# Patient Record
Sex: Female | Born: 1960 | Race: White | Hispanic: No | Marital: Married | Smoking: Never smoker
Health system: Southern US, Community
[De-identification: ages and names within clinical notes are randomized; demographics above are authoritative.]

## PROBLEM LIST (undated history)

## (undated) ENCOUNTER — Emergency Department (HOSPITAL_COMMUNITY): Admission: EM | Payer: 59 | Source: Home / Self Care

## (undated) DIAGNOSIS — J45909 Unspecified asthma, uncomplicated: Secondary | ICD-10-CM

## (undated) HISTORY — PX: ABLATION: SHX5711

## (undated) HISTORY — DX: Unspecified asthma, uncomplicated: J45.909

---

## 2002-08-11 ENCOUNTER — Inpatient Hospital Stay (HOSPITAL_COMMUNITY): Admission: AD | Admit: 2002-08-11 | Discharge: 2002-08-14 | Payer: Self-pay | Admitting: Obstetrics and Gynecology

## 2002-09-16 ENCOUNTER — Other Ambulatory Visit: Admission: RE | Admit: 2002-09-16 | Discharge: 2002-09-16 | Payer: Self-pay | Admitting: Obstetrics and Gynecology

## 2003-11-23 ENCOUNTER — Other Ambulatory Visit: Admission: RE | Admit: 2003-11-23 | Discharge: 2003-11-23 | Payer: Self-pay | Admitting: Obstetrics and Gynecology

## 2004-12-05 ENCOUNTER — Other Ambulatory Visit: Admission: RE | Admit: 2004-12-05 | Discharge: 2004-12-05 | Payer: Self-pay | Admitting: Obstetrics and Gynecology

## 2009-05-23 ENCOUNTER — Encounter: Admission: RE | Admit: 2009-05-23 | Discharge: 2009-05-23 | Payer: Self-pay | Admitting: Orthopedic Surgery

## 2010-07-27 NOTE — H&P (Signed)
NAME:  Taylor Sharp, Taylor Sharp                          ACCOUNT NO.:  [id number]   MEDICAL RECORD NO.:  [id number]                   PATIENT TYPE:  INP   LOCATION:  NA                                   FACILITY:  [facility]   PHYSICIAN:  Tracie Harrier, M.D.              DATE OF BIRTH:  03-16-1960   DATE OF ADMISSION:  08/11/2002  DATE OF DISCHARGE:                                HISTORY & PHYSICAL   HISTORY OF PRESENT ILLNESS:  The patient is a 50 year old female gravida 3  para 2 at 53 and one-seventh weeks gestation.  She is admitted for elective  induction of labor at term.  The patient's first pregnancy was complicated  by forceps delivery with her first baby in 49.  Due to this, ultrasound  was obtained at 38 weeks which showed a baby weighing 3600 grams.  Vertex  presentation was noted.  She is now admitted for induction of labor as  mentioned.   Her pregnancy was otherwise uneventful except for advanced maternal age.  She underwent an amniocentesis in the second trimester which was normal,  showing normal female chromosomes.   OBSTETRICAL LABORATORY DATA:  Maternal blood type O positive.  Rubella  immune.  Group B strep negative.  Glucola 79.   OBSTETRICAL HISTORY:  1. In 1989 a normal spontaneous vaginal delivery at term with forceps     delivery necessary, a female weighing 7 pounds 6 ounces.  2. In 1992 a normal spontaneous vaginal delivery at term, a female weighing     8 pounds 7 ounces.   SURGICAL HISTORY:  None.   MEDICAL HISTORY:  1. History of asthma - p.r.n. inhalers.  2. History of fractured pelvis age 63 - no surgery.   CURRENT MEDICATIONS:  Prenatal vitamins.   ALLERGIES:  None known.   PHYSICAL EXAMINATION:  VITAL SIGNS:  Stable, blood pressure 100/60,  temperature 97.8, fetal heart tones 140.  GENERAL:  She is a well-developed, well-nourished female in no acute  distress.  HEENT:  Within normal limits.  NECK:  Supple without adenopathy or thyromegaly.  HEART:  Regular rate and rhythm without murmur, gallop, or rub.  LUNGS:  Clear to auscultation.  BREAST:  Deferred.  ABDOMEN:  Soft and benign without masses, tenderness, or organomegaly.  A  gravid fundus is noted.  EXTREMITIES AND NEUROLOGIC:  Grossly normal.  PELVIC:  Last cervical exam:  Closed cervix, vertex presentation.   ADMITTING DIAGNOSES:  1. Intrauterine pregnancy at term.  2. Elective induction of labor.   PLAN:  1. Serial induction of labor.  2. Expect normal spontaneous vaginal delivery.  3. Postpartum tubal ligation after delivery at the patient's request.  Tracie Harrier, M.D.    REG/MEDQ  D:  08/11/2002  T:  08/11/2002  Job:  [id number]

## 2010-07-27 NOTE — Op Note (Signed)
   Taylor Sharp, Taylor Sharp                          ACCOUNT NO.:  [id number]   MEDICAL RECORD NO.:  [id number]                   PATIENT TYPE:  INP   LOCATION:  9123                                 [facility]ILITY:  [facility]   PHYSICIAN:  Duke Salvia. Marcelle Overlie, M.D.            DATE OF BIRTH:  Dec 22, 1960   DATE OF PROCEDURE:  08/12/2002  DATE OF DISCHARGE:                                 OPERATIVE REPORT   DELIVERY NOTE:  This patient had good progress to complete, stable FHR  throughout the second stage.  After pushing an hour and 15 minutes, was  noted to be +2.  The patient requested assistance due to exhaustion.  She  had good perineal epidural anesthesia, the bladder had been drained  previously, and the head was crowning.  The Kiwi vacuum was applied, two  traction efforts coordinated with maternal pushing to effect easy delivery  over a midline episiotomy.  The infant was suctioned, the cord clamped, and  Apgars were 9 and 9.  The infant was placed on the mother's abdomen.  The  placenta delivered spontaneously intact.  EBL was 300 mL.  The midline  episiotomy was repaired in a standard layered fashion with 3-0 Vicryl Rapide  sutures.  Mother and baby doing well at that point.                                               Richard M. Marcelle Overlie, M.D.    RMH/MEDQ  D:  08/12/2002  T:  08/13/2002  Job:  [id number]

## 2014-05-24 ENCOUNTER — Telehealth: Payer: Self-pay | Admitting: Cardiovascular Disease

## 2014-05-24 NOTE — Telephone Encounter (Signed)
Phone call from [facility],  She has been having some pinching chest pain for the past day. Not severe, Does not radiate, no shortness.  She is anxious about this + family hx of CAD Will see her tomorrow at 8:30 ECG. She may need lab work   Thayer Headings, Brooke Bonito., MD, [facility] 05/24/2014, 3:53 PM [address]  [place] Pager [phone]

## 2014-05-25 ENCOUNTER — Encounter: Payer: Self-pay | Admitting: Cardiovascular Disease

## 2014-05-25 ENCOUNTER — Ambulatory Visit (INDEPENDENT_AMBULATORY_CARE_PROVIDER_SITE_OTHER): Payer: BLUE CROSS/BLUE SHIELD | Admitting: Cardiovascular Disease

## 2014-05-25 ENCOUNTER — Telehealth: Payer: Self-pay | Admitting: Cardiovascular Disease

## 2014-05-25 VITALS — BP 112/78 | HR 70 | Ht 69.0 in | Wt 134.6 lb

## 2014-05-25 DIAGNOSIS — R0789 Other chest pain: Secondary | ICD-10-CM

## 2014-05-25 DIAGNOSIS — R5383 Other fatigue: Secondary | ICD-10-CM

## 2014-05-25 LAB — BASIC METABOLIC PANEL
BUN: 12 mg/dL (ref 6–23)
CO2: 27 mEq/L (ref 19–32)
Calcium: 9.2 mg/dL (ref 8.4–10.5)
Chloride: 107 mEq/L (ref 96–112)
Creatinine, Ser: 0.8 mg/dL (ref 0.40–1.20)
GFR: 79.68 mL/min (ref >60.00)
Glucose, Bld: 91 mg/dL (ref 70–99)
POTASSIUM: 4.1 meq/L (ref 3.5–5.1)
SODIUM: 139 meq/L (ref 135–145)

## 2014-05-25 LAB — LIPID PANEL
CHOL/HDL RATIO: 2
Cholesterol: 165 mg/dL (ref 0–200)
HDL: 67.2 mg/dL (ref >39.00)
LDL CALC: 85 mg/dL (ref 0–99)
NonHDL: 97.8
Triglycerides: 65 mg/dL (ref 0.0–149.0)
VLDL: 13 mg/dL (ref 0.0–40.0)

## 2014-05-25 LAB — HEPATIC FUNCTION PANEL
ALBUMIN: 4.2 g/dL (ref 3.5–5.2)
ALK PHOS: 45 U/L (ref 39–117)
ALT: 10 U/L (ref 0–35)
AST: 14 U/L (ref 0–37)
BILIRUBIN DIRECT: 0.2 mg/dL (ref 0.0–0.3)
TOTAL PROTEIN: 6.6 g/dL (ref 6.0–8.3)
Total Bilirubin: 1 mg/dL (ref 0.2–1.2)

## 2014-05-25 LAB — TSH: TSH: 2.04 u[IU]/mL (ref 0.35–4.50)

## 2014-05-25 NOTE — Telephone Encounter (Signed)
Troponin result was not available with other lab results; spoke with Freda Munro at [facility] main lab, spoke with Randell Loop, and [facility] at [place] and determined that sample was lost  Left message for patient that all lab results are normal except one sample that needs to be recollected and advised patient that I will call her tomorrow.

## 2014-05-25 NOTE — Progress Notes (Signed)
Cardiology Office Note   Date:  05/25/2014   ID:  Taylor Sharp, DOB 1961/01/08, MRN [id number]  PCP:  No primary care provider on file.  Cardiologist:   Margrett Kalb, Wonda Cheng, MD   Chief Complaint  Patient presents with  . Chest Pain   1. Chest pain   History of Present Illness: Taylor Sharp is a 54 y.o. female who presents for chest discomfort . Pearle started having chest pain 1-2 days ago.  The pain is a constant pinching pain, now its become a pressure.   Not related to exertion, taking a deep breath, movement. She was feeling poorly about 6 days .  Occurred after accupuncture.  Lots of stress at work and home. Slight cough, no fever No sputum .  Has continued to walk - does not affect the chest pressure Shes had some fatigue.     She works for [employer], broadcasting/film/video) Comes and goes slightly.   Has not tried motrin. Mild shortness of breath.  No sweats.    It is not associatted with any shortness of breath, diaphoresis, or dizziness     History reviewed. No pertinent past medical history.  Past Surgical History  Procedure Laterality Date  . Ablation       Current Outpatient Prescriptions  Medication Sig Dispense Refill  . Norethindrone Acetate-Ethinyl Estrad-FE (LOESTRIN 24 FE) 1-20 MG-MCG(24) tablet Take 1 tablet by mouth daily.     No current facility-administered medications for this visit.    Allergies:   Codeine and Prednisone    Social History:  The patient  reports that she has never smoked. She does not have any smokeless tobacco history on file. She reports that she drinks alcohol. She reports that she does not use illicit drugs.   Family History:  The patient's family history includes Breast cancer in her mother; CVA in her maternal grandmother; Heart attack in her father and paternal grandfather; Heart disease in her father; Hypertension in her mother.    ROS:  Please see the history of present illness.    Review of  Systems: Constitutional:  denies fever, chills, diaphoresis, appetite change and fatigue.  HEENT: denies photophobia, eye pain, redness, hearing loss, ear pain, congestion, sore throat, rhinorrhea, sneezing, neck pain, neck stiffness and tinnitus.  Respiratory: denies SOB, DOE, cough, chest tightness, and wheezing.  Cardiovascular: admits to chest pain,    Gastrointestinal: denies nausea, vomiting, abdominal pain, diarrhea, constipation, blood in stool.  Genitourinary: denies dysuria, urgency, frequency, hematuria, flank pain and difficulty urinating.  Musculoskeletal: denies  myalgias, back pain, joint swelling, arthralgias and gait problem.   Skin: denies pallor, rash and wound.  Neurological: denies dizziness, seizures, syncope, weakness, light-headedness, numbness and headaches.   Hematological: denies adenopathy, easy bruising, personal or family bleeding history.  Psychiatric/ Behavioral: denies suicidal ideation, mood changes, confusion, nervousness, sleep disturbance and agitation.       All other systems are reviewed and negative.    PHYSICAL EXAM: VS:  BP 112/78 mmHg  Pulse 70  Ht 5\' 9"  (1.753 m)  Wt 134 lb 9.6 oz (61.054 kg)  BMI 19.87 kg/m2 , BMI Body mass index is 19.87 kg/(m^2). GEN: Well nourished, well developed, in no acute distress HEENT: normal Neck: no JVD, carotid bruits, or masses Cardiac: RRR; no murmurs, rubs, or gallops,no edema , mild - moderate left sided chest wall tenderness  Respiratory:  clear to auscultation bilaterally, normal work of breathing GI: soft, nontender, nondistended, + BS MS: no deformity or  atrophy Skin: warm and dry, no rash Neuro:  Strength and sensation are intact Psych: normal   EKG:  EKG is ordered today. The ekg ordered today demonstrates NSR at 70, Right axis deviation    Recent Labs: No results found for requested labs within last 365 days.    Lipid Panel No results found for: CHOL, TRIG, HDL, CHOLHDL, VLDL, LDLCALC,  LDLDIRECT    Wt Readings from Last 3 Encounters:  05/25/14 134 lb 9.6 oz (61.054 kg)      Other studies Reviewed: Additional studies/ records that were reviewed today include: . Review of the above records demonstrates:    ASSESSMENT AND PLAN:  1.  Chest discomfort - She has left-sided chest wall tenderness. Her EKG is unremarkable. I think there is a low likelihood that this is acute coronary syndrome. As a precaution we will check a troponin level.  Will also check fasting lipids, liver enzymes, and basic metabolic profiles that she has a family history of premature coronary artery disease.  2. Fatigue - will check TSH , encouraged her to exercise   Current medicines are reviewed at length with the patient today.  The patient does not have concerns regarding medicines.  The following changes have been made:  no change  Labs/ tests ordered today include:  No orders of the defined types were placed in this encounter.     Disposition:   FU with me in 2 weeks.    Signed, Yumalay Circle, Wonda Cheng, MD  05/25/2014 9:20 AM    [facility] [place], [place], [place]  [address] Phone: [phone]; Fax: [phone]

## 2014-05-25 NOTE — Telephone Encounter (Signed)
New msg     Pt calling to see if lab results are available at this time.  Please return call.

## 2014-05-25 NOTE — Patient Instructions (Addendum)
Your physician recommends that you continue on your current medications as directed. Please refer to the Current Medication list given to you today.  Your physician recommends that you schedule a follow-up appointment in: 2 weeks - March 29 9:30 am  Your physician recommends that you have lab work:  TODAY - BMET, TSH, Troponin, LFTs, cholesterol

## 2014-05-26 ENCOUNTER — Telehealth: Payer: Self-pay | Admitting: *Deleted

## 2014-05-26 ENCOUNTER — Other Ambulatory Visit: Payer: BLUE CROSS/BLUE SHIELD | Admitting: *Deleted

## 2014-05-26 LAB — TROPONIN I

## 2014-05-26 NOTE — Telephone Encounter (Signed)
Spoke with patient and reviewed lab results with her. I explained that the troponin level was not completed. We are not certain as to why the level was not run; order was placed STAT for [facility] lab and drawn by Robert Bellow, phlebotomist here at [facility] and he reports was picked up by a carrier sometime after 10:00 am.  Yesterday afternoon, we were unable to identify why the sample was not processed.  Patient verbalized understanding and agreement to go to [facility] lab and get troponin level redrawn.  I have spoken with Shirlean Mylar, field agent for [employer] who is investigating the situation.

## 2014-05-26 NOTE — Telephone Encounter (Signed)
F/U       Pt would like to know if she is supposed to come in for additional labs today?    No order reflecting at this time.   Please return call.

## 2014-05-26 NOTE — Telephone Encounter (Signed)
Jessica from [facility] called to report that the Troponin I result is in = < 0.01. Dr. Acie Fredrickson notified and reviewed.

## 2014-05-26 NOTE — Addendum Note (Signed)
Addended by: Eulis Foster on: 05/26/2014 08:39 AM   Modules accepted: Orders

## 2014-05-30 ENCOUNTER — Telehealth: Payer: Self-pay | Admitting: Cardiovascular Disease

## 2014-05-30 NOTE — Telephone Encounter (Signed)
Spoke with patient and explained that no further tests ordered at this time.  Patient verbalized understanding and appreciation for the call

## 2014-05-30 NOTE — Telephone Encounter (Signed)
New Message    Patient would like to know if she needs to come in early for any kind of testing that may need to be done before the office visit on 06/07/14 @ 930am, please give patient a call back.  Thanks

## 2014-06-07 ENCOUNTER — Encounter: Payer: Self-pay | Admitting: Cardiovascular Disease

## 2014-06-07 ENCOUNTER — Ambulatory Visit (INDEPENDENT_AMBULATORY_CARE_PROVIDER_SITE_OTHER): Payer: BLUE CROSS/BLUE SHIELD | Admitting: Cardiovascular Disease

## 2014-06-07 VITALS — BP 112/72 | HR 84 | Ht 69.0 in | Wt 134.0 lb

## 2014-06-07 DIAGNOSIS — R0789 Other chest pain: Secondary | ICD-10-CM

## 2014-06-07 NOTE — Progress Notes (Signed)
Cardiology Office Note   Date:  06/07/2014   ID:  Taylor Sharp, DOB 1960-05-23, MRN [id number]  PCP:  No primary care provider on file.  Cardiologist:   Jett Kulzer, Wonda Cheng, MD   Chief Complaint  Patient presents with  . Follow-up    chest pain , palpitations   1. Chest pain   History of Present Illness: Taylor Sharp is a 54 y.o. female who presents for chest discomfort . Lakitha started having chest pain 1-2 days ago.  The pain is a constant pinching pain, now its become a pressure.   Not related to exertion, taking a deep breath, movement. She was feeling poorly about 6 days .  Occurred after accupuncture.  Lots of stress at work and home. Slight cough, no fever No sputum .  Has continued to walk - does not affect the chest pressure Shes had some fatigue.     She works for [employer], broadcasting/film/video) Comes and goes slightly.   Has not tried motrin. Mild shortness of breath.  No sweats.    It is not associatted with any shortness of breath, diaphoresis, or dizziness  June 07, 2014:  Taylor Sharp returns today for follow up of her episodes of CP. She is still having CP.  She is having some pleuretic CP.  Tried to exercise at the gym -  Did light weights -   No past medical history on file.  Past Surgical History  Procedure Laterality Date  . Ablation       Current Outpatient Prescriptions  Medication Sig Dispense Refill  . ALPRAZolam (XANAX) 0.5 MG tablet Take 0.5 mg by mouth 3 (three) times daily as needed. 1/2 OR 1 TABLET THREE TIMES DAILY BY MOUTH AS NEEDED  0  . azithromycin (ZITHROMAX) 250 MG tablet Take by mouth daily. TAKE BY MOUTH DAILY UNTIL COMPLETED  0  . Norethindrone Acetate-Ethinyl Estrad-FE (LOESTRIN 24 FE) 1-20 MG-MCG(24) tablet Take 1 tablet by mouth daily.     No current facility-administered medications for this visit.    Allergies:   Codeine and Prednisone    Social History:  The patient  reports that she has never smoked. She does  not have any smokeless tobacco history on file. She reports that she drinks alcohol. She reports that she does not use illicit drugs.   Family History:  The patient's family history includes Breast cancer in her mother; CVA in her maternal grandmother; Heart attack in her father and paternal grandfather; Heart disease in her father; Hypertension in her mother.    ROS:  Please see the history of present illness.    Review of Systems: Constitutional:  denies fever, chills, diaphoresis, appetite change and fatigue.  HEENT: denies photophobia, eye pain, redness, hearing loss, ear pain, congestion, sore throat, rhinorrhea, sneezing, neck pain, neck stiffness and tinnitus.  Respiratory: denies SOB, DOE, cough, chest tightness, and wheezing.  Cardiovascular: admits to chest pain,    Gastrointestinal: denies nausea, vomiting, abdominal pain, diarrhea, constipation, blood in stool.  Genitourinary: denies dysuria, urgency, frequency, hematuria, flank pain and difficulty urinating.  Musculoskeletal: denies  myalgias, back pain, joint swelling, arthralgias and gait problem.   Skin: denies pallor, rash and wound.  Neurological: denies dizziness, seizures, syncope, weakness, light-headedness, numbness and headaches.   Hematological: denies adenopathy, easy bruising, personal or family bleeding history.  Psychiatric/ Behavioral: denies suicidal ideation, mood changes, confusion, nervousness, sleep disturbance and agitation.       All other systems are reviewed and negative.  PHYSICAL EXAM: VS:  BP 112/72 mmHg  Pulse 84  Ht 5\' 9"  (1.753 m)  Wt 134 lb (60.782 kg)  BMI 19.78 kg/m2  SpO2 98% , BMI Body mass index is 19.78 kg/(m^2). GEN: Well nourished, well developed, in no acute distress HEENT: normal Neck: no JVD, carotid bruits, or masses Cardiac: RRR; no murmurs, rubs, or gallops,no edema , mild - moderate left sided chest wall tenderness , no specific point tenderness.  Respiratory:  clear  to auscultation bilaterally, normal work of breathing GI: soft, nontender, nondistended, + BS MS: no deformity or atrophy Skin: warm and dry, no rash Neuro:  Strength and sensation are intact Psych: normal   EKG:  EKG is not ordered today. The ekg ordered previously demonstrates NSR at 70, Right axis deviation    Recent Labs: 05/25/2014: ALT 10; BUN 12; Creatinine 0.80; Potassium 4.1; Sodium 139; TSH 2.04    Lipid Panel    Component Value Date/Time   CHOL 165 05/25/2014 0951   TRIG 65.0 05/25/2014 0951   HDL 67.20 05/25/2014 0951   CHOLHDL 2 05/25/2014 0951   VLDL 13.0 05/25/2014 0951   LDLCALC 85 05/25/2014 0951      Wt Readings from Last 3 Encounters:  06/07/14 134 lb (60.782 kg)  05/25/14 134 lb 9.6 oz (61.054 kg)      Other studies Reviewed: Additional studies/ records that were reviewed today include: . Review of the above records demonstrates:    ASSESSMENT AND PLAN:  1.  Chest discomfort - She has left-sided chest wall tenderness. Her EKG is unremarkable. I think there is a low likelihood that this is acute coronary syndrome.   During her last visit we checked a troponin level which was normal. She is developed a slight dry cough.  To further complete her workup, we'll get an echocardiogram and a treadmill test.   She may have very minimal mitral valve regurgitation.  Given her symptoms of vague chest discomfort, pleuritic chest pain, and various aches and pains, I'm concerned that she may have a collagen vascular disease.  I think she would benefit from a collagen vascular evaluation.  Will defer to Dr. Harrington Challenger for that.   2. Fatigue - TSH was normal  , encouraged her to exercise   Current medicines are reviewed at length with the patient today.  The patient does not have concerns regarding medicines.  The following changes have been made:  no change  Labs/ tests ordered today include:  No orders of the defined types were placed in this encounter.      Disposition:   FU with me in4-6  weeks.    Signed, Raequon Catanzaro, Wonda Cheng, MD  06/07/2014 9:33 AM    [facility], [place], [place]  [address] Phone: [phone]; Fax: [phone]

## 2014-06-07 NOTE — Patient Instructions (Signed)
Your physician recommends that you continue on your current medications as directed. Please refer to the Current Medication list given to you today.  Your physician has requested that you have an exercise tolerance test. For further information please visit [email or web address]. Please also follow instruction sheet, as given.  Your physician has requested that you have an echocardiogram. Echocardiography is a painless test that uses sound waves to create images of your heart. It provides your doctor with information about the size and shape of your heart and how well your heart's chambers and valves are working. This procedure takes approximately one hour. There are no restrictions for this procedure.  Your physician recommends that you schedule a follow-up appointment in: 4-6 weeks with Dr. Acie Fredrickson.

## 2014-06-13 ENCOUNTER — Ambulatory Visit (HOSPITAL_COMMUNITY)
Admission: RE | Admit: 2014-06-13 | Discharge: 2014-06-13 | Disposition: A | Discharge status: Home / Self Care | Payer: BLUE CROSS/BLUE SHIELD | Source: Ambulatory Visit | Attending: Cardiology | Admitting: Cardiology

## 2014-06-13 DIAGNOSIS — R0789 Other chest pain: Secondary | ICD-10-CM | POA: Diagnosis present

## 2014-06-13 DIAGNOSIS — R079 Chest pain, unspecified: Secondary | ICD-10-CM

## 2014-06-13 NOTE — Progress Notes (Addendum)
2D Echocardiogram Complete.  06/13/2014   Breya Cass, RDCS   Parasternal Images are difficult due to Lung and Rib artifact and interference.  Contrast not indicated for these images.

## 2014-06-29 ENCOUNTER — Telehealth (HOSPITAL_COMMUNITY): Payer: Self-pay

## 2014-06-29 NOTE — Telephone Encounter (Signed)
Encounter complete. 

## 2014-06-30 ENCOUNTER — Telehealth (HOSPITAL_COMMUNITY): Payer: Self-pay

## 2014-06-30 NOTE — Telephone Encounter (Signed)
Encounter complete. 

## 2014-07-01 ENCOUNTER — Ambulatory Visit (HOSPITAL_COMMUNITY): Payer: BLUE CROSS/BLUE SHIELD

## 2014-07-01 ENCOUNTER — Encounter (HOSPITAL_COMMUNITY): Payer: BLUE CROSS/BLUE SHIELD

## 2014-07-05 ENCOUNTER — Ambulatory Visit
Admission: RE | Admit: 2014-07-05 | Discharge: 2014-07-05 | Disposition: A | Discharge status: Home / Self Care | Payer: BLUE CROSS/BLUE SHIELD | Source: Ambulatory Visit | Attending: Family Medicine | Admitting: Family Medicine

## 2014-07-05 ENCOUNTER — Other Ambulatory Visit: Payer: Self-pay | Admitting: Family Medicine

## 2014-07-05 ENCOUNTER — Telehealth (HOSPITAL_COMMUNITY): Payer: Self-pay | Admitting: *Deleted

## 2014-07-05 DIAGNOSIS — R05 Cough: Secondary | ICD-10-CM

## 2014-07-05 DIAGNOSIS — R053 Chronic cough: Secondary | ICD-10-CM

## 2014-07-06 ENCOUNTER — Other Ambulatory Visit: Payer: Self-pay | Admitting: Cardiovascular Disease

## 2014-07-06 DIAGNOSIS — R079 Chest pain, unspecified: Secondary | ICD-10-CM

## 2014-07-11 ENCOUNTER — Telehealth: Payer: Self-pay | Admitting: Cardiovascular Disease

## 2014-07-11 DIAGNOSIS — R0789 Other chest pain: Secondary | ICD-10-CM

## 2014-07-11 DIAGNOSIS — R002 Palpitations: Secondary | ICD-10-CM

## 2014-07-11 NOTE — Telephone Encounter (Signed)
Left message for patient to call me back to discuss.  I included in the message the question of whether patient requests to have monitor placed before follow-up with Dr. Acie Fredrickson on 5/16.

## 2014-07-11 NOTE — Telephone Encounter (Signed)
New message        Pt is interested in wearing a monitor   please give pt a call

## 2014-07-11 NOTE — Telephone Encounter (Signed)
Spoke with patient who states she was in [place] this weekend and noted that after climbing a tall, steep flight of stairs that she felt heart pounding and chest palpitations.  Patient states these do not occur constantly, but occurs following certain activities.  She states she also noticed the symptoms after having her arms up overhead for an extended period of time while hanging clothes in a closet.  I advised her that I have spoken with Dr. Acie Fredrickson and he is in agreement to order a 30 day event monitor.  Patient requests 48 hour monitor and I advised her that since episodes do not occur constantly, that the 30 day monitor would be a better gauge of how burdensome these events are.  Patient verbalized understanding and agreement and is aware that someone from our office will call her this week to schedule.  We rescheduled her follow-up appointment with Dr. Acie Fredrickson for a date after GXT and monitor results will be available.

## 2014-07-19 ENCOUNTER — Ambulatory Visit: Payer: BLUE CROSS/BLUE SHIELD | Admitting: Cardiovascular Disease

## 2014-07-25 ENCOUNTER — Ambulatory Visit: Payer: BLUE CROSS/BLUE SHIELD | Admitting: Cardiovascular Disease

## 2014-07-29 ENCOUNTER — Telehealth (HOSPITAL_COMMUNITY): Payer: Self-pay | Admitting: *Deleted

## 2014-07-29 NOTE — Telephone Encounter (Signed)
Patient given detailed instructions per Myocardial Perfusion Study Information Sheet for test on 08/03/14 at 0945. Patient verbalized understanding. Peja Allender, Ranae Palms

## 2014-08-03 ENCOUNTER — Encounter: Payer: Self-pay | Admitting: *Deleted

## 2014-08-03 ENCOUNTER — Ambulatory Visit (HOSPITAL_COMMUNITY)
Admission: RE | Admit: 2014-08-03 | Discharge: 2014-08-03 | Disposition: A | Discharge status: Home / Self Care | Payer: BLUE CROSS/BLUE SHIELD | Source: Ambulatory Visit | Attending: Cardiovascular Disease | Admitting: Cardiovascular Disease

## 2014-08-03 DIAGNOSIS — R079 Chest pain, unspecified: Secondary | ICD-10-CM | POA: Diagnosis not present

## 2014-08-03 LAB — EXERCISE TOLERANCE TEST
CSEPED: 9 min
CSEPEDS: 15 s
CSEPHR: 97 %
CSEPPHR: 162 {beats}/min
Estimated workload: 10.4 METS
MPHR: 167 {beats}/min
RPE: 21120
Rest HR: 85 {beats}/min

## 2014-08-04 ENCOUNTER — Ambulatory Visit (INDEPENDENT_AMBULATORY_CARE_PROVIDER_SITE_OTHER): Payer: BLUE CROSS/BLUE SHIELD | Admitting: Emergency Medicine

## 2014-08-04 ENCOUNTER — Encounter: Payer: Self-pay | Admitting: Emergency Medicine

## 2014-08-04 VITALS — BP 104/64 | HR 64 | Ht 68.0 in | Wt 134.0 lb

## 2014-08-04 DIAGNOSIS — R0789 Other chest pain: Secondary | ICD-10-CM

## 2014-08-04 DIAGNOSIS — K219 Gastro-esophageal reflux disease without esophagitis: Secondary | ICD-10-CM | POA: Insufficient documentation

## 2014-08-04 DIAGNOSIS — J4520 Mild intermittent asthma, uncomplicated: Secondary | ICD-10-CM

## 2014-08-04 DIAGNOSIS — R058 Other specified cough: Secondary | ICD-10-CM | POA: Insufficient documentation

## 2014-08-04 DIAGNOSIS — J45909 Unspecified asthma, uncomplicated: Secondary | ICD-10-CM | POA: Insufficient documentation

## 2014-08-04 DIAGNOSIS — R05 Cough: Secondary | ICD-10-CM

## 2014-08-04 MED ORDER — ESOMEPRAZOLE MAGNESIUM 40 MG PO CPDR: DELAYED_RELEASE_CAPSULE | ORAL | Status: DC

## 2014-08-04 NOTE — Progress Notes (Signed)
Subjective:    Patient ID: Taylor Sharp, female    DOB: [birth year], 54 y.o.   MRN: [id number]  HPI 54 year old never smoker with minimal PMH w exception of some seasonal allergies and ? asthma. About 3 months ago she experienced L upper chest discomfort, could extend around her L breast to flank. She had some cough at the same time, non-productive. She was treated first with abx, then w pred later. She was then started back on pulmicort, but she does not use on a schedule. Finally she was started on prilosec 20mg  bid. She is off of this now. Both the cough and the CP have waxed and waned but are still present. She was then Evaluated by Dr Acie Fredrickson > reassuring TTE and cardiac stress test.    Chart review showed a normal CXR 07/05/14. Normal TTE on 06/13/14.  She notes that she stopped OCP's in 04/2014.    Review of Systems  Constitutional: Negative for fever and unexpected weight change.  HENT: Negative for congestion, dental problem, ear pain, nosebleeds, postnasal drip, rhinorrhea, sinus pressure, sneezing, sore throat and trouble swallowing.   Eyes: Negative for redness and itching.  Respiratory: Positive for cough and shortness of breath. Negative for chest tightness and wheezing.   Cardiovascular: Positive for chest pain and palpitations. Negative for leg swelling.  Gastrointestinal: Negative for nausea and vomiting.  Genitourinary: Negative for dysuria.  Musculoskeletal: Negative for joint swelling.  Skin: Negative for rash.  Neurological: Negative for headaches.  Hematological: Does not bruise/bleed easily.  Psychiatric/Behavioral: Negative for dysphoric mood. The patient is nervous/anxious.     Past Medical History  Diagnosis Date  . Asthma      Family History  Problem Relation Age of Onset  . Breast cancer Mother   . Hypertension Mother   . Heart attack Father   . Heart disease Father   . CVA Maternal Grandmother   . Heart attack Paternal Grandfather      History    Social History  . Marital Status: Married    Spouse Name: N/A  . Number of Children: N/A  . Years of Education: N/A   Occupational History  . Not on file.   Social History Main Topics  . Smoking status: Never Smoker   . Smokeless tobacco: Not on file  . Alcohol Use: 0.0 oz/week    0 Standard drinks or equivalent per week     Comment: OCCASIONALLY  . Drug Use: No  . Sexual Activity: Not on file   Other Topics Concern  . Not on file   Social History Narrative     Allergies  Allergen Reactions  . Codeine     "MAKES ME CRAZY"  . Prednisone     "MAKES ME KIND OF CRAZY"     Outpatient Prescriptions Prior to Visit  Medication Sig Dispense Refill  . ALPRAZolam (XANAX) 0.5 MG tablet Take 0.5 mg by mouth 3 (three) times daily as needed. 1/2 OR 1 TABLET THREE TIMES DAILY BY MOUTH AS NEEDED  0  . azithromycin (ZITHROMAX) 250 MG tablet Take by mouth daily. TAKE BY MOUTH DAILY UNTIL COMPLETED  0  . Norethindrone Acetate-Ethinyl Estrad-FE (LOESTRIN 24 FE) 1-20 MG-MCG(24) tablet Take 1 tablet by mouth daily.     No facility-administered medications prior to visit.         Objective:   Physical Exam Filed Vitals:   08/04/14 1559 08/04/14 1600  BP:  104/64  Pulse:  64  Height: 5\' 8"  (1.727 m)  Weight: 134 lb (60.782 kg)   SpO2:  97%   Gen: Pleasant, well-nourished, in no distress,  normal affect  ENT: No lesions,  mouth clear,  oropharynx clear, no postnasal drip  Neck: No JVD, no TMG, no carotid bruits  Lungs: No use of accessory muscles, no dullness to percussion, clear without rales or rhonchi  Cardiovascular: RRR, heart sounds normal, no murmur or gallops, no peripheral edema  Musculoskeletal: No deformities, no cyanosis or clubbing  Neuro: alert, non focal  Skin: Warm, no lesions or rashes      Assessment & Plan:  Chest discomfort Based on her history and the fact that this is been associated with cough and possibly poorly controlled asthma I suspect  that she does have GERD. They tended to treat this with omeprazole and I question whether the dosing was inadequate. We will treat more aggressively as above   Intrinsic asthma Suspect that her cough is at least in part related to upper airway irritation probably from GERD with a possible contribution of allergic rhinitis. She may also have true asthma that could be similarly irritated. I believe she needs full pulmonary function testing in order to sort out the contribution of lower airways disease. We will continue albuterol as needed until his questions answered   GERD (gastroesophageal reflux disease) She failed Prilosec 20 mg twice a day. I will start her on Nexium 40 mg twice a day for 1 week and then decrease to daily   Upper airway cough syndrome Suspect large component of her cough is related to an upper airway source. She reports symptoms consistent with GERD. I will treat her GERD empirically as above. Will also start loratadine 10 mg daily empirically to see if she benefits.

## 2014-08-04 NOTE — Patient Instructions (Signed)
Please start Nexium 40 mg. Take this twice a day either an hour before or an hour after eating for the first week, then change to once a day until we follow up Please start loratadine 10 mg daily every day until we follow up We will perform full pulmonary function testing Stop Pulmicort Continue to have albuterol available to use 2 puffs if needed for shortness of breath or wheezing Follow with Dr Lamonte Sakai in 3-4 weeks

## 2014-08-04 NOTE — Assessment & Plan Note (Signed)
Suspect that her cough is at least in part related to upper airway irritation probably from GERD with a possible contribution of allergic rhinitis. She may also have true asthma that could be similarly irritated. I believe she needs full pulmonary function testing in order to sort out the contribution of lower airways disease. We will continue albuterol as needed until his questions answered

## 2014-08-04 NOTE — Assessment & Plan Note (Signed)
Based on her history and the fact that this is been associated with cough and possibly poorly controlled asthma I suspect that she does have GERD. They tended to treat this with omeprazole and I question whether the dosing was inadequate. We will treat more aggressively as above

## 2014-08-04 NOTE — Assessment & Plan Note (Signed)
Suspect large component of her cough is related to an upper airway source. She reports symptoms consistent with GERD. I will treat her GERD empirically as above. Will also start loratadine 10 mg daily empirically to see if she benefits.

## 2014-08-04 NOTE — Assessment & Plan Note (Signed)
She failed Prilosec 20 mg twice a day. I will start her on Nexium 40 mg twice a day for 1 week and then decrease to daily

## 2014-08-11 ENCOUNTER — Ambulatory Visit: Payer: BLUE CROSS/BLUE SHIELD | Admitting: Cardiovascular Disease

## 2014-08-18 ENCOUNTER — Ambulatory Visit (HOSPITAL_COMMUNITY)
Admission: RE | Admit: 2014-08-18 | Discharge: 2014-08-18 | Disposition: A | Discharge status: Home / Self Care | Payer: BLUE CROSS/BLUE SHIELD | Source: Ambulatory Visit | Attending: Emergency Medicine | Admitting: Emergency Medicine

## 2014-08-18 DIAGNOSIS — J4520 Mild intermittent asthma, uncomplicated: Secondary | ICD-10-CM | POA: Diagnosis present

## 2014-08-18 LAB — PULMONARY FUNCTION TEST
DL/VA % PRED: 71 %
DL/VA: 3.81 ml/min/mmHg/L
DLCO UNC % PRED: 67 %
DLCO unc: 21.03 ml/min/mmHg
FEF 25-75 Post: 3.87 L/sec
FEF 25-75 Pre: 3.25 L/sec
FEF2575-%Change-Post: 19 %
FEF2575-%PRED-PRE: 111 %
FEF2575-%Pred-Post: 132 %
FEV1-%CHANGE-POST: 4 %
FEV1-%PRED-POST: 104 %
FEV1-%PRED-PRE: 100 %
FEV1-POST: 3.38 L
FEV1-Pre: 3.22 L
FEV1FVC-%Change-Post: 2 %
FEV1FVC-%Pred-Pre: 102 %
FEV6-%CHANGE-POST: 2 %
FEV6-%Pred-Post: 102 %
FEV6-%Pred-Pre: 100 %
FEV6-Post: 4.09 L
FEV6-Pre: 3.99 L
FEV6FVC-%PRED-POST: 103 %
FEV6FVC-%Pred-Pre: 103 %
FVC-%Change-Post: 2 %
FVC-%PRED-POST: 99 %
FVC-%PRED-PRE: 97 %
FVC-POST: 4.09 L
FVC-Pre: 3.99 L
POST FEV1/FVC RATIO: 83 %
PRE FEV1/FVC RATIO: 81 %
PRE FEV6/FVC RATIO: 100 %
Post FEV6/FVC ratio: 100 %
RV % PRED: 84 %
RV: 1.79 L
TLC % PRED: 99 %
TLC: 5.79 L

## 2014-08-18 MED ORDER — ALBUTEROL SULFATE (2.5 MG/3ML) 0.083% IN NEBU
2.5000 mg | INHALATION_SOLUTION | Freq: Once | RESPIRATORY_TRACT | Status: AC
  Administered 2014-08-18: 2.5 mg via RESPIRATORY_TRACT

## 2014-09-02 ENCOUNTER — Encounter: Payer: Self-pay | Admitting: Emergency Medicine

## 2014-09-02 ENCOUNTER — Ambulatory Visit (INDEPENDENT_AMBULATORY_CARE_PROVIDER_SITE_OTHER): Payer: BLUE CROSS/BLUE SHIELD | Admitting: Emergency Medicine

## 2014-09-02 VITALS — BP 110/72 | HR 84 | Wt 133.0 lb

## 2014-09-02 DIAGNOSIS — R058 Other specified cough: Secondary | ICD-10-CM

## 2014-09-02 DIAGNOSIS — R05 Cough: Secondary | ICD-10-CM | POA: Diagnosis not present

## 2014-09-02 NOTE — Progress Notes (Signed)
Subjective:    Patient ID: Taylor Sharp, female    DOB: 04/01/1960, 54 y.o.   MRN: [id number]  Asthma She complains of cough and shortness of breath. There is no wheezing. Associated symptoms include chest pain. Pertinent negatives include no ear pain, fever, headaches, postnasal drip, rhinorrhea, sneezing, sore throat or trouble swallowing. Her past medical history is significant for asthma.   54 year old never smoker with minimal PMH w exception of some seasonal allergies and ? asthma. About 3 months ago she experienced L upper chest discomfort, could extend around her L breast to flank. She had some cough at the same time, non-productive. She was treated first with abx, then w pred later. She was then started back on pulmicort, but she does not use on a schedule. Finally she was started on prilosec 20mg  bid. She is off of this now. Both the cough and the CP have waxed and waned but are still present. She was then Evaluated by Dr Acie Fredrickson > reassuring TTE and cardiac stress test.                                                                                                                                                                  Chart review showed a normal CXR 07/05/14. Normal TTE on 06/13/14.  She notes that she stopped OCP's in 04/2014.   ROV 09/02/14 -- follow-up visit for cough, just discomfort that I suspect is related to GERD. She underwent full pulmonary function testing 08/18/14 that have reviewed personally today. This shows normal airflows without any evidence of asthma.  She started nexium and loratadine, has done better > her cough has resolved.  She has been active.    Review of Systems  Constitutional: Negative for fever and unexpected weight change.  HENT: Negative for congestion, dental problem, ear pain, nosebleeds, postnasal drip, rhinorrhea, sinus pressure, sneezing, sore throat and trouble swallowing.   Eyes: Negative for redness and itching.  Respiratory: Positive for  cough and shortness of breath. Negative for chest tightness and wheezing.   Cardiovascular: Positive for chest pain and palpitations. Negative for leg swelling.  Gastrointestinal: Negative for nausea and vomiting.  Genitourinary: Negative for dysuria.  Musculoskeletal: Negative for joint swelling.  Skin: Negative for rash.  Neurological: Negative for headaches.  Hematological: Does not bruise/bleed easily.  Psychiatric/Behavioral: Negative for dysphoric mood. The patient is nervous/anxious.     Past Medical History  Diagnosis Date  . Asthma      Family History  Problem Relation Age of Onset  . Breast cancer Mother   . Hypertension Mother   . Heart attack Father   . Heart disease Father   . CVA Maternal Grandmother   . Heart attack Paternal Grandfather      Social History  Social History  . Marital Status: Married    Spouse Name: N/A  . Number of Children: N/A  . Years of Education: N/A   Occupational History  . Not on file.   Social History Main Topics  . Smoking status: Never Smoker   . Smokeless tobacco: Not on file  . Alcohol Use: 0.0 oz/week    0 Standard drinks or equivalent per week     Comment: OCCASIONALLY  . Drug Use: No  . Sexual Activity: Not on file   Other Topics Concern  . Not on file   Social History Narrative     Allergies  Allergen Reactions  . Codeine     "MAKES ME CRAZY"  . Prednisone     "MAKES ME KIND OF CRAZY"     Outpatient Prescriptions Prior to Visit  Medication Sig Dispense Refill  . ALPRAZolam (XANAX) 0.5 MG tablet Take 0.5 mg by mouth 3 (three) times daily as needed. 1/2 OR 1 TABLET THREE TIMES DAILY BY MOUTH AS NEEDED  0  . esomeprazole (NEXIUM) 40 MG capsule Take 2 tablets for 1 week, then take 1 tablet daily 60 capsule 5   No facility-administered medications prior to visit.         Objective:   Physical Exam Filed Vitals:   09/02/14 1640  BP: 110/72  Pulse: 84  Weight: 60.328 kg (133 lb)  SpO2: 97%   Gen:  Pleasant, well-nourished, in no distress,  normal affect  ENT: No lesions,  mouth clear,  oropharynx clear, no postnasal drip  Neck: No JVD, no TMG, no carotid bruits  Lungs: No use of accessory muscles, no dullness to percussion, clear without rales or rhonchi  Cardiovascular: RRR, heart sounds normal, no murmur or gallops, no peripheral edema  Musculoskeletal: No deformities, no cyanosis or clubbing  Neuro: alert, non focal  Skin: Warm, no lesions or rashes      Assessment & Plan:  Upper airway cough syndrome Your pulmonary function testing does not show any evidence for asthma You may try stopping either your nexium or your loratadine (don't do both at the same time) to see how this impacts your cough.  Follow with Dr Lamonte Sakai if your cough or breathing change or worsen.

## 2014-09-02 NOTE — Patient Instructions (Signed)
Your pulmonary function testing does not show any evidence for asthma You may try stopping either your nexium or your loratadine (don't do both at the same time) to see how this impacts your cough.  Follow with Dr Lamonte Sakai if your cough or breathing change or worsen.

## 2015-08-16 NOTE — Assessment & Plan Note (Signed)
Your pulmonary function testing does not show any evidence for asthma You may try stopping either your nexium or your loratadine (don't do both at the same time) to see how this impacts your cough.  Follow with Dr Arnav Cregg if your cough or breathing change or worsen.  

## 2015-09-22 DIAGNOSIS — H5212 Myopia, left eye: Secondary | ICD-10-CM | POA: Diagnosis not present

## 2016-01-08 DIAGNOSIS — M9905 Segmental and somatic dysfunction of pelvic region: Secondary | ICD-10-CM | POA: Diagnosis not present

## 2016-01-08 DIAGNOSIS — M9904 Segmental and somatic dysfunction of sacral region: Secondary | ICD-10-CM | POA: Diagnosis not present

## 2016-01-08 DIAGNOSIS — M9902 Segmental and somatic dysfunction of thoracic region: Secondary | ICD-10-CM | POA: Diagnosis not present

## 2016-01-08 DIAGNOSIS — M9903 Segmental and somatic dysfunction of lumbar region: Secondary | ICD-10-CM | POA: Diagnosis not present

## 2016-01-10 DIAGNOSIS — R194 Change in bowel habit: Secondary | ICD-10-CM | POA: Diagnosis not present

## 2016-01-16 DIAGNOSIS — M9903 Segmental and somatic dysfunction of lumbar region: Secondary | ICD-10-CM | POA: Diagnosis not present

## 2016-01-16 DIAGNOSIS — M9902 Segmental and somatic dysfunction of thoracic region: Secondary | ICD-10-CM | POA: Diagnosis not present

## 2016-01-16 DIAGNOSIS — M9904 Segmental and somatic dysfunction of sacral region: Secondary | ICD-10-CM | POA: Diagnosis not present

## 2016-01-16 DIAGNOSIS — M9905 Segmental and somatic dysfunction of pelvic region: Secondary | ICD-10-CM | POA: Diagnosis not present

## 2016-01-25 DIAGNOSIS — M9902 Segmental and somatic dysfunction of thoracic region: Secondary | ICD-10-CM | POA: Diagnosis not present

## 2016-01-25 DIAGNOSIS — M9905 Segmental and somatic dysfunction of pelvic region: Secondary | ICD-10-CM | POA: Diagnosis not present

## 2016-01-25 DIAGNOSIS — M9904 Segmental and somatic dysfunction of sacral region: Secondary | ICD-10-CM | POA: Diagnosis not present

## 2016-01-25 DIAGNOSIS — M9903 Segmental and somatic dysfunction of lumbar region: Secondary | ICD-10-CM | POA: Diagnosis not present

## 2016-02-09 DIAGNOSIS — M9903 Segmental and somatic dysfunction of lumbar region: Secondary | ICD-10-CM | POA: Diagnosis not present

## 2016-02-09 DIAGNOSIS — M9902 Segmental and somatic dysfunction of thoracic region: Secondary | ICD-10-CM | POA: Diagnosis not present

## 2016-02-09 DIAGNOSIS — M9904 Segmental and somatic dysfunction of sacral region: Secondary | ICD-10-CM | POA: Diagnosis not present

## 2016-02-09 DIAGNOSIS — M9905 Segmental and somatic dysfunction of pelvic region: Secondary | ICD-10-CM | POA: Diagnosis not present

## 2016-02-28 DIAGNOSIS — Z8371 Family history of colonic polyps: Secondary | ICD-10-CM | POA: Diagnosis not present

## 2016-02-28 DIAGNOSIS — Z1211 Encounter for screening for malignant neoplasm of colon: Secondary | ICD-10-CM | POA: Diagnosis not present

## 2016-03-06 DIAGNOSIS — Z1231 Encounter for screening mammogram for malignant neoplasm of breast: Secondary | ICD-10-CM | POA: Diagnosis not present

## 2016-03-06 DIAGNOSIS — Z6820 Body mass index (BMI) 20.0-20.9, adult: Secondary | ICD-10-CM | POA: Diagnosis not present

## 2016-03-06 DIAGNOSIS — Z01419 Encounter for gynecological examination (general) (routine) without abnormal findings: Secondary | ICD-10-CM | POA: Diagnosis not present

## 2016-03-21 DIAGNOSIS — Z23 Encounter for immunization: Secondary | ICD-10-CM | POA: Diagnosis not present

## 2016-04-19 DIAGNOSIS — L739 Follicular disorder, unspecified: Secondary | ICD-10-CM | POA: Diagnosis not present

## 2016-04-19 DIAGNOSIS — L0102 Bockhart's impetigo: Secondary | ICD-10-CM | POA: Diagnosis not present

## 2016-04-22 DIAGNOSIS — K30 Functional dyspepsia: Secondary | ICD-10-CM | POA: Diagnosis not present

## 2016-04-22 DIAGNOSIS — L739 Follicular disorder, unspecified: Secondary | ICD-10-CM | POA: Diagnosis not present

## 2016-05-28 DIAGNOSIS — N951 Menopausal and female climacteric states: Secondary | ICD-10-CM | POA: Diagnosis not present

## 2016-06-07 DIAGNOSIS — M9904 Segmental and somatic dysfunction of sacral region: Secondary | ICD-10-CM | POA: Diagnosis not present

## 2016-06-07 DIAGNOSIS — M9902 Segmental and somatic dysfunction of thoracic region: Secondary | ICD-10-CM | POA: Diagnosis not present

## 2016-06-07 DIAGNOSIS — M9903 Segmental and somatic dysfunction of lumbar region: Secondary | ICD-10-CM | POA: Diagnosis not present

## 2016-06-07 DIAGNOSIS — M9905 Segmental and somatic dysfunction of pelvic region: Secondary | ICD-10-CM | POA: Diagnosis not present

## 2016-10-31 DIAGNOSIS — M9905 Segmental and somatic dysfunction of pelvic region: Secondary | ICD-10-CM | POA: Diagnosis not present

## 2016-10-31 DIAGNOSIS — M25552 Pain in left hip: Secondary | ICD-10-CM | POA: Diagnosis not present

## 2016-10-31 DIAGNOSIS — M9904 Segmental and somatic dysfunction of sacral region: Secondary | ICD-10-CM | POA: Diagnosis not present

## 2016-10-31 DIAGNOSIS — M9903 Segmental and somatic dysfunction of lumbar region: Secondary | ICD-10-CM | POA: Diagnosis not present

## 2016-11-12 DIAGNOSIS — M9905 Segmental and somatic dysfunction of pelvic region: Secondary | ICD-10-CM | POA: Diagnosis not present

## 2016-11-12 DIAGNOSIS — M25552 Pain in left hip: Secondary | ICD-10-CM | POA: Diagnosis not present

## 2016-11-12 DIAGNOSIS — M9903 Segmental and somatic dysfunction of lumbar region: Secondary | ICD-10-CM | POA: Diagnosis not present

## 2016-11-12 DIAGNOSIS — M9904 Segmental and somatic dysfunction of sacral region: Secondary | ICD-10-CM | POA: Diagnosis not present

## 2016-11-18 DIAGNOSIS — M9904 Segmental and somatic dysfunction of sacral region: Secondary | ICD-10-CM | POA: Diagnosis not present

## 2016-11-18 DIAGNOSIS — M9905 Segmental and somatic dysfunction of pelvic region: Secondary | ICD-10-CM | POA: Diagnosis not present

## 2016-11-18 DIAGNOSIS — M9903 Segmental and somatic dysfunction of lumbar region: Secondary | ICD-10-CM | POA: Diagnosis not present

## 2016-11-18 DIAGNOSIS — M25552 Pain in left hip: Secondary | ICD-10-CM | POA: Diagnosis not present

## 2016-11-22 DIAGNOSIS — M9904 Segmental and somatic dysfunction of sacral region: Secondary | ICD-10-CM | POA: Diagnosis not present

## 2016-11-22 DIAGNOSIS — M9903 Segmental and somatic dysfunction of lumbar region: Secondary | ICD-10-CM | POA: Diagnosis not present

## 2016-11-22 DIAGNOSIS — M9905 Segmental and somatic dysfunction of pelvic region: Secondary | ICD-10-CM | POA: Diagnosis not present

## 2016-11-22 DIAGNOSIS — M25552 Pain in left hip: Secondary | ICD-10-CM | POA: Diagnosis not present

## 2016-11-25 DIAGNOSIS — M25552 Pain in left hip: Secondary | ICD-10-CM | POA: Diagnosis not present

## 2016-11-25 DIAGNOSIS — M9903 Segmental and somatic dysfunction of lumbar region: Secondary | ICD-10-CM | POA: Diagnosis not present

## 2016-11-25 DIAGNOSIS — M9904 Segmental and somatic dysfunction of sacral region: Secondary | ICD-10-CM | POA: Diagnosis not present

## 2016-11-25 DIAGNOSIS — M9905 Segmental and somatic dysfunction of pelvic region: Secondary | ICD-10-CM | POA: Diagnosis not present

## 2016-12-05 DIAGNOSIS — M25552 Pain in left hip: Secondary | ICD-10-CM | POA: Diagnosis not present

## 2016-12-05 DIAGNOSIS — M9903 Segmental and somatic dysfunction of lumbar region: Secondary | ICD-10-CM | POA: Diagnosis not present

## 2016-12-05 DIAGNOSIS — M9904 Segmental and somatic dysfunction of sacral region: Secondary | ICD-10-CM | POA: Diagnosis not present

## 2016-12-05 DIAGNOSIS — M9905 Segmental and somatic dysfunction of pelvic region: Secondary | ICD-10-CM | POA: Diagnosis not present

## 2016-12-13 DIAGNOSIS — M7918 Myalgia, other site: Secondary | ICD-10-CM | POA: Diagnosis not present

## 2016-12-13 DIAGNOSIS — M9905 Segmental and somatic dysfunction of pelvic region: Secondary | ICD-10-CM | POA: Diagnosis not present

## 2016-12-13 DIAGNOSIS — M9904 Segmental and somatic dysfunction of sacral region: Secondary | ICD-10-CM | POA: Diagnosis not present

## 2016-12-13 DIAGNOSIS — M9903 Segmental and somatic dysfunction of lumbar region: Secondary | ICD-10-CM | POA: Diagnosis not present

## 2016-12-16 DIAGNOSIS — J0100 Acute maxillary sinusitis, unspecified: Secondary | ICD-10-CM | POA: Diagnosis not present

## 2016-12-16 DIAGNOSIS — B349 Viral infection, unspecified: Secondary | ICD-10-CM | POA: Diagnosis not present

## 2016-12-18 DIAGNOSIS — M9905 Segmental and somatic dysfunction of pelvic region: Secondary | ICD-10-CM | POA: Diagnosis not present

## 2016-12-18 DIAGNOSIS — M9904 Segmental and somatic dysfunction of sacral region: Secondary | ICD-10-CM | POA: Diagnosis not present

## 2016-12-18 DIAGNOSIS — M9903 Segmental and somatic dysfunction of lumbar region: Secondary | ICD-10-CM | POA: Diagnosis not present

## 2016-12-18 DIAGNOSIS — M7918 Myalgia, other site: Secondary | ICD-10-CM | POA: Diagnosis not present

## 2016-12-26 DIAGNOSIS — M9905 Segmental and somatic dysfunction of pelvic region: Secondary | ICD-10-CM | POA: Diagnosis not present

## 2016-12-26 DIAGNOSIS — M9904 Segmental and somatic dysfunction of sacral region: Secondary | ICD-10-CM | POA: Diagnosis not present

## 2016-12-26 DIAGNOSIS — M9903 Segmental and somatic dysfunction of lumbar region: Secondary | ICD-10-CM | POA: Diagnosis not present

## 2016-12-26 DIAGNOSIS — M7918 Myalgia, other site: Secondary | ICD-10-CM | POA: Diagnosis not present

## 2017-01-09 DIAGNOSIS — M9905 Segmental and somatic dysfunction of pelvic region: Secondary | ICD-10-CM | POA: Diagnosis not present

## 2017-01-09 DIAGNOSIS — M9904 Segmental and somatic dysfunction of sacral region: Secondary | ICD-10-CM | POA: Diagnosis not present

## 2017-01-09 DIAGNOSIS — M9903 Segmental and somatic dysfunction of lumbar region: Secondary | ICD-10-CM | POA: Diagnosis not present

## 2017-01-09 DIAGNOSIS — M7918 Myalgia, other site: Secondary | ICD-10-CM | POA: Diagnosis not present

## 2017-01-23 DIAGNOSIS — M9903 Segmental and somatic dysfunction of lumbar region: Secondary | ICD-10-CM | POA: Diagnosis not present

## 2017-01-23 DIAGNOSIS — M9905 Segmental and somatic dysfunction of pelvic region: Secondary | ICD-10-CM | POA: Diagnosis not present

## 2017-01-23 DIAGNOSIS — M7918 Myalgia, other site: Secondary | ICD-10-CM | POA: Diagnosis not present

## 2017-01-23 DIAGNOSIS — M9904 Segmental and somatic dysfunction of sacral region: Secondary | ICD-10-CM | POA: Diagnosis not present

## 2017-01-27 DIAGNOSIS — H5212 Myopia, left eye: Secondary | ICD-10-CM | POA: Diagnosis not present

## 2017-02-13 DIAGNOSIS — M7918 Myalgia, other site: Secondary | ICD-10-CM | POA: Diagnosis not present

## 2017-02-13 DIAGNOSIS — M9905 Segmental and somatic dysfunction of pelvic region: Secondary | ICD-10-CM | POA: Diagnosis not present

## 2017-02-13 DIAGNOSIS — M9903 Segmental and somatic dysfunction of lumbar region: Secondary | ICD-10-CM | POA: Diagnosis not present

## 2017-02-13 DIAGNOSIS — M9904 Segmental and somatic dysfunction of sacral region: Secondary | ICD-10-CM | POA: Diagnosis not present

## 2017-02-18 ENCOUNTER — Ambulatory Visit (INDEPENDENT_AMBULATORY_CARE_PROVIDER_SITE_OTHER): Payer: BLUE CROSS/BLUE SHIELD | Admitting: Sports Medicine

## 2017-02-18 ENCOUNTER — Ambulatory Visit: Payer: BLUE CROSS/BLUE SHIELD | Admitting: Sports Medicine

## 2017-02-18 ENCOUNTER — Encounter: Payer: Self-pay | Admitting: Sports Medicine

## 2017-02-18 ENCOUNTER — Ambulatory Visit: Payer: Self-pay

## 2017-02-18 VITALS — BP 109/69 | Ht 69.0 in | Wt 135.0 lb

## 2017-02-18 DIAGNOSIS — M249 Joint derangement, unspecified: Secondary | ICD-10-CM | POA: Insufficient documentation

## 2017-02-18 DIAGNOSIS — M7072 Other bursitis of hip, left hip: Secondary | ICD-10-CM | POA: Diagnosis not present

## 2017-02-18 DIAGNOSIS — M25552 Pain in left hip: Secondary | ICD-10-CM

## 2017-02-18 DIAGNOSIS — M7070 Other bursitis of hip, unspecified hip: Secondary | ICD-10-CM | POA: Insufficient documentation

## 2017-02-18 MED ORDER — NITROGLYCERIN 0.2 MG/HR TD PT24: MEDICATED_PATCH | TRANSDERMAL | 0 refills | Status: DC

## 2017-02-18 NOTE — Progress Notes (Signed)
  Taylor Sharp - 56 y.o. female MRN [id number]  Date of birth: 08/30/1960  SUBJECTIVE:  Including CC & ROS.  No chief complaint on file. Taylor Sharp is a 56 year old woman with PMH seasonal allergies who previously participated in ballet for at least 20 years and plays tennis now who presents with left hip pain. The pain extends from her left upper buttocks to the mid buttocks, she also has pain in the left groin. The pain flairs when she pivots towards the left while playing tennis or gets out of her car. Sometimes the pain becomes worse at night. The pain has been ongoing for 10 years. She has had the left hip injected through orthopedics in the past, last was this helped to improve her pain. The hip pain is associated with left knee pain. Both her left hip and left knee feel lax and unstable.    HISTORY: Past Medical, Surgical, Social, and Family History Reviewed & Updated per EMR.   Pertinent Historical Findings include: Seasonal allergies, tennis player   ROS:  No sciatica/ no weakness in left leg/ no numbness  DATA REVIEWED:  PHYSICAL EXAM:  Thin, fit appearing W F in NAD VS: BP:109/69  HR: bpm  TEMP: ( )  RESP:   HT:5\' 9"  (175.3 cm)   WT:135 lb (61.2 kg)  BMI:19.93 PHYSICAL EXAM: General: well appearing, no acute distress   Left hip: Decreased strength of abductors. Good strength with adduction and flexion. Hyperflexibility with flexion and internal rotation. Hypermobile on FABER Neg FADIR  Excellent IR and ER  Right hip: Strength of abductors, adductors and flexors intact. Hyperflexibility with flexion and internal rotation.   Hypermbobile on FABER  Knees: no joint line tenderness, McMurrays neg/ Valgus and Varus did not reproduce the pain, negative Lachman test / neg theassaly  ASSESSMENT & PLAN: See problem based charting & AVS for pt instructions.  I observed and examined the patient with the resident and agree with assessment and plan.  Note reviewed and modified  by me. Stefanie Libel, MD

## 2017-02-18 NOTE — Assessment & Plan Note (Addendum)
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

## 2017-02-18 NOTE — Patient Instructions (Signed)

## 2017-02-18 NOTE — Assessment & Plan Note (Addendum)
Assessment: Point of care ultrasound Left lateral hip revealed a hypoechoic bursa at the insertion of the piriformis into posterior facet of greater trochanter Mild hypoechoic change between the Gluteus medius and minimus tendon attachments at greater trochanterI Femoral head appears normal  Impression: ultrasound consistent with greater trochanteric bursitis  Ultrasound and interpretation by Wolfgang Phoenix. Fields, MD  . This injury has probably lead to compensatory avoidance of hip abduction and secondarily weakening of that muscle group.   Plan:  - point of care ultrasound performed today  - topical NTG  - provided hip abductor exercise instructions  - follow up in about 6 weeks  We can do CSI if pain persists

## 2017-03-25 ENCOUNTER — Other Ambulatory Visit: Payer: Self-pay | Admitting: Sports Medicine

## 2017-03-25 ENCOUNTER — Other Ambulatory Visit: Payer: Self-pay | Admitting: *Deleted

## 2017-03-25 MED ORDER — NITROGLYCERIN 0.2 MG/HR TD PT24: MEDICATED_PATCH | TRANSDERMAL | 1 refills | Status: DC

## 2017-04-01 ENCOUNTER — Ambulatory Visit: Payer: BLUE CROSS/BLUE SHIELD | Admitting: Sports Medicine

## 2017-04-16 DIAGNOSIS — Z01419 Encounter for gynecological examination (general) (routine) without abnormal findings: Secondary | ICD-10-CM | POA: Diagnosis not present

## 2017-04-16 DIAGNOSIS — Z6820 Body mass index (BMI) 20.0-20.9, adult: Secondary | ICD-10-CM | POA: Diagnosis not present

## 2017-04-30 DIAGNOSIS — Z1231 Encounter for screening mammogram for malignant neoplasm of breast: Secondary | ICD-10-CM | POA: Diagnosis not present

## 2017-04-30 DIAGNOSIS — Z13820 Encounter for screening for osteoporosis: Secondary | ICD-10-CM | POA: Diagnosis not present

## 2017-05-12 ENCOUNTER — Other Ambulatory Visit (HOSPITAL_BASED_OUTPATIENT_CLINIC_OR_DEPARTMENT_OTHER): Payer: Self-pay | Admitting: Family Medicine

## 2017-05-12 ENCOUNTER — Ambulatory Visit (HOSPITAL_BASED_OUTPATIENT_CLINIC_OR_DEPARTMENT_OTHER)
Admission: RE | Admit: 2017-05-12 | Discharge: 2017-05-12 | Disposition: A | Discharge status: Home / Self Care | Payer: BLUE CROSS/BLUE SHIELD | Source: Ambulatory Visit | Attending: Family Medicine | Admitting: Family Medicine

## 2017-05-12 DIAGNOSIS — M79604 Pain in right leg: Secondary | ICD-10-CM | POA: Diagnosis not present

## 2017-05-12 DIAGNOSIS — Z79890 Hormone replacement therapy (postmenopausal): Secondary | ICD-10-CM

## 2017-05-12 DIAGNOSIS — Z20828 Contact with and (suspected) exposure to other viral communicable diseases: Secondary | ICD-10-CM | POA: Diagnosis not present

## 2017-05-12 DIAGNOSIS — M79661 Pain in right lower leg: Secondary | ICD-10-CM | POA: Diagnosis not present

## 2017-05-20 ENCOUNTER — Encounter: Payer: Self-pay | Admitting: Sports Medicine

## 2017-05-20 ENCOUNTER — Ambulatory Visit (INDEPENDENT_AMBULATORY_CARE_PROVIDER_SITE_OTHER): Payer: BLUE CROSS/BLUE SHIELD | Admitting: Sports Medicine

## 2017-05-20 DIAGNOSIS — M249 Joint derangement, unspecified: Secondary | ICD-10-CM

## 2017-05-20 DIAGNOSIS — M7072 Other bursitis of hip, left hip: Secondary | ICD-10-CM | POA: Diagnosis not present

## 2017-05-20 MED ORDER — METHYLPREDNISOLONE ACETATE 40 MG/ML IJ SUSP
40.0000 mg | Freq: Once | INTRAMUSCULAR | Status: AC
  Administered 2017-05-20: 40 mg via INTRA_ARTICULAR

## 2017-05-20 NOTE — Assessment & Plan Note (Addendum)
Improvement but slow  We will keep up HEP  Corticosteroid injection today  gradually increase activity and sport  Procedure:  Injection of Left posterior facet greater trochanteric bursa Consent obtained and verified. Time-out conducted. Noted no overlying erythema, induration, or other signs of local infection. Skin prepped in a sterile fashion. Topical analgesic spray: Ethyl chloride. Completed without difficulty.  Location identified on [place].  After this the needle was directed into the bursal area without difficulty along posterior facet of greater trochanter. Meds: Solumedrol 40 MG and 4 cc lidocaine 1% Pain immediately improved suggesting accurate placement of the medication. Advised to call if fevers/chills, erythema, induration, drainage, or persistent bleeding.  C/B Dr. Raynelle Bring under my supervision

## 2017-05-20 NOTE — Progress Notes (Signed)
Chief complaint: Follow-up of chronic left hip pain  History of present illness: This is a 57 year old female presents to the sports medicine office today for follow-up of chronic left hip pain.  She was last seen here about 3 months ago for initial presentation of acute on chronic hip pain.  In brief review, she reports that initially noticed symptoms on the outside of her left hip, had difficulty and pain with going up and down stairs, as well as pivoting while playing tennis.  Ultrasound was done at last appointment, which showed hypoechoic changes noted just superior to the piriformis muscle, showing bursal inflammation just superior to the piriformis.  She was started on nitroglycerin was given home exercise program focusing on strengthening the gluteus medius, glutes minimus, as well as the piriformis muscles.  Unfortunately, she started to get some headaches with the nitroglycerin and stopped that.  She has been using intermittent Advil in the interim.  She does not report of any interval injury or trauma.  She is not report of any low back pain.  She has no report of any numbness, tingling, burning paresthesias radiating down her left leg.  She is not report of any left anterior hip pain.  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  She does report having history of left hip dislocation, but reports that it has been several years since that last happened.  He is very eager to get back into tennis.  Review of systems:  No sciatica No weakness And as above  Interval past medical history, surgical history, family history, and social history obtained and unchanged.  History is notable for asthma, GERD, and generalized hypermobility, she was just recently diagnosed with severe osteopenia; history notable for ablation, she does not report of any current tobacco use, family history notable for breast cancer, stroke, CAD, G's and medications are reviewed and are reflected in the  chart  Past Hx:  Injection of left hip for possible labral injury?  Physical exam: Vital signs are reviewed and are documented in the chart Gen.: Alert, oriented, appears stated age, in no apparent distress HEENT: Moist oral mucosa Respiratory: Normal respirations, able to speak in full sentences Cardiac: Regular rate, distal pulses 2+ Integumentary: No rashes on visible skin:  Neurologic: Does have weakness with hip flexion, hip abduction and hamstring strength on sides, would say that the side is slightly weaker than the right side, would categorize left-sided strength is 4/5, right side strength 4+/5, sensation 2+ bilateral lower extremities Psych: Normal affect, mood is described as good Musculoskeletal: Flexion of left hip reveals no obvious deformity or muscle atrophy, no warmth, erythema, ecchymosis, or effusion, she is tender to palpation over the distal glue medius, glutes minimus, piriformis as it does attachment to the greater trochanter, she is noted to be more hypomobile with hip internal and external range of motion, as well as hip flexion, right leg, FABER, FADIR, and logroll negative bilaterally  Musculoskeletal ultrasound was performed in the office today of her left hip: -Still continues to have hypoechoic changes just superior to the piriformis muscle indicative of continued bursal irritation and inflammation, but the size is less than what it was back in December -She still continues to have hypoechoic changes seen within the striae of the piriformis muscle, but no evidence of any tearing -No evidence of neovascularization when color flow Doppler was applied Minimal hypoechoic change around gluteus medius/ minimus tendons  Impression:  piriformis bursal irritation and inflammation, with subsequent strain on the  gluteus medius, gluteus minimus, piriformis, with interval healing since last ultrasound  Percent performed and interpreted by Cordelia Poche, MD, Wolfgang Phoenix Diallo Ponder,  MD  Assessment and plan: 1.  Gluteus medius syndrome, piriformis bursal irritation 2.  Diagnosis of severe osteopenia, just started calcium and vitamin D, will be starting HRT next week  Plan: Ultrasound with Radley in the office today.  It does appear that she does have slight interval improvement in the piriformis bursal size, but this is still present.  Discussed options today continue with the home exercises focusing on hip abduction, hip flexion, hamstring strength and using Advil or doing cortisone injection into the left greater trochanteric bursa.  She would like to proceed with the latter.  Procedure done as noted above without any complications noted.  We will plan to see her back in 4 weeks for follow-up or sooner as needed.  With her headache side-effects she may with to drop the NTG patches   Mort Sawyers, M.D. Primary Care Sports Medicine Fellow [place]  I observed and examined the patient with the [facility] fellow and agree with assessment and plan.  Note reviewed and modified by me.  I assisted in the ultrasound and the injection.  Ila Mcgill, MD

## 2017-05-20 NOTE — Assessment & Plan Note (Signed)
Likely gives her more hip stress  Can still voluntarily sublux hips

## 2017-07-14 DIAGNOSIS — S30866A Insect bite (nonvenomous) of unspecified external genital organs, female, initial encounter: Secondary | ICD-10-CM | POA: Diagnosis not present

## 2017-07-14 DIAGNOSIS — W57XXXA Bitten or stung by nonvenomous insect and other nonvenomous arthropods, initial encounter: Secondary | ICD-10-CM | POA: Diagnosis not present

## 2017-09-13 DIAGNOSIS — J209 Acute bronchitis, unspecified: Secondary | ICD-10-CM | POA: Diagnosis not present

## 2017-10-03 DIAGNOSIS — R05 Cough: Secondary | ICD-10-CM | POA: Diagnosis not present

## 2017-11-19 DIAGNOSIS — M9903 Segmental and somatic dysfunction of lumbar region: Secondary | ICD-10-CM | POA: Diagnosis not present

## 2017-11-19 DIAGNOSIS — M9905 Segmental and somatic dysfunction of pelvic region: Secondary | ICD-10-CM | POA: Diagnosis not present

## 2017-11-19 DIAGNOSIS — M9904 Segmental and somatic dysfunction of sacral region: Secondary | ICD-10-CM | POA: Diagnosis not present

## 2017-11-19 DIAGNOSIS — M7918 Myalgia, other site: Secondary | ICD-10-CM | POA: Diagnosis not present

## 2017-11-26 DIAGNOSIS — M9903 Segmental and somatic dysfunction of lumbar region: Secondary | ICD-10-CM | POA: Diagnosis not present

## 2017-11-26 DIAGNOSIS — M9904 Segmental and somatic dysfunction of sacral region: Secondary | ICD-10-CM | POA: Diagnosis not present

## 2017-11-26 DIAGNOSIS — M7918 Myalgia, other site: Secondary | ICD-10-CM | POA: Diagnosis not present

## 2017-11-26 DIAGNOSIS — M9905 Segmental and somatic dysfunction of pelvic region: Secondary | ICD-10-CM | POA: Diagnosis not present

## 2017-12-03 DIAGNOSIS — M9905 Segmental and somatic dysfunction of pelvic region: Secondary | ICD-10-CM | POA: Diagnosis not present

## 2017-12-03 DIAGNOSIS — M9903 Segmental and somatic dysfunction of lumbar region: Secondary | ICD-10-CM | POA: Diagnosis not present

## 2017-12-03 DIAGNOSIS — M9904 Segmental and somatic dysfunction of sacral region: Secondary | ICD-10-CM | POA: Diagnosis not present

## 2017-12-03 DIAGNOSIS — M7918 Myalgia, other site: Secondary | ICD-10-CM | POA: Diagnosis not present

## 2018-02-16 DIAGNOSIS — M9905 Segmental and somatic dysfunction of pelvic region: Secondary | ICD-10-CM | POA: Diagnosis not present

## 2018-02-16 DIAGNOSIS — M7918 Myalgia, other site: Secondary | ICD-10-CM | POA: Diagnosis not present

## 2018-02-16 DIAGNOSIS — M9903 Segmental and somatic dysfunction of lumbar region: Secondary | ICD-10-CM | POA: Diagnosis not present

## 2018-02-16 DIAGNOSIS — M9904 Segmental and somatic dysfunction of sacral region: Secondary | ICD-10-CM | POA: Diagnosis not present

## 2018-02-19 DIAGNOSIS — M9905 Segmental and somatic dysfunction of pelvic region: Secondary | ICD-10-CM | POA: Diagnosis not present

## 2018-02-19 DIAGNOSIS — M9904 Segmental and somatic dysfunction of sacral region: Secondary | ICD-10-CM | POA: Diagnosis not present

## 2018-02-19 DIAGNOSIS — M7918 Myalgia, other site: Secondary | ICD-10-CM | POA: Diagnosis not present

## 2018-02-19 DIAGNOSIS — M9903 Segmental and somatic dysfunction of lumbar region: Secondary | ICD-10-CM | POA: Diagnosis not present

## 2018-02-25 DIAGNOSIS — M7918 Myalgia, other site: Secondary | ICD-10-CM | POA: Diagnosis not present

## 2018-02-25 DIAGNOSIS — M9904 Segmental and somatic dysfunction of sacral region: Secondary | ICD-10-CM | POA: Diagnosis not present

## 2018-02-25 DIAGNOSIS — M9905 Segmental and somatic dysfunction of pelvic region: Secondary | ICD-10-CM | POA: Diagnosis not present

## 2018-02-25 DIAGNOSIS — M9903 Segmental and somatic dysfunction of lumbar region: Secondary | ICD-10-CM | POA: Diagnosis not present

## 2018-03-06 DIAGNOSIS — M9905 Segmental and somatic dysfunction of pelvic region: Secondary | ICD-10-CM | POA: Diagnosis not present

## 2018-03-06 DIAGNOSIS — M9904 Segmental and somatic dysfunction of sacral region: Secondary | ICD-10-CM | POA: Diagnosis not present

## 2018-03-06 DIAGNOSIS — M7918 Myalgia, other site: Secondary | ICD-10-CM | POA: Diagnosis not present

## 2018-03-06 DIAGNOSIS — M9903 Segmental and somatic dysfunction of lumbar region: Secondary | ICD-10-CM | POA: Diagnosis not present

## 2018-04-28 DIAGNOSIS — M545 Low back pain: Secondary | ICD-10-CM | POA: Diagnosis not present

## 2018-04-29 DIAGNOSIS — M9904 Segmental and somatic dysfunction of sacral region: Secondary | ICD-10-CM | POA: Diagnosis not present

## 2018-04-29 DIAGNOSIS — M25552 Pain in left hip: Secondary | ICD-10-CM | POA: Diagnosis not present

## 2018-04-29 DIAGNOSIS — M9903 Segmental and somatic dysfunction of lumbar region: Secondary | ICD-10-CM | POA: Diagnosis not present

## 2018-04-29 DIAGNOSIS — M9905 Segmental and somatic dysfunction of pelvic region: Secondary | ICD-10-CM | POA: Diagnosis not present

## 2018-05-04 DIAGNOSIS — M9905 Segmental and somatic dysfunction of pelvic region: Secondary | ICD-10-CM | POA: Diagnosis not present

## 2018-05-04 DIAGNOSIS — M9904 Segmental and somatic dysfunction of sacral region: Secondary | ICD-10-CM | POA: Diagnosis not present

## 2018-05-04 DIAGNOSIS — M9903 Segmental and somatic dysfunction of lumbar region: Secondary | ICD-10-CM | POA: Diagnosis not present

## 2018-05-04 DIAGNOSIS — M25552 Pain in left hip: Secondary | ICD-10-CM | POA: Diagnosis not present

## 2018-05-11 DIAGNOSIS — M9903 Segmental and somatic dysfunction of lumbar region: Secondary | ICD-10-CM | POA: Diagnosis not present

## 2018-05-11 DIAGNOSIS — M9904 Segmental and somatic dysfunction of sacral region: Secondary | ICD-10-CM | POA: Diagnosis not present

## 2018-05-11 DIAGNOSIS — M8580 Other specified disorders of bone density and structure, unspecified site: Secondary | ICD-10-CM | POA: Insufficient documentation

## 2018-05-11 DIAGNOSIS — M9905 Segmental and somatic dysfunction of pelvic region: Secondary | ICD-10-CM | POA: Diagnosis not present

## 2018-05-11 DIAGNOSIS — Z6820 Body mass index (BMI) 20.0-20.9, adult: Secondary | ICD-10-CM | POA: Diagnosis not present

## 2018-05-11 DIAGNOSIS — Z1231 Encounter for screening mammogram for malignant neoplasm of breast: Secondary | ICD-10-CM | POA: Diagnosis not present

## 2018-05-11 DIAGNOSIS — Z01419 Encounter for gynecological examination (general) (routine) without abnormal findings: Secondary | ICD-10-CM | POA: Diagnosis not present

## 2018-05-11 DIAGNOSIS — M25552 Pain in left hip: Secondary | ICD-10-CM | POA: Diagnosis not present

## 2018-07-09 DIAGNOSIS — Z91038 Other insect allergy status: Secondary | ICD-10-CM | POA: Diagnosis not present

## 2018-07-09 DIAGNOSIS — R05 Cough: Secondary | ICD-10-CM | POA: Diagnosis not present

## 2018-12-10 ENCOUNTER — Other Ambulatory Visit: Payer: Self-pay

## 2018-12-10 ENCOUNTER — Ambulatory Visit (INDEPENDENT_AMBULATORY_CARE_PROVIDER_SITE_OTHER): Payer: BC Managed Care – PPO | Admitting: Sports Medicine

## 2018-12-10 VITALS — BP 110/70 | Ht 69.0 in | Wt 130.0 lb

## 2018-12-10 DIAGNOSIS — M25552 Pain in left hip: Secondary | ICD-10-CM

## 2018-12-10 DIAGNOSIS — M357 Hypermobility syndrome: Secondary | ICD-10-CM | POA: Diagnosis not present

## 2018-12-10 MED ORDER — MELOXICAM 15 MG PO TABS: 15.0000 mg | ORAL_TABLET | Freq: Every day | ORAL | 2 refills | Status: DC

## 2018-12-10 NOTE — Assessment & Plan Note (Signed)
Likely 2/2 repeated subluxation of hip given hypermobility.  Likely with bursitis 2/2 repeated subluxation and inflammation.  Has a hypermobility score of 6.  No weakness, and negative special tests, therefore no indication for surgical intervention.  Discussed that injections would provide relief in short term, but are not long-term treatment.  Advised obtaining supportive sleeve to wear over hip to maintain proper hip alignment and exercise to maintain hip alignment, including bringing knee to chest and resetting pelvis.  Patient needs to leave and is unable to stay for [place] or injection today.  Will trial Mobic 15mg  QD x 2 weeks then prn and stability sleeve.  F/U if no improvement.

## 2018-12-10 NOTE — Progress Notes (Signed)
Taylor Sharp is a 58 y.o. female who presents to [facility] today for the following:  Left hip pain  Has been having this for years Knows she has a torn labrum and bursitis in left hip Pain in buttocks and occasionally radiates to the front and in the groin Worsens with walking "feels like it isn't streamline" Describes pain as aching, sometimes stabbing Difficult to sleep, keeps pillow between her legs Has had exercises in the past, but hasn't been doing them in a long time Feels like her hip "splays out" and she has to pull it back in Also feels it pop usually when she is stretching and tightening muscles in her leg while extending knee Pain is more constant Rates pain 8/10-10/10, at best about 6/10 Ibuprofen 400mg  prn for pain, does not require daily, usually at night Walks a lot and used to play tennis Last injection in greater trochanteric bursa was 05/2017 and saw improvement with this, would like another one No sciatica, no weakness, no low back pain Patient is a former ballerina and notes that she is very flexible  Left knee pain Very old injury to left knee in college, can't remember specifics Thinks last cortisone shot was 20-25 yrs ago Pain along medial joint line "not stable" Worsens with walking and sleeping, same as hip Usually hurts the same time that hip is hurting, but is more concerned about her hip Would like to consider a steroid injection in knee as well if needed  PMH reviewed. Asthma ROS as above. Medications reviewed.  Exam:  BP 110/70   Ht 5\' 9"  (1.753 m)   Wt 130 lb (59 kg)   BMI 19.20 kg/m  Gen: Well NAD MSK:  Hip:  - Inspection: No gross deformity, no swelling, erythema, or ecchymosis - Palpation: No TTP, specifically none over greater trochanter - ROM: Normal range of motion on Flexion, extension, abduction.  Increased ROM internal rotation to 45 degrees and external rotation to 70 degrees - Strength: Normal strength. - Neuro/vasc: NV intact  distally - Special Tests: Negative FABER and FADIR.  Negative Trendelenberg.  Negative Ober's.   Left knee exam - no swelling and norm. ROM  Beighton score of 5 with HM hips as well   Assessment and Plan: 1) Left hip pain Likely 2/2 repeated subluxation of hip given hypermobility.  Likely with bursitis 2/2 repeated subluxation and inflammation.  Has a hypermobility score of 6.  No weakness, and negative special tests, therefore no indication for surgical intervention.  Discussed that injections would provide relief in short term, but are not long-term treatment.  Advised obtaining supportive sleeve to wear over hip to maintain proper hip alignment and exercise to maintain hip alignment, including bringing knee to chest and resetting pelvis.  Patient needs to leave and is unable to stay for [place] or injection today.  Will trial Mobic 15mg  QD x 2 weeks then prn and stability sleeve.  F/U if no improvement.   [place] Constable, D.O.   I observed and examined the patient with the resident and agree with assessment and plan.  Note reviewed and modified by me. Ila Mcgill, MD   12/10/2018 2:51 PM

## 2018-12-22 ENCOUNTER — Ambulatory Visit: Payer: BC Managed Care – PPO | Admitting: Sports Medicine

## 2018-12-22 DIAGNOSIS — Z20828 Contact with and (suspected) exposure to other viral communicable diseases: Secondary | ICD-10-CM | POA: Diagnosis not present

## 2019-01-21 ENCOUNTER — Other Ambulatory Visit: Payer: Self-pay

## 2019-01-21 ENCOUNTER — Encounter: Payer: Self-pay | Admitting: Sports Medicine

## 2019-01-21 ENCOUNTER — Ambulatory Visit (INDEPENDENT_AMBULATORY_CARE_PROVIDER_SITE_OTHER): Payer: BC Managed Care – PPO | Admitting: Sports Medicine

## 2019-01-21 DIAGNOSIS — M25552 Pain in left hip: Secondary | ICD-10-CM | POA: Diagnosis not present

## 2019-01-21 MED ORDER — METHYLPREDNISOLONE ACETATE 40 MG/ML IJ SUSP
40.0000 mg | Freq: Once | INTRAMUSCULAR | Status: AC
  Administered 2019-01-21: 40 mg via INTRA_ARTICULAR

## 2019-01-21 NOTE — Assessment & Plan Note (Signed)
For CSI Limit extremes of motion Strength is good and keep up standard exercises  Reck if not responding

## 2019-01-21 NOTE — Progress Notes (Signed)
  Taylor Sharp - 58 y.o. female MRN [id number]  Date of birth: 16-Jan-1961  SUBJECTIVE:   CC: 6-week follow-up for left hip pain  Patient is a 58 year old female is presenting for 6-week follow-up for left hip pain.  Since last visit, pain is not improved.  Located primarily in posterior lateral hip and aggravated by random movements.  Pain is described as an aching and occasionally stabbing pain.  Denies numbness/weakness/tingling in lower extremities, saddle paresthesia, urinary incontinence, trauma.  Plan at previous visit was to use NSAIDs daily, hip exercises, and hip stabilizing brace.  Patient says that she has been inconsistent with NSAID and hip stabilization brace and has not been doing exercises.  History of torn labrum and left hip bursitis that were both improved with corticosteroid injections.  Last bursitis injection in 05/2017 with significant improvement in pain.  Patient is requesting additional injection at today's visit.   Past HX: hypermobility Very remote pelvic fracture  ROS: No unexpected weight loss, fever, chills, swelling, instability, muscle pain, numbness/tingling, redness, otherwise see HPI   PMHx - Updated and reviewed.  Contributory factors include: Negative PSHx - Updated and reviewed.  Contributory factors include:  Negative FHx - Updated and reviewed.  Contributory factors include:  Negative Social Hx - Updated and reviewed. Contributory factors include: Negative Medications - reviewed   DATA REVIEWED: Previous notes  PHYSICAL EXAM:  VS: BP:108/70  HR: bpm  TEMP: ( )  RESP:   HT:5\' 8"  (172.7 cm)   WT:135 lb (61.2 kg)  BMI:20.53 PHYSICAL EXAM: Gen: NAD, alert, cooperative with exam, well-appearing  Musculoskeletal Lower extremities:  - Inspection: No gross deformity, no swelling, erythema, or ecchymosis - Palpation: TTP to left piriformis/gemellus tendons attaching to greater trochanter - ROM: Normal range of motion on Flexion, extension,  abduction.  Increased ROM internal rotation to 45 degrees and external rotation to 70 degrees - Strength: Normal strength. - Neuro/vasc: NV intact distally - Special Tests: Negative FABER and FADIR.  Negative Trendelenberg.  Negative straight leg raise.  Limited left hip ultrasound -No edema over greater trochanter.   Mild edema along piriformis and gemellus tendons.  No intra-articular deformity appreciated. Very small spur with hypoechoic change at anterior labrum No effusion  Impression: consistent with hip rotator tendinopathy  Procedure:  Injection of left hip rotator tendon sheath Consent obtained and verified. Time-out conducted. Noted no overlying erythema, induration, or other signs of local infection. Skin prepped in a sterile fashion. Topical analgesic spray: Ethyl chloride. Completed without difficulty using US guidance Meds: 1 cc solumedrol and 4 cc lidocaine 1 % Pain immediately improved suggesting accurate placement of the medication. Advised to call if fevers/chills, erythema, induration, drainage, or persistent bleeding.  [place] and Injection by Peterson Ao B. Durrell Barajas, MD       ASSESSMENT & PLAN:   #Left hip pain -Chronic and not improving -Patient received ultrasound-guided corticosteroid injection around tendons attaching to greater trochanter.  Patient was cleaned and draped.  Injection performed with 25-gauge needle.  Needle was in place on ultrasound.  Patient tolerated injection well. -Continue oral NSAIDs as needed for pain control -Continue daily hip strengthening exercises -Continue hip stabilization brace when active -Follow-up if no improving or worsening of condition   Portions of this note were written by medical resident, Glennon Mac, PGY 3.  I observed and examined the patient with the resident and agree with assessment and plan.  Note reviewed and modified by me. Ila Mcgill, MD

## 2019-02-20 DIAGNOSIS — Z20828 Contact with and (suspected) exposure to other viral communicable diseases: Secondary | ICD-10-CM | POA: Diagnosis not present

## 2019-08-03 ENCOUNTER — Ambulatory Visit: Payer: BC Managed Care – PPO | Admitting: Sports Medicine

## 2019-11-03 ENCOUNTER — Other Ambulatory Visit: Payer: Self-pay

## 2019-11-03 ENCOUNTER — Encounter: Payer: Self-pay | Admitting: Podiatry

## 2019-11-03 ENCOUNTER — Ambulatory Visit (INDEPENDENT_AMBULATORY_CARE_PROVIDER_SITE_OTHER): Payer: 59 | Admitting: Podiatry

## 2019-11-03 ENCOUNTER — Ambulatory Visit (INDEPENDENT_AMBULATORY_CARE_PROVIDER_SITE_OTHER): Payer: 59

## 2019-11-03 VITALS — BP 97/59 | HR 67 | Temp 97.2°F | Resp 14

## 2019-11-03 DIAGNOSIS — M779 Enthesopathy, unspecified: Secondary | ICD-10-CM

## 2019-11-03 DIAGNOSIS — M7750 Other enthesopathy of unspecified foot: Secondary | ICD-10-CM

## 2019-11-03 DIAGNOSIS — D3610 Benign neoplasm of peripheral nerves and autonomic nervous system, unspecified: Secondary | ICD-10-CM | POA: Diagnosis not present

## 2019-11-03 DIAGNOSIS — M778 Other enthesopathies, not elsewhere classified: Secondary | ICD-10-CM

## 2019-11-03 NOTE — Progress Notes (Signed)
Subjective:   Patient ID: Taylor Sharp, female   DOB: 59 y.o.   MRN: [id number]   HPI Patient presents stating I am getting bone spurs on top of both my feet that are getting painful with fluid buildup and I have a shooting pain between third and fourth toe of my left foot.  States it is been going on and worsened over the last 6 months patient has trouble wearing shoes and tries to modify and does not smoke likes to be active   Review of Systems  All other systems reviewed and are negative.       Objective:  Physical Exam Vitals and nursing note reviewed.  Constitutional:      Appearance: She is well-developed.  Pulmonary:     Effort: Pulmonary effort is normal.  Musculoskeletal:        General: Normal range of motion.  Skin:    General: Skin is warm.  Neurological:     Mental Status: She is alert.     Neurovascular status found to be intact muscle strength adequate range of motion within normal limits.  Patient is noted to have spurring of the metatarsocuneiform joint bilateral with fluid buildup around it and is noted to have a positive Mulder sign left with clear sensation.  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  I do think ultimately these bone spurs will need to be removed and I educated her on this and at this point working to try conservative and I did inject the tendon complex extensor 3 mg Dexasone Kenalog 5 mg Xylocaine.  I also discussed neuroma resection at that time and depending on his response it may be something we do soon or work in a way and I gave her all education information concerning surgery  X-rays indicate that there is some spurring of the metatarsocuneiform joint bilateral

## 2020-03-05 ENCOUNTER — Emergency Department (HOSPITAL_BASED_OUTPATIENT_CLINIC_OR_DEPARTMENT_OTHER)
Admission: EM | Admit: 2020-03-05 | Discharge: 2020-03-05 | Disposition: A | Discharge status: Home / Self Care | Payer: 59 | Attending: Emergency Medicine | Admitting: Emergency Medicine

## 2020-03-05 ENCOUNTER — Encounter (HOSPITAL_BASED_OUTPATIENT_CLINIC_OR_DEPARTMENT_OTHER): Payer: Self-pay

## 2020-03-05 ENCOUNTER — Other Ambulatory Visit: Payer: Self-pay

## 2020-03-05 DIAGNOSIS — J45909 Unspecified asthma, uncomplicated: Secondary | ICD-10-CM | POA: Insufficient documentation

## 2020-03-05 DIAGNOSIS — R112 Nausea with vomiting, unspecified: Secondary | ICD-10-CM | POA: Diagnosis not present

## 2020-03-05 DIAGNOSIS — Z20822 Contact with and (suspected) exposure to covid-19: Secondary | ICD-10-CM | POA: Diagnosis not present

## 2020-03-05 DIAGNOSIS — R290 Tetany: Secondary | ICD-10-CM | POA: Diagnosis not present

## 2020-03-05 DIAGNOSIS — M791 Myalgia, unspecified site: Secondary | ICD-10-CM | POA: Insufficient documentation

## 2020-03-05 DIAGNOSIS — M546 Pain in thoracic spine: Secondary | ICD-10-CM | POA: Diagnosis not present

## 2020-03-05 DIAGNOSIS — R21 Rash and other nonspecific skin eruption: Secondary | ICD-10-CM | POA: Diagnosis not present

## 2020-03-05 DIAGNOSIS — R197 Diarrhea, unspecified: Secondary | ICD-10-CM | POA: Diagnosis not present

## 2020-03-05 LAB — URINALYSIS, ROUTINE W REFLEX MICROSCOPIC
Bilirubin Urine: NEGATIVE
Glucose, UA: NEGATIVE mg/dL
Ketones, ur: NEGATIVE mg/dL
Leukocytes,Ua: NEGATIVE
Nitrite: NEGATIVE
Protein, ur: NEGATIVE mg/dL
Specific Gravity, Urine: 1.015 (ref 1.005–1.030)
pH: 5 (ref 5.0–8.0)

## 2020-03-05 LAB — CBC
HCT: 42.4 % (ref 36.0–46.0)
Hemoglobin: 14.3 g/dL (ref 12.0–15.0)
MCH: 30.2 pg (ref 26.0–34.0)
MCHC: 33.7 g/dL (ref 30.0–36.0)
MCV: 89.6 fL (ref 80.0–100.0)
Platelets: 276 10*3/uL (ref 150–400)
RBC: 4.73 MIL/uL (ref 3.87–5.11)
RDW: 11.9 % (ref 11.5–15.5)
WBC: 6.6 10*3/uL (ref 4.0–10.5)
nRBC: 0 % (ref 0.0–0.2)

## 2020-03-05 LAB — COMPREHENSIVE METABOLIC PANEL
ALT: 15 U/L (ref 0–44)
AST: 24 U/L (ref 15–41)
Albumin: 4.1 g/dL (ref 3.5–5.0)
Alkaline Phosphatase: 71 U/L (ref 38–126)
Anion gap: 12 (ref 5–15)
BUN: 10 mg/dL (ref 6–20)
CO2: 22 mmol/L (ref 22–32)
Calcium: 9 mg/dL (ref 8.9–10.3)
Chloride: 101 mmol/L (ref 98–111)
Creatinine, Ser: 0.77 mg/dL (ref 0.44–1.00)
GFR, Estimated: >60 mL/min (ref >60)
Glucose, Bld: 170 mg/dL — ABNORMAL HIGH (ref 70–99)
Potassium: 3 mmol/L — ABNORMAL LOW (ref 3.5–5.1)
Sodium: 135 mmol/L (ref 135–145)
Total Bilirubin: 1 mg/dL (ref 0.3–1.2)
Total Protein: 6.9 g/dL (ref 6.5–8.1)

## 2020-03-05 LAB — URINALYSIS, MICROSCOPIC (REFLEX)

## 2020-03-05 LAB — PREGNANCY, URINE: Preg Test, Ur: NEGATIVE

## 2020-03-05 LAB — RESP PANEL BY RT-PCR (FLU A&B, COVID) ARPGX2
Influenza A by PCR: NEGATIVE
Influenza B by PCR: NEGATIVE
SARS Coronavirus 2 by RT PCR: NEGATIVE

## 2020-03-05 LAB — LIPASE, BLOOD: Lipase: 34 U/L (ref 11–51)

## 2020-03-05 MED ORDER — POTASSIUM CHLORIDE CRYS ER 20 MEQ PO TBCR
40.0000 meq | EXTENDED_RELEASE_TABLET | Freq: Once | ORAL | Status: AC
  Administered 2020-03-05: 40 meq via ORAL
  Filled 2020-03-05: qty 2

## 2020-03-05 MED ORDER — OXYCODONE HCL 5 MG PO TABS
5.0000 mg | ORAL_TABLET | Freq: Once | ORAL | Status: AC
  Administered 2020-03-05: 5 mg via ORAL
  Filled 2020-03-05: qty 1

## 2020-03-05 MED ORDER — ACETAMINOPHEN 500 MG PO TABS
1000.0000 mg | ORAL_TABLET | Freq: Once | ORAL | Status: AC
  Administered 2020-03-05: 1000 mg via ORAL
  Filled 2020-03-05: qty 2

## 2020-03-05 MED ORDER — KETOROLAC TROMETHAMINE 60 MG/2ML IM SOLN
15.0000 mg | Freq: Once | INTRAMUSCULAR | Status: AC
  Administered 2020-03-05: 15 mg via INTRAMUSCULAR
  Filled 2020-03-05: qty 2

## 2020-03-05 MED ORDER — DIAZEPAM 5 MG PO TABS
5.0000 mg | ORAL_TABLET | Freq: Once | ORAL | Status: AC
  Administered 2020-03-05: 5 mg via ORAL
  Filled 2020-03-05: qty 1

## 2020-03-05 NOTE — ED Triage Notes (Signed)
Complains of vomiting/diarrhea, headache. Able to eat and drink. Now complaining of neck pain down to back and hips.

## 2020-03-05 NOTE — ED Notes (Signed)
States pain is not as bad, not as sharp, more of a dull ache now, but feels much better. Husband here to drive pt home

## 2020-03-05 NOTE — ED Notes (Signed)
Medicated per EDP orders, will monitor client for 15-20 min to assess medication admin outcome and reassess vs.

## 2020-03-05 NOTE — ED Provider Notes (Signed)
[facility] EMERGENCY DEPARTMENT Provider Note   CSN: [id number] Arrival date & time: 03/05/20  7829     History Chief Complaint  Patient presents with  . Back Pain    Taylor Sharp is a 59 y.o. female.  59 yo F with a chief complaint of thoracic back pain.  Patient symptoms initially started with nausea vomiting and diarrhea.  Going on for about 3 to 4 days after she ate at [employer].  She thinks maybe she ate some bad chicken which caused her to have Salmonella.  She had some foul-smelling stools but denied any blood or fever.  She has had a rash off and on to her neck, after getting her Covid booster in November.  Denies any significant rash currently.  She denies any abdominal pain.  Her nausea vomiting and diarrhea have resolved but now she is complaining of thoracic back pain that she feels like radiates to both of her hips.  She feels like she has a burning pain to both of her hips and they feel somewhat numb.  She has had chronic problems with her hips but feels like this is different.  She denies numbness or weakness to her legs denies loss of bowel or bladder denies loss of peritoneal sensation.  She has been able to ambulate without issue.  She denies recent surgery or injection into the spine.  She denies history of IV drug abuse. Denies hx of cancer.     The history is provided by the patient.  Back Pain Location:  Thoracic spine Quality:  Aching Radiates to: bilateral buttocks. Pain severity:  Severe Pain is:  Same all the time Onset quality:  Gradual Duration:  1 week Timing:  Constant Progression:  Worsening Chronicity:  New Relieved by:  Nothing Worsened by:  Nothing Ineffective treatments:  None tried Associated symptoms: no chest pain, no dysuria, no fever and no headaches   Risk factors: no hx of cancer, no hx of osteoporosis, no lack of exercise, not pregnant, no recent surgery and no steroid use        Past Medical History:  Diagnosis  Date  . Asthma     Patient Active Problem List   Diagnosis Date Noted  . Left hip pain 12/10/2018  . Osteopenia 05/11/2018  . Bursitis of hip 02/18/2017  . 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 GERD (gastroesophageal reflux disease) 08/04/2014  . Upper airway cough syndrome 08/04/2014  . Chest discomfort 05/25/2014    Past Surgical History:  Procedure Laterality Date  . ABLATION       OB History   No obstetric history on file.     Family History  Problem Relation Age of Onset  . Breast cancer Mother   . Hypertension Mother   . Heart attack Father   . Heart disease Father   . CVA Maternal Grandmother   . Heart attack Paternal Grandfather     Social History   Tobacco Use  . Smoking status: Never Smoker  . Smokeless tobacco: Never Used  Substance Use Topics  . Alcohol use: Yes    Alcohol/week: 0.0 standard drinks    Comment: OCCASIONALLY  . Drug use: No    Home Medications Prior to Admission medications   Medication Sig Start Date End Date Taking? Authorizing Provider  albuterol (VENTOLIN HFA) 108 (90 Base) MCG/ACT inhaler albuterol sulfate HFA 90 mcg/actuation aerosol inhaler  INHALE 2 PUFFS BY MOUTH EVERY 6 HOURS AS NEEDED  [provider]  ALPRAZolam (XANAX) 0.25 MG tablet alprazolam 0.25 mg tablet  TAKE 1 TABLET BY MOUTH EVERY 12 HOURS AS NEEDED    [provider]  cyclobenzaprine (FLEXERIL) 10 MG tablet cyclobenzaprine 10 mg tablet    [provider]  estradiol (ESTRACE) 0.5 MG tablet estradiol 0.5 mg tablet  Take 1 tablet every day by oral route.    [provider]  meloxicam (MOBIC) 15 MG tablet Take 1 tablet (15 mg total) by mouth daily. Take with food. 12/10/18   Meccariello, Solmon Ice, DO  montelukast (SINGULAIR) 10 MG tablet montelukast 10 mg tablet  TAKE 1 TABLET BY MOUTH EVERY DAY    [provider]  progesterone (PROMETRIUM) 100 MG capsule TAKE 1 CAPSULE BY MOUTH EVERY  DAY IN THE EVENING AT BEDTIME 04/16/17   [provider]  progesterone (PROMETRIUM) 100 MG capsule progesterone micronized 100 mg capsule  TAKE 1 CAPSULE EVERY EVENING    [provider]    Allergies    Codeine and Prednisone  Review of Systems   Review of Systems  Constitutional: Negative for chills and fever.  HENT: Negative for congestion and rhinorrhea.   Eyes: Negative for redness and visual disturbance.  Respiratory: Negative for shortness of breath and wheezing.   Cardiovascular: Negative for chest pain and palpitations.  Gastrointestinal: Negative for nausea and vomiting.  Genitourinary: Negative for dysuria and urgency.  Musculoskeletal: Positive for back pain. Negative for arthralgias and myalgias.  Skin: Negative for pallor and wound.  Neurological: Negative for dizziness and headaches.    Physical Exam Updated Vital Signs BP 129/67   Pulse 73   Temp 97.9 F (36.6 C) (Oral)   Resp 17   Ht 5' 8.5" (1.74 m)   Wt 61.2 kg   SpO2 99%   BMI 20.23 kg/m   Physical Exam Vitals and nursing note reviewed.  Constitutional:      General: She is not in acute distress.    Appearance: She is well-developed and well-nourished. She is not diaphoretic.  HENT:     Head: Normocephalic and atraumatic.  Eyes:     Extraocular Movements: EOM normal.     Pupils: Pupils are equal, round, and reactive to light.  Cardiovascular:     Rate and Rhythm: Normal rate and regular rhythm.     Heart sounds: No murmur heard. No friction rub. No gallop.   Pulmonary:     Effort: Pulmonary effort is normal.     Breath sounds: No wheezing or rales.  Abdominal:     General: There is no distension.     Palpations: Abdomen is soft.     Tenderness: There is no abdominal tenderness.  Musculoskeletal:        General: Tenderness present. No edema.     Cervical back: Normal range of motion and neck supple.     Comments: Tenderness about the lower thoracic spine about T8-12.  Pulse  motor and sensation intact bilateral lower extremities.  Able to ambulate without difficulty.  Reflexes are 2+ and equal.  There is no clonus.  Full range of motion of bilateral hips without tenderness.  Points to the area of the SI joints bilaterally when she discusses hip pain.  Skin:    General: Skin is warm and dry.  Neurological:     Mental Status: She is alert and oriented to person, place, and time.  Psychiatric:        Mood and Affect: Mood and affect normal.  Behavior: Behavior normal.     ED Results / Procedures / Treatments   Labs (all labs ordered are listed, but only abnormal results are displayed) Labs Reviewed  COMPREHENSIVE METABOLIC PANEL - Abnormal; Notable for the following components:      Result Value   Potassium 3.0 (*)    Glucose, Bld 170 (*)    All other components within normal limits  URINALYSIS, ROUTINE W REFLEX MICROSCOPIC - Abnormal; Notable for the following components:   Hgb urine dipstick SMALL (*)    All other components within normal limits  URINALYSIS, MICROSCOPIC (REFLEX) - Abnormal; Notable for the following components:   Bacteria, UA MANY (*)    All other components within normal limits  RESP PANEL BY RT-PCR (FLU A&B, COVID) ARPGX2  LIPASE, BLOOD  CBC  PREGNANCY, URINE    EKG None  Radiology No results found.  Procedures Procedures (including critical care time)  Medications Ordered in ED Medications  acetaminophen (TYLENOL) tablet 1,000 mg (1,000 mg Oral Given 03/05/20 2154)  ketorolac (TORADOL) injection 15 mg (15 mg Intramuscular Given 03/05/20 2154)  oxyCODONE (Oxy IR/ROXICODONE) immediate release tablet 5 mg (5 mg Oral Given 03/05/20 2154)  diazepam (VALIUM) tablet 5 mg (5 mg Oral Given 03/05/20 2153)  potassium chloride SA (KLOR-CON) CR tablet 40 mEq (40 mEq Oral Given 03/05/20 2207)    ED Course  I have reviewed the triage vital signs and the nursing notes.  Pertinent labs & imaging results that were available  during my care of the patient were reviewed by me and considered in my medical decision making (see chart for details).    MDM Rules/Calculators/A&P                          59 yo F with a chief complaints of what sounds like a viral syndrome nausea vomiting diarrhea and diffuse myalgias.  Tells me she has back pain from the bottom of her neck all the way down to her tailbone.  She feels like it radiates into both of her hips.  Described as a dull and burning pain.  She has no cauda equina symptoms.  She does tell me that she has some feeling like both of her hips are numb she points to the posterior aspect of the leg bilaterally.  Her symptoms are a bit atypical and with radiation to bilateral legs I did offer to send her to [facility] for an MRI though I suspect more likely the patient has either myalgias from her recent viral syndrome or she has muscular strain after having nausea vomiting and diarrhea.  She elects for conservative treatment at this time.  Understands return for any cauda equina symptoms.  Will follow up with her family doctor in the office.  10:18 PM:  I have discussed the diagnosis/risks/treatment options with the patient and believe the pt to be eligible for discharge home to follow-up with PCP. We also discussed returning to the ED immediately if new or worsening sx occur. We discussed the sx which are most concerning (e.g., sudden worsening pain, fever, inability to tolerate by mouth, cauda equina s/sx) that necessitate immediate return. Medications administered to the patient during their visit and any new prescriptions provided to the patient are listed below.  Medications given during this visit Medications  acetaminophen (TYLENOL) tablet 1,000 mg (1,000 mg Oral Given 03/05/20 2154)  ketorolac (TORADOL) injection 15 mg (15 mg Intramuscular Given 03/05/20 2154)  oxyCODONE (Oxy IR/ROXICODONE) immediate release  tablet 5 mg (5 mg Oral Given 03/05/20 2154)  diazepam (VALIUM) tablet 5  mg (5 mg Oral Given 03/05/20 2153)  potassium chloride SA (KLOR-CON) CR tablet 40 mEq (40 mEq Oral Given 03/05/20 2207)     The patient appears reasonably screen and/or stabilized for discharge and I doubt any other medical condition or other [facility] requiring further screening, evaluation, or treatment in the ED at this time prior to discharge.   Final Clinical Impression(s) / ED Diagnoses Final diagnoses:  Acute midline thoracic back pain    Rx / DC Orders ED Discharge Orders    None       Deno Etienne, DO 03/05/20 2218

## 2020-03-05 NOTE — Discharge Instructions (Addendum)

## 2020-03-06 ENCOUNTER — Encounter (HOSPITAL_COMMUNITY): Payer: Self-pay

## 2020-03-06 ENCOUNTER — Emergency Department (EMERGENCY_DEPARTMENT_HOSPITAL)
Admission: EM | Admit: 2020-03-06 | Discharge: 2020-03-06 | Disposition: A | Discharge status: Home / Self Care | Payer: 59 | Source: Home / Self Care | Attending: Emergency Medicine | Admitting: Emergency Medicine

## 2020-03-06 ENCOUNTER — Other Ambulatory Visit: Payer: Self-pay

## 2020-03-06 DIAGNOSIS — I770 Arteriovenous fistula, acquired: Secondary | ICD-10-CM | POA: Diagnosis not present

## 2020-03-06 DIAGNOSIS — R290 Tetany: Secondary | ICD-10-CM

## 2020-03-06 DIAGNOSIS — M546 Pain in thoracic spine: Secondary | ICD-10-CM | POA: Diagnosis not present

## 2020-03-06 DIAGNOSIS — J45909 Unspecified asthma, uncomplicated: Secondary | ICD-10-CM | POA: Insufficient documentation

## 2020-03-06 DIAGNOSIS — I7774 Dissection of vertebral artery: Secondary | ICD-10-CM | POA: Diagnosis not present

## 2020-03-06 DIAGNOSIS — M7918 Myalgia, other site: Secondary | ICD-10-CM | POA: Insufficient documentation

## 2020-03-06 DIAGNOSIS — M791 Myalgia, unspecified site: Secondary | ICD-10-CM

## 2020-03-06 LAB — COMPREHENSIVE METABOLIC PANEL
ALT: 15 U/L (ref 0–44)
AST: 21 U/L (ref 15–41)
Albumin: 3.7 g/dL (ref 3.5–5.0)
Alkaline Phosphatase: 67 U/L (ref 38–126)
Anion gap: 12 (ref 5–15)
BUN: 7 mg/dL (ref 6–20)
CO2: 20 mmol/L — ABNORMAL LOW (ref 22–32)
Calcium: 8.9 mg/dL (ref 8.9–10.3)
Chloride: 106 mmol/L (ref 98–111)
Creatinine, Ser: 0.8 mg/dL (ref 0.44–1.00)
GFR, Estimated: >60 mL/min (ref >60)
Glucose, Bld: 133 mg/dL — ABNORMAL HIGH (ref 70–99)
Potassium: 3.5 mmol/L (ref 3.5–5.1)
Sodium: 138 mmol/L (ref 135–145)
Total Bilirubin: 1.1 mg/dL (ref 0.3–1.2)
Total Protein: 6.3 g/dL — ABNORMAL LOW (ref 6.5–8.1)

## 2020-03-06 LAB — CBC
HCT: 41.2 % (ref 36.0–46.0)
Hemoglobin: 14.3 g/dL (ref 12.0–15.0)
MCH: 30.6 pg (ref 26.0–34.0)
MCHC: 34.7 g/dL (ref 30.0–36.0)
MCV: 88.2 fL (ref 80.0–100.0)
Platelets: 295 10*3/uL (ref 150–400)
RBC: 4.67 MIL/uL (ref 3.87–5.11)
RDW: 12.2 % (ref 11.5–15.5)
WBC: 8.3 10*3/uL (ref 4.0–10.5)
nRBC: 0 % (ref 0.0–0.2)

## 2020-03-06 LAB — TSH: TSH: 4.676 u[IU]/mL — ABNORMAL HIGH (ref 0.350–4.500)

## 2020-03-06 LAB — MAGNESIUM: Magnesium: 2 mg/dL (ref 1.7–2.4)

## 2020-03-06 MED ORDER — METHOCARBAMOL 500 MG PO TABS: 1000.0000 mg | ORAL_TABLET | Freq: Three times a day (TID) | ORAL | 0 refills | Status: AC

## 2020-03-06 MED ORDER — METHOCARBAMOL 1000 MG/10ML IJ SOLN
1000.0000 mg | Freq: Once | INTRAVENOUS | Status: AC
  Administered 2020-03-06: 1000 mg via INTRAVENOUS
  Filled 2020-03-06: qty 10

## 2020-03-06 MED ORDER — METHOCARBAMOL 1000 MG/10ML IJ SOLN: 1000.0000 mg | Freq: Once | INTRAMUSCULAR | Status: DC

## 2020-03-06 MED ORDER — METHOCARBAMOL 1000 MG/10ML IJ SOLN
1000.0000 mg | Freq: Once | INTRAVENOUS | Status: AC
  Administered 2020-03-06: 1000 mg via INTRAVENOUS
  Filled 2020-03-06 (×2): qty 10

## 2020-03-06 MED ORDER — KETOROLAC TROMETHAMINE 30 MG/ML IJ SOLN
30.0000 mg | Freq: Once | INTRAMUSCULAR | Status: AC
  Administered 2020-03-06: 30 mg via INTRAVENOUS
  Filled 2020-03-06: qty 1

## 2020-03-06 MED ORDER — LORAZEPAM 2 MG/ML IJ SOLN
2.0000 mg | Freq: Once | INTRAMUSCULAR | Status: AC
  Administered 2020-03-06: 2 mg via INTRAVENOUS
  Filled 2020-03-06: qty 1

## 2020-03-06 MED ORDER — MORPHINE SULFATE (PF) 4 MG/ML IV SOLN
4.0000 mg | Freq: Once | INTRAVENOUS | Status: AC
Start: 2020-03-06 — End: 2020-03-06
  Administered 2020-03-06: 4 mg via INTRAVENOUS
  Filled 2020-03-06: qty 1

## 2020-03-06 NOTE — ED Provider Notes (Addendum)
[facility] EMERGENCY DEPARTMENT Provider Note   CSN: [id number] Arrival date & time: 03/06/20  [id number]     History No chief complaint on file.   Taylor Sharp is a 59 y.o. female presents to ER for evaluation of back and neck pain.  She brings a notepad where she as written down her symptoms over last few days. States when the pain comes on she can't even talk. Went to see PCP at [facility] who sent her to ER for possible MRI and lumbar puncture to rule out meningitis. Ate out 23rd. "24th morning had a lot of foul smelling diarrhea, vomited 1-2 times. All GI symptoms stopped. Had ginger ale pedialite, later afternoon had chicken and rice soup. Then pain in forehead, back of neck and lower back. 12/25 pain moved to spine and neck. 12/26 pain increased in spine, neck, tingling sensations in hands, feet felt cold. Pain in hips and back of knees, motion restricted.".  Neck and back pain described as severe, 12/10, "debilitating".  States currently feels better but has to constantly reposition herself. Worse pain with sitting or laying in one position for prolonged periods of time, has to move around but can't get comfortable. Patient states feels like flu symptoms but worse. Feels like GI pain moved to spine.  Not so much headache but base of neck pain, entire spine hurts. Worst pain in thoracic spine where bra strap is. Pain feels fire-y, "inflamed". Went to [facility] ER and was given medicines that "knocked her out" until 4 am. Woke up, felt worse. Thinks medicines wore off. Tried ibuprofen, tylenol at 6:30 am and pain unbearable. Went to PCP [facility] who referred back to ER. Was told she may need "x-rays and pain management". No fevers, but states she never gets fevers. Daughter doesn't get fevers either. Feels like she had an internal fever, eyes are hot. Reports rash in neck like horseshoe but states she has had rash like this in the past with other "viral" illnesses. Also had it back in February  after she had her second COVID shot. No BM since diarrhea. Passing gas. No abdominal pain. Keeping food and fluids down. Feels like a food poisoning illness. No urinary symptoms.  Will plan on calling PCP who referred patient to ER.  HPI     Past Medical History:  Diagnosis Date  . Asthma     Patient Active Problem List   Diagnosis Date Noted  . Left hip pain 12/10/2018  . Osteopenia 05/11/2018  . Bursitis of hip 02/18/2017  . 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 GERD (gastroesophageal reflux disease) 08/04/2014  . Upper airway cough syndrome 08/04/2014  . Chest discomfort 05/25/2014    Past Surgical History:  Procedure Laterality Date  . ABLATION       OB History   No obstetric history on file.     Family History  Problem Relation Age of Onset  . Breast cancer Mother   . Hypertension Mother   . Heart attack Father   . Heart disease Father   . CVA Maternal Grandmother   . Heart attack Paternal Grandfather     Social History   Tobacco Use  . Smoking status: Never Smoker  . Smokeless tobacco: Never Used  Substance Use Topics  . Alcohol use: Yes    Alcohol/week: 0.0 standard drinks    Comment: OCCASIONALLY  . Drug use: No    Home Medications Prior to Admission medications   Medication Sig  Start Date End Date Taking? Authorizing Provider  acetaminophen (TYLENOL) 500 MG tablet Take 1,000 mg by mouth in the morning, at noon, in the evening, and at bedtime.   Yes [provider]  albuterol (VENTOLIN HFA) 108 (90 Base) MCG/ACT inhaler Inhale 2 puffs into the lungs every 6 (six) hours as needed for shortness of breath or wheezing.   Yes [provider]  ALPRAZolam (XANAX) 0.25 MG tablet Take 0.25 mg by mouth 3 (three) times daily as needed for anxiety.   Yes [provider]  cyclobenzaprine (FLEXERIL) 10 MG tablet Take 10 mg by mouth 3 (three) times daily as needed for muscle spasms.   Yes [provider]  estradiol (ESTRACE) 0.5 MG tablet Take 0.5 mg by mouth daily.   Yes [provider]  ibuprofen (ADVIL) 200 MG tablet Take 200 mg by mouth every 6 (six) hours as needed for moderate pain or headache.   Yes [provider]  meloxicam (MOBIC) 15 MG tablet Take 1 tablet (15 mg total) by mouth daily. Take with food. 12/10/18  Yes Meccariello, Solmon Ice, DO  methocarbamol (ROBAXIN) 500 MG tablet Take 2 tablets (1,000 mg total) by mouth 3 (three) times daily for 7 days. 03/06/20 03/13/20 Yes Sharen Heck J, PA-C  montelukast (SINGULAIR) 10 MG tablet Take 10 mg by mouth as needed (seasonal allergies).   Yes [provider]  progesterone (PROMETRIUM) 100 MG capsule Take 100 mg by mouth every evening.   Yes [provider]    Allergies    Codeine and Prednisone  Review of Systems   Review of Systems  Constitutional: Positive for fever (subjectives).  Gastrointestinal: Positive for diarrhea (resolved) and vomiting (resolved).  Musculoskeletal: Positive for back pain, myalgias and neck pain.  Skin: Positive for rash.  All other systems reviewed and are negative.   Physical Exam Updated Vital Signs BP 125/74   Pulse 66   Temp 98.2 F (36.8 C) (Oral)   Resp 18   SpO2 99%   Physical Exam Vitals and nursing note reviewed.  Constitutional:      Appearance: She is well-developed.     Comments: Non toxic in NAD  HENT:     Head: Normocephalic and atraumatic.     Nose: Nose normal.  Eyes:     Conjunctiva/sclera: Conjunctivae normal.  Neck:     Comments: No reproducible midline or paraspinal tenderness. Non tender erythema around neck right, posterior, left.  Trachea midline. Patient able to extend, bend, rotation neck. Decreased flexion due to pain on both sides of neck Cardiovascular:     Rate and Rhythm: Normal rate and regular rhythm.  Pulmonary:     Effort: Pulmonary effort is normal.     Breath sounds: Normal breath sounds.   Abdominal:     General: Bowel sounds are normal.     Palpations: Abdomen is soft.     Tenderness: There is no abdominal tenderness.     Comments: No G/R/R. No suprapubic or CVA tenderness. Negative Murphy's and McBurney's. Active BS to lower quadrants.   Musculoskeletal:        General: Normal range of motion.     Cervical back: Normal range of motion.     Comments: No reproducible midline, paraspinal TL spine tenderness. Skin normal over the back. Patient frequently asking for me to lay her flatter, sit her up, reposition head of bed because one position for prolonged period of time makes neck and back pain worse   Skin:  General: Skin is warm and dry.     Capillary Refill: Capillary refill takes less than 2 seconds.  Neurological:     Mental Status: She is alert.     Comments:   Mental Status: Patient is awake, alert, oriented to person, place, year, and situation. Patient is able to give a clear and coherent history.  Speech is fluent and clear without dysarthria or aphasia.  No signs of neglect.  Cranial Nerves: I not tested II visual fields full bilaterally. PERRL.   III, IV, VI EOMs intact without ptosis V sensation to light touch intact in all 3 divisions of trigeminal nerve bilaterally  VII facial movements symmetric bilaterally VIII hearing intact to voice/conversation  IX, X no uvula deviation, symmetric rise of soft palate/uvula XI 5/5 SCM and trapezius strength bilaterally  XII tongue protrusion midline, symmetric L/R movements  Motor: Strength 5/5 in upper/lower extremities.   Sensation to light touch intact in face, upper/lower extremities. No pronator drift. No leg drop.  Cerebellar: No ataxia with finger to nose.  Steady gait.   Psychiatric:        Behavior: Behavior normal.     ED Results / Procedures / Treatments   Labs (all labs ordered are listed, but only abnormal results are displayed) Labs Reviewed  COMPREHENSIVE METABOLIC PANEL - Abnormal;  Notable for the following components:      Result Value   CO2 20 (*)    Glucose, Bld 133 (*)    Total Protein 6.3 (*)    All other components within normal limits  CBC  MAGNESIUM  TSH    EKG EKG Interpretation  Date/Time:  Monday March 06 2020 09:20:52 EST Ventricular Rate:  89 PR Interval:  128 QRS Duration: 72 QT Interval:  358 QTC Calculation: 435 R Axis:   79 Text Interpretation: Normal sinus rhythm Anterior infarct , age undetermined Abnormal ECG No old tracing to compare Confirmed by Sherwood Gambler [phone]) on 03/06/2020 10:29:56 AM   Radiology No results found.  Procedures Procedures (including critical care time)  Medications Ordered in ED Medications  morphine 4 MG/ML injection 4 mg (4 mg Intravenous Given 03/06/20 1150)  ketorolac (TORADOL) 30 MG/ML injection 30 mg (30 mg Intravenous Given 03/06/20 1148)  methocarbamol (ROBAXIN) 1,000 mg in dextrose 5 % 100 mL IVPB (0 mg Intravenous Stopped 03/06/20 1259)  LORazepam (ATIVAN) injection 2 mg (2 mg Intravenous Given 03/06/20 1420)  methocarbamol (ROBAXIN) 1,000 mg in dextrose 5 % 100 mL IVPB (0 mg Intravenous Stopped 03/06/20 1748)    ED Course  I have reviewed the triage vital signs and the nursing notes.  Pertinent labs & imaging results that were available during my care of the patient were reviewed by me and considered in my medical decision making (see chart for details).  Clinical Course as of 03/06/20 1817  Mon Mar 06, 2020  1134 Spoke to Dr. Leighton Ruff at [place].  Reports patient reported 12/10 pain down her spine that is worsening.  On her exam patient had a stiff neck and could not bend her neck.  She was able to do a straight leg raise but it caused severe pain down her spine.  Also had a weakness in the left arm and increased reflexes on the right side.  Was sent to the ED for further evaluation, possible neurology consultation as well as meningitis work-up. [CG]    Clinical Course User  Index [CG] Arlean Hopping   MDM Rules/Calculators/A&P  59 y.o. yo with chief complaint of neck and back pain after likely food poisoning/viral gastroenteritis.    Previous medical records available, triage and nursing notes reviewed to obtain more history and assist with MDM  Additional information obtained from Dr Leighton Ruff who I called, see above.  [facility] EDP note reviewed as well.   Differential diagnosis: viral syndrome related body aches, spasms. She has no headache, fever, confusion, generalized rash and meningitis considered less likely. Pain is in waves, worse with prolonged sitting/laying. Intermittently feels no pain. No classic of meningitis. Considered epidural abscess also less likely.   ER lab work and imaging ordered by triage RN  I have personally visualized and interpreted ER diagnostic work up including labs and imaging.    Labs reveal - normal electrolytes, LFT, creatinine, WBC  Medications ordered - toradol, robaxin, morphine  Patient shared with EDP Regenia Skeeter, recommending neurology consult given PCP concern for meningitis.   Consulted Dr Cheral Marker who saw patient in the ER. Does not think this is meningitis or any other neurological emergency. No recommendation for MRI, LP. Recommends adding TSH and Mg, giving repeat robaxin, ativan for spasms/muscular pain.   Ordered continuous cardiac and pulse ox monitoring.  Will plan for serial re-examinations. Close monitoring.   Re-evaluated the patient.   Reports improvement in pain but also quite somnolent from medicines.   Suspect viral illness myalgias.   Will discharge with robaxin, high dose NSAIDs, repeat PCP evaluation in 48 hours. Return precautions given.   Final Clinical Impression(s) / ED Diagnoses Final diagnoses:  Myalgia    Rx / DC Orders ED Discharge Orders         Ordered    methocarbamol (ROBAXIN) 500 MG tablet  3 times daily        03/06/20 1717              Kinnie Feil, PA-C 03/06/20 1817    Sherwood Gambler, MD 03/08/20 [phone]

## 2020-03-06 NOTE — Discharge Instructions (Addendum)
You were seen in the ER for neck and back pain  Lab work was normal  You were given medicines to help with body aches, spasms  Neurology evaluated you today - they suspect you  are having muscle spasms or body aches from viral illness  Take 600 mg ibuprofen plus 1000 mg acetaminophen every 6 hours for pain.  Take robaxin 1000 mg every 8 hours for spasms. Rest. Stay hydrated. Heat/hot baths.   Follow up with primary care doctor in 48 hours for re-evaluation  Return for weakness or complete numbness/loss of sensation in one side of body, confusion, generalized rash, worsening symptoms

## 2020-03-06 NOTE — ED Notes (Signed)
Called pharmacy to check on status of Robaxin ordered and requested. Was told they would tube it immediately

## 2020-03-06 NOTE — Consult Note (Signed)
NEURO HOSPITALIST CONSULT NOTE   Requestig physician: Dr. Regenia Skeeter   Reason for Consult: Waxing and waning back pain  History obtained from:  Patient, patient's spouse at bedside, and Chart     HPI:                                                                                                                                          Taylor Sharp is a 59 y.o. female re-presenting to the [facility] ED for severe back pain, after initially being seen yesterday at the [facility] ED followed by a visit to her PCP this morning 12/27. The back pain began Thursday night 12/23 into early Friday morning 12/24. Taylor Sharp claims that she had eaten kale and chicken on 12/23 that she thinks gave her food poisoning causing her to have severe bouts of diarrhea, a frontal/occipital headache rated 7/10, neck pain, nausea, and 1-2 episodes of vomiting 12/24 early morning with resolution of GI symptoms approximately at noon. She claims she felt feverish during the initial GI symptoms but did not take her temperature at home. She endorses that her head and neck pain moved down towards her midline back when her GI symptoms improved and got progressively worse over the next few days. The pain has not improved with ibuprofen or tylenol and is worse with turning and certain movements; also it improves with rest and hot baths. She states she has been able to ambulate independently and take Epsom salt baths with some improvement in her pain until 12/26 when her pain progressed and became so severe that she was unable to ambulate. She also started to notice tingling in her hands bilaterally.  12/26: Seen at [facility]; was found to have hypokalemia and was treated with Valium, Toradol, Oxycodone, and Tylenol with mild relief in back pain before discharge. Plan also included potassium repletion.  12/27: Woke up at 04:00 in severe pain, unable to get comfortable. Visited her PCP who referred  her to [facility] ED for further evaluation. In the ED, she claims that the Morphine, Robaxin, and Toradol have resulted in moderate improvements in her back pain from 10/10 to a 5/10.   Past Medical History:  Diagnosis Date  . Asthma    Past Surgical History:  Procedure Laterality Date  . ABLATION     Family History  Problem Relation Age of Onset  . Breast cancer Mother   . Hypertension Mother   . Heart attack Father   . Heart disease Father   . CVA Maternal Grandmother   . Heart attack Paternal Grandfather               Social History:  reports that she has never smoked. She has never used smokeless tobacco. She reports current alcohol  use. She reports that she does not use drugs.  Allergies  Allergen Reactions  . Codeine     "MAKES ME CRAZY"  . Prednisone     "MAKES ME KIND OF CRAZY"    MEDICATIONS:                                                                                                                     Med list in Epic is out of date per patient. Current medications at home are estrogen and progesterone as well as PRN Xanax, Tylenol and ibuprofen.   ROS:                                                                                                                                       No chest pain. No speech difficulty. No vision changes. No facial droop. No limb weakness other than perceived due to pain. No sensory loss. Other ROS as per HPI.    Blood pressure 133/72, pulse 71, temperature 98.2 F (36.8 C), temperature source Oral, resp. rate 16, SpO2 100 %.   General Examination:                                                                                                       Physical Exam  HEENT-  [place]/AT   Lungs- Respirations unlabored Extremities- No edema Musculoskeletal- Varying tenderness to palpation versus improvement in pain is achieved with temporalis massage (improves pain), posterior neck massage (improves), trapezius (worsens), upper thoracic  paraspinal massage (variable) and lumbar paraspinal massage (worsens).   Neurological Examination Mental Status: Awake, alert and fully oriented. Speech fluent with intact comprehension. Pleasant and cooperative. Pained affect at times.  Cranial Nerves: II: Visual fields intact with no extinction to DSS. PERRL.  III,IV, VI: No ptosis. EOMI.  V,VII: Smile symmetric, facial temp sensation equal bilaterally VIII: hearing intact to conversation IX,X: No hypophonia or hoarseness XI: Symmetric shoulder shrug is 5/5 XII: midline tongue extension Motor: Right : Upper extremity  5/5    Left:     Upper extremity   5/5  Lower extremity   5/5     Lower extremity   5/5 Normal limb tone throughout; no atrophy noted Increased tone of paraspinal muscles to palpation.  Sensory: Temp and light touch intact to all 4 extremities bilaterally. Normal proprioception to toes bilaterally.  Deep Tendon Reflexes: 2+ bilateral biceps and brachioradialis. 3+ bilateral patellae. 1+ bilateral achilles. Toes downgoing bilaterally. Negative Hoffman's bilaterally.  Cerebellar: No ataxia with FNF or H-S bilaterally. Testing of RAM and foot-tapping is also normal. Gait: When rising from bed and takes a few steps, she exhibits a stooped posture with stiff gait that patient states is due to her pain.   Lab Results: Basic Metabolic Panel: Recent Labs  Lab 03/05/20 1906 03/06/20 0936  NA 135 138  K 3.0* 3.5  CL 101 106  CO2 22 20*  GLUCOSE 170* 133*  BUN 10 7  CREATININE 0.77 0.80  CALCIUM 9.0 8.9    CBC: Recent Labs  Lab 03/05/20 1906 03/06/20 0936  WBC 6.6 8.3  HGB 14.3 14.3  HCT 42.4 41.2  MCV 89.6 88.2  PLT 276 295    Cardiac Enzymes: No results for input(s): CKTOTAL, CKMB, CKMBINDEX, TROPONINI in the last 168 hours.  Lipid Panel: No results for input(s): CHOL, TRIG, HDL, CHOLHDL, VLDL, LDLCALC in the last 168 hours.  Imaging: No results found.  Assessment: 59 year old female presenting with  severe back pain, partially relieved with morphine, Valium and Robaxin.  1. Waxing and waning back pain with motion restriction in the absence of focal neurological deficits; suspect due to muscle spasms from recent illness and electrolyte imbalance. Hypokalemia likely triggered initial tetany, which has persisted despite normalization of her potassium level. May also be hypomagnesemic - level not yet drawn.  2. Mild improvement in pain with Valium, Toradol, oxycodone, and Tylenol in the [facility] ED last night; moderate subjective improvements in pain with morphine, Robaxin, and Toradol today.  3. Hyperreflexia on physical exam without notable extremity weakness; likely secondary to electrolyte imbalance or anxiety, but hyperthyroidism is also on the DDx. 4. Low suspicion for CNS disorder/infection at this time, LP risks and benefits discussed at length at bedside with patient agreement that risks of LP would significantly outweigh the benefits.   Recommendations: 1. TSH level 2. With improvement of back pain with IV Robaxin in the ED, discussed initiation of additional 1,000mg  IV q6h for over 24 hours with patient at bedside; patient states she does not want to be admitted into the hospital or stay overnight. Recommend repeat 1,000mg  IV Robaxin with 2mg  Ativan and reassessment of back pain in 3-4 hours for potential discharge. 3. Maintain electrolytes 4. Recommend light stretching and rest for improvement in muscle spasms. 5. Magnesium level (ordered).     Electronically signed: Dr. Kerney Elbe 03/06/2020, 1:06 PM

## 2020-03-06 NOTE — ED Triage Notes (Addendum)
Pt reports she is here today due to headache 10/10 and spine pain . Pt reports N&V&D. Pt reports she is able to eat and drink. Pt was sent here from her PCP. Pt was seen at [facility] for same. Pt was sent for MRI of the spine and rule out meningitis.  Pt is negative for covid.

## 2020-03-09 ENCOUNTER — Other Ambulatory Visit: Payer: Self-pay

## 2020-03-09 ENCOUNTER — Inpatient Hospital Stay (HOSPITAL_COMMUNITY)
Admission: EM | Admit: 2020-03-09 | Discharge: 2020-03-11 | DRG: 301 | Disposition: A | Discharge status: Other Acute Inpatient Hospital | Payer: 59 | Attending: Internal Medicine | Admitting: Internal Medicine

## 2020-03-09 ENCOUNTER — Emergency Department (HOSPITAL_COMMUNITY): Payer: 59

## 2020-03-09 ENCOUNTER — Encounter (HOSPITAL_COMMUNITY): Payer: Self-pay

## 2020-03-09 DIAGNOSIS — Z791 Long term (current) use of non-steroidal anti-inflammatories (NSAID): Secondary | ICD-10-CM | POA: Diagnosis not present

## 2020-03-09 DIAGNOSIS — Q288 Other specified congenital malformations of circulatory system: Secondary | ICD-10-CM

## 2020-03-09 DIAGNOSIS — M8580 Other specified disorders of bone density and structure, unspecified site: Secondary | ICD-10-CM | POA: Diagnosis present

## 2020-03-09 DIAGNOSIS — M5459 Other low back pain: Secondary | ICD-10-CM | POA: Diagnosis not present

## 2020-03-09 DIAGNOSIS — Z888 Allergy status to other drugs, medicaments and biological substances status: Secondary | ICD-10-CM | POA: Diagnosis not present

## 2020-03-09 DIAGNOSIS — Z79899 Other long term (current) drug therapy: Secondary | ICD-10-CM | POA: Diagnosis not present

## 2020-03-09 DIAGNOSIS — E876 Hypokalemia: Secondary | ICD-10-CM | POA: Diagnosis present

## 2020-03-09 DIAGNOSIS — R262 Difficulty in walking, not elsewhere classified: Secondary | ICD-10-CM | POA: Diagnosis present

## 2020-03-09 DIAGNOSIS — J45909 Unspecified asthma, uncomplicated: Secondary | ICD-10-CM | POA: Diagnosis present

## 2020-03-09 DIAGNOSIS — I770 Arteriovenous fistula, acquired: Secondary | ICD-10-CM | POA: Diagnosis present

## 2020-03-09 DIAGNOSIS — R202 Paresthesia of skin: Secondary | ICD-10-CM | POA: Diagnosis present

## 2020-03-09 DIAGNOSIS — M542 Cervicalgia: Secondary | ICD-10-CM | POA: Diagnosis present

## 2020-03-09 DIAGNOSIS — R9089 Other abnormal findings on diagnostic imaging of central nervous system: Secondary | ICD-10-CM | POA: Diagnosis present

## 2020-03-09 DIAGNOSIS — Z20822 Contact with and (suspected) exposure to covid-19: Secondary | ICD-10-CM | POA: Diagnosis present

## 2020-03-09 DIAGNOSIS — Z8619 Personal history of other infectious and parasitic diseases: Secondary | ICD-10-CM

## 2020-03-09 DIAGNOSIS — I729 Aneurysm of unspecified site: Secondary | ICD-10-CM | POA: Diagnosis not present

## 2020-03-09 DIAGNOSIS — K219 Gastro-esophageal reflux disease without esophagitis: Secondary | ICD-10-CM | POA: Diagnosis present

## 2020-03-09 DIAGNOSIS — I7774 Dissection of vertebral artery: Secondary | ICD-10-CM | POA: Diagnosis present

## 2020-03-09 DIAGNOSIS — Z885 Allergy status to narcotic agent status: Secondary | ICD-10-CM

## 2020-03-09 DIAGNOSIS — M545 Low back pain, unspecified: Secondary | ICD-10-CM | POA: Diagnosis present

## 2020-03-09 LAB — URINALYSIS, ROUTINE W REFLEX MICROSCOPIC
Bacteria, UA: NONE SEEN
Bilirubin Urine: NEGATIVE
Glucose, UA: NEGATIVE mg/dL
Hgb urine dipstick: NEGATIVE
Ketones, ur: 5 mg/dL — AB
Nitrite: NEGATIVE
Protein, ur: NEGATIVE mg/dL
Specific Gravity, Urine: >1.046 — ABNORMAL HIGH (ref 1.005–1.030)
pH: 5 (ref 5.0–8.0)

## 2020-03-09 LAB — COMPREHENSIVE METABOLIC PANEL
ALT: 20 U/L (ref 0–44)
AST: 22 U/L (ref 15–41)
Albumin: 4.4 g/dL (ref 3.5–5.0)
Alkaline Phosphatase: 67 U/L (ref 38–126)
Anion gap: 13 (ref 5–15)
BUN: 11 mg/dL (ref 6–20)
CO2: 20 mmol/L — ABNORMAL LOW (ref 22–32)
Calcium: 9.5 mg/dL (ref 8.9–10.3)
Chloride: 107 mmol/L (ref 98–111)
Creatinine, Ser: 0.84 mg/dL (ref 0.44–1.00)
GFR, Estimated: >60 mL/min (ref >60)
Glucose, Bld: 106 mg/dL — ABNORMAL HIGH (ref 70–99)
Potassium: 3.7 mmol/L (ref 3.5–5.1)
Sodium: 140 mmol/L (ref 135–145)
Total Bilirubin: 1.9 mg/dL — ABNORMAL HIGH (ref 0.3–1.2)
Total Protein: 7.5 g/dL (ref 6.5–8.1)

## 2020-03-09 LAB — CBC WITH DIFFERENTIAL/PLATELET
Abs Immature Granulocytes: 0.03 10*3/uL (ref 0.00–0.07)
Basophils Absolute: 0 10*3/uL (ref 0.0–0.1)
Basophils Relative: 0 %
Eosinophils Absolute: 0 10*3/uL (ref 0.0–0.5)
Eosinophils Relative: 0 %
HCT: 41.9 % (ref 36.0–46.0)
Hemoglobin: 14.7 g/dL (ref 12.0–15.0)
Immature Granulocytes: 0 %
Lymphocytes Relative: 27 %
Lymphs Abs: 2.7 10*3/uL (ref 0.7–4.0)
MCH: 30.9 pg (ref 26.0–34.0)
MCHC: 35.1 g/dL (ref 30.0–36.0)
MCV: 88.2 fL (ref 80.0–100.0)
Monocytes Absolute: 0.7 10*3/uL (ref 0.1–1.0)
Monocytes Relative: 7 %
Neutro Abs: 6.6 10*3/uL (ref 1.7–7.7)
Neutrophils Relative %: 66 %
Platelets: 338 10*3/uL (ref 150–400)
RBC: 4.75 MIL/uL (ref 3.87–5.11)
RDW: 12.1 % (ref 11.5–15.5)
WBC: 10 10*3/uL (ref 4.0–10.5)
nRBC: 0 % (ref 0.0–0.2)

## 2020-03-09 LAB — C-REACTIVE PROTEIN: CRP: 0.6 mg/dL (ref <1.0)

## 2020-03-09 LAB — CK: Total CK: 136 U/L (ref 38–234)

## 2020-03-09 LAB — PROTIME-INR
INR: 1 (ref 0.8–1.2)
Prothrombin Time: 13.1 seconds (ref 11.4–15.2)

## 2020-03-09 LAB — LACTIC ACID, PLASMA: Lactic Acid, Venous: 1.5 mmol/L (ref 0.5–1.9)

## 2020-03-09 LAB — LIPASE, BLOOD: Lipase: 26 U/L (ref 11–51)

## 2020-03-09 LAB — SEDIMENTATION RATE: Sed Rate: 6 mm/hr (ref 0–22)

## 2020-03-09 MED ORDER — LACTATED RINGERS IV BOLUS
1000.0000 mL | Freq: Once | INTRAVENOUS | Status: AC
  Administered 2020-03-09: 1000 mL via INTRAVENOUS

## 2020-03-09 MED ORDER — HYDROMORPHONE HCL 1 MG/ML IJ SOLN: 1.0000 mg | INTRAMUSCULAR | Status: DC | PRN

## 2020-03-09 MED ORDER — HYDROMORPHONE HCL 1 MG/ML IJ SOLN
1.0000 mg | Freq: Once | INTRAMUSCULAR | Status: AC
Start: 2020-03-09 — End: 2020-03-09
  Administered 2020-03-09: 1 mg via INTRAVENOUS
  Filled 2020-03-09: qty 1

## 2020-03-09 MED ORDER — OXYCODONE-ACETAMINOPHEN 5-325 MG PO TABS: 1.0000 | ORAL_TABLET | Freq: Four times a day (QID) | ORAL | Status: DC | PRN

## 2020-03-09 MED ORDER — GADOBUTROL 1 MMOL/ML IV SOLN
6.5000 mL | Freq: Once | INTRAVENOUS | Status: AC | PRN
  Administered 2020-03-09: 6.5 mL via INTRAVENOUS

## 2020-03-09 MED ORDER — SODIUM CHLORIDE 0.9 % IV SOLN: INTRAVENOUS | Status: DC | PRN

## 2020-03-09 MED ORDER — ONDANSETRON HCL 4 MG/2ML IJ SOLN
4.0000 mg | Freq: Once | INTRAMUSCULAR | Status: AC
  Administered 2020-03-09: 4 mg via INTRAVENOUS
  Filled 2020-03-09: qty 2

## 2020-03-09 MED ORDER — ACETAMINOPHEN 325 MG PO TABS
650.0000 mg | ORAL_TABLET | Freq: Four times a day (QID) | ORAL | Status: DC | PRN
  Administered 2020-03-10: 650 mg via ORAL
  Filled 2020-03-09: qty 2

## 2020-03-09 MED ORDER — SODIUM CHLORIDE 0.9 % IV SOLN: INTRAVENOUS | Status: AC

## 2020-03-09 MED ORDER — SODIUM CHLORIDE 0.9 % IV SOLN: INTRAVENOUS | Status: DC

## 2020-03-09 MED ORDER — KETOROLAC TROMETHAMINE 30 MG/ML IJ SOLN
30.0000 mg | Freq: Once | INTRAMUSCULAR | Status: AC
  Administered 2020-03-09: 30 mg via INTRAVENOUS
  Filled 2020-03-09 (×2): qty 1

## 2020-03-09 MED ORDER — IOHEXOL 300 MG/ML  SOLN
100.0000 mL | Freq: Once | INTRAMUSCULAR | Status: AC | PRN
  Administered 2020-03-09: 100 mL via INTRAVENOUS

## 2020-03-09 MED ORDER — SODIUM CHLORIDE 0.9 % IV SOLN
2.0000 g | Freq: Two times a day (BID) | INTRAVENOUS | Status: DC
  Administered 2020-03-10 – 2020-03-11 (×4): 2 g via INTRAVENOUS
  Filled 2020-03-09 (×5): qty 20
  Filled 2020-03-09: qty 2
  Filled 2020-03-09: qty 20

## 2020-03-09 MED ORDER — VANCOMYCIN HCL 750 MG/150ML IV SOLN
750.0000 mg | Freq: Two times a day (BID) | INTRAVENOUS | Status: DC
  Administered 2020-03-10 (×2): 750 mg via INTRAVENOUS
  Filled 2020-03-09 (×3): qty 150

## 2020-03-09 MED ORDER — HYDROMORPHONE HCL 1 MG/ML IJ SOLN
1.0000 mg | Freq: Once | INTRAMUSCULAR | Status: AC
  Administered 2020-03-09: 1 mg via INTRAVENOUS
  Filled 2020-03-09 (×2): qty 1

## 2020-03-09 MED ORDER — OXYCODONE-ACETAMINOPHEN 5-325 MG PO TABS
1.0000 | ORAL_TABLET | Freq: Once | ORAL | Status: AC
  Administered 2020-03-09: 1 via ORAL
  Filled 2020-03-09: qty 1

## 2020-03-09 MED ORDER — OXYCODONE-ACETAMINOPHEN 5-325 MG PO TABS
1.0000 | ORAL_TABLET | Freq: Four times a day (QID) | ORAL | Status: AC | PRN
  Administered 2020-03-09 – 2020-03-10 (×2): 2 via ORAL
  Filled 2020-03-09 (×2): qty 2

## 2020-03-09 MED ORDER — VANCOMYCIN HCL 1250 MG/250ML IV SOLN
1250.0000 mg | Freq: Once | INTRAVENOUS | Status: AC
  Administered 2020-03-10: 1250 mg via INTRAVENOUS
  Filled 2020-03-09: qty 250

## 2020-03-09 MED ORDER — HYDROMORPHONE HCL 1 MG/ML IJ SOLN
0.5000 mg | INTRAMUSCULAR | Status: DC | PRN
  Administered 2020-03-09 – 2020-03-11 (×11): 0.5 mg via INTRAVENOUS
  Filled 2020-03-09 (×12): qty 0.5

## 2020-03-09 MED ORDER — LORAZEPAM 2 MG/ML IJ SOLN
1.0000 mg | Freq: Once | INTRAMUSCULAR | Status: AC | PRN
  Administered 2020-03-09: 1 mg via INTRAVENOUS
  Filled 2020-03-09: qty 1

## 2020-03-09 NOTE — ED Notes (Signed)
ED TO INPATIENT HANDOFF REPORT  Name/Age/Gender Taylor Sharp 59 y.o. female  Code Status   Home/SNF/Other Home  Chief Complaint 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 [R90.89]  Level of Care/Admitting Diagnosis ED Disposition    ED Disposition Condition Comment   Admit  Hospital Area: [facility] [[id number]]  Level of Care: Telemetry Medical [104]  May admit patient to [facility] or [facility] if equivalent level of care is available:: No  Covid Evaluation: Asymptomatic Screening Protocol (No Symptoms)  Diagnosis: 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 [756433]  Admitting Physician: Charlsie Quest [[id number]]  Attending Physician: Charlsie Quest [[id number]]  Estimated length of stay: past midnight tomorrow  Certification:: I certify this patient will need inpatient services for at least 2 midnights  Bed request comments: [facility] per Neurology       Medical History Past Medical History:  Diagnosis Date  . Asthma     Allergies Allergies  Allergen Reactions  . Codeine     "MAKES ME CRAZY"  . Prednisone     "MAKES ME KIND OF CRAZY"    IV Location/Drains/Wounds Patient Lines/Drains/Airways Status    Active Line/Drains/Airways    Name Placement date Placement time Site Days   Peripheral IV 03/09/20 Left Antecubital 03/09/20  1105  Antecubital  less than 1          Labs/Imaging Results for orders placed or performed during the hospital encounter of 03/09/20 (from the past 48 hour(s))  Comprehensive metabolic panel     Status: Abnormal   Collection Time: 03/09/20 11:10 AM  Result Value Ref Range   Sodium 140 135 - 145 mmol/L   Potassium 3.7 3.5 - 5.1 mmol/L   Chloride 107 98 - 111 mmol/L   CO2 20 (L) 22 - 32 mmol/L   Glucose, Bld 106 (H) 70 - 99 mg/dL    Comment: Glucose reference range applies only to samples taken after fasting for at least 8 hours.   BUN 11 6 - 20 mg/dL   Creatinine, Ser 1.60 0.44 - 1.00 mg/dL    Calcium 9.5 8.9 - 10.9 mg/dL   Total Protein 7.5 6.5 - 8.1 g/dL   Albumin 4.4 3.5 - 5.0 g/dL   AST 22 15 - 41 U/L   ALT 20 0 - 44 U/L   Alkaline Phosphatase 67 38 - 126 U/L   Total Bilirubin 1.9 (H) 0.3 - 1.2 mg/dL   GFR, Estimated >32 >35 mL/min    Comment: (NOTE) Calculated using the CKD-EPI Creatinine Equation (2021)    Anion gap 13 5 - 15    Comment: Performed at [facility], [address], [place], [place] [address]  Lipase, blood     Status: None   Collection Time: 03/09/20 11:10 AM  Result Value Ref Range   Lipase 26 11 - 51 U/L    Comment: Performed at [facility], [address] [place], [place] [address]  CBC with Differential     Status: None   Collection Time: 03/09/20 11:10 AM  Result Value Ref Range   WBC 10.0 4.0 - 10.5 K/uL   RBC 4.75 3.87 - 5.11 MIL/uL   Hemoglobin 14.7 12.0 - 15.0 g/dL   HCT 70.6 23.7 - 62.8 %   MCV 88.2 80.0 - 100.0 fL   MCH 30.9 26.0 - 34.0 pg   MCHC 35.1 30.0 - 36.0 g/dL   RDW 31.5 17.6 - 16.0 %   Platelets 338 150 -  400 K/uL   nRBC 0.0 0.0 - 0.2 %   Neutrophils Relative % 66 %   Neutro Abs 6.6 1.7 - 7.7 K/uL   Lymphocytes Relative 27 %   Lymphs Abs 2.7 0.7 - 4.0 K/uL   Monocytes Relative 7 %   Monocytes Absolute 0.7 0.1 - 1.0 K/uL   Eosinophils Relative 0 %   Eosinophils Absolute 0.0 0.0 - 0.5 K/uL   Basophils Relative 0 %   Basophils Absolute 0.0 0.0 - 0.1 K/uL   Immature Granulocytes 0 %   Abs Immature Granulocytes 0.03 0.00 - 0.07 K/uL    Comment: Performed at [facility], [place] [address], [place], [place] [address]  Protime-INR     Status: None   Collection Time: 03/09/20 11:10 AM  Result Value Ref Range   Prothrombin Time 13.1 11.4 - 15.2 seconds   INR 1.0 0.8 - 1.2    Comment: (NOTE) INR goal varies based on device and disease states. Performed at [facility], [place] [address], [place], [place] [address]   CK     Status: None   Collection  Time: 03/09/20 11:10 AM  Result Value Ref Range   Total CK 136 38 - 234 U/L    Comment: Performed at [facility], [place] [address], [place], [place] [address]  Sedimentation rate     Status: None   Collection Time: 03/09/20 11:10 AM  Result Value Ref Range   Sed Rate 6 0 - 22 mm/hr    Comment: Performed at [facility], [place] [address] [place], [place] [address]  C-reactive protein     Status: None   Collection Time: 03/09/20 11:10 AM  Result Value Ref Range   CRP 0.6 <1.0 mg/dL    Comment: Performed at [facility], [place] [address], [place], [place] [address]  Lactic acid, plasma     Status: None   Collection Time: 03/09/20 11:13 AM  Result Value Ref Range   Lactic Acid, Venous 1.5 0.5 - 1.9 mmol/L    Comment: Performed at [facility], [place] [address], [place], [place] [address]  Urinalysis, Routine w reflex microscopic Urine, Clean Catch     Status: Abnormal   Collection Time: 03/09/20  1:20 PM  Result Value Ref Range   Color, Urine YELLOW YELLOW   APPearance CLEAR CLEAR   Specific Gravity, Urine >1.046 (H) 1.005 - 1.030   pH 5.0 5.0 - 8.0   Glucose, UA NEGATIVE NEGATIVE mg/dL   Hgb urine dipstick NEGATIVE NEGATIVE   Bilirubin Urine NEGATIVE NEGATIVE   Ketones, ur 5 (A) NEGATIVE mg/dL   Protein, ur NEGATIVE NEGATIVE mg/dL   Nitrite NEGATIVE NEGATIVE   Leukocytes,Ua SMALL (A) NEGATIVE   RBC / HPF 6-10 0 - 5 RBC/hpf   WBC, UA 0-5 0 - 5 WBC/hpf   Bacteria, UA NONE SEEN NONE SEEN   Squamous Epithelial / LPF 0-5 0 - 5   Hyaline Casts, UA PRESENT     Comment: Performed at [facility], [place] [address] [place],  [address]   MR THORACIC SPINE W WO CONTRAST  Result Date: 03/09/2020 CLINICAL DATA:  Back pain several days. Additionally upper back pain now lower back pain. No fever. Negative for leukocytosis. No weakness identified. EXAM: MRI THORACIC AND LUMBAR SPINE WITHOUT AND WITH  CONTRAST TECHNIQUE: Multiplanar and multiecho pulse sequences of the thoracic and lumbar spine were obtained without and with intravenous contrast. CONTRAST:  6.48mL GADAVIST GADOBUTROL 1 MMOL/ML IV SOLN COMPARISON:  CT abdomen pelvis 03/09/2020 FINDINGS: MRI THORACIC SPINE FINDINGS Alignment:  Normal Vertebrae: Negative for fracture. No evidence of discitis or osteomyelitis. No abnormal enhancement in the vertebra. Cord: Cord hyperintensity centrally at the T4 and T5 level without expansion or abnormal enhancement. There is fatty infiltration in the dura on the right at T3-4 and on the right at T6 and T7. This is relatively mild and not causing cord compression. There are prominent areas of decreased signal within the subarachnoid space posterior to the cord in the thoracic spine. This raises the possibility of enlarged vessels from a vascular malformation however they do not seem to enhance postcontrast administration. Paraspinal and other soft tissues: No paraspinous mass or fluid collection Disc levels: Mild disc degeneration throughout the thoracic spine with mild disc degeneration but no disc protrusion. MRI LUMBAR SPINE FINDINGS Segmentation:  Normal Alignment:  Normal Vertebrae:  Normal.  No evidence of discitis osteomyelitis. Conus medullaris: Extends to the L1-2 level and appears normal. Cauda equina appears normal in the lumbar spine. Paraspinal and other soft tissues: Negative for paraspinous mass, adenopathy, or fluid collection Disc levels: Mild lumbar disc degeneration without disc protrusion or stenosis. Complex cystic mass in the sacral canal at the S1 and S2 level. Multilocular cysts are present which show mild increased signal on T1 and decreased signal on T2 but no enhancement postcontrast administration. This fills the canal without bony destruction. IMPRESSION: 1. Central cord hyperintensity at the T4-T5 level without mass lesion. Possible transverse myelitis. Also possible is a dural vascular  malformation. There are possible dilated vessels around the spinal cord in the subarachnoid space in the thoracic spine however these could be CSF flow related artifact. 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 2. Complex cystic mass in the sacral canal at S1 and S2 without abnormal enhancement. Favor a benign process such as epidermoid cyst. 3. No MRI evidence of infection in the thoracic or lumbar spine. 4. These results were called by telephone at the time of interpretation on 03/09/2020 at 3:37 pm to provider [facility] , who verbally acknowledged these results. Electronically Signed   By: Franchot Gallo M.D.   On: 03/09/2020 15:40   MR Lumbar Spine W Wo Contrast  Result Date: 03/09/2020 CLINICAL DATA:  Back pain several days. Additionally upper back pain now lower back pain. No fever. Negative for leukocytosis. No weakness identified. EXAM: MRI THORACIC AND LUMBAR SPINE WITHOUT AND WITH CONTRAST TECHNIQUE: Multiplanar and multiecho pulse sequences of the thoracic and lumbar spine were obtained without and with intravenous contrast. CONTRAST:  6.40mL GADAVIST GADOBUTROL 1 MMOL/ML IV SOLN COMPARISON:  CT abdomen pelvis 03/09/2020 FINDINGS: MRI THORACIC SPINE FINDINGS Alignment:  Normal Vertebrae: Negative for fracture. No evidence of discitis or osteomyelitis. No abnormal enhancement in the vertebra. Cord: Cord hyperintensity centrally at the T4 and T5 level without expansion or abnormal enhancement. There is fatty infiltration in the dura on the right at T3-4 and on the right at T6 and T7. This is relatively mild and not causing cord compression. There are prominent areas of decreased signal within the subarachnoid space posterior to the cord in the thoracic spine. This raises the possibility of enlarged vessels from a vascular malformation however they do not seem to enhance postcontrast administration. Paraspinal and other soft tissues: No  paraspinous mass or fluid collection Disc levels: Mild disc degeneration throughout the thoracic spine with mild disc  degeneration but no disc protrusion. MRI LUMBAR SPINE FINDINGS Segmentation:  Normal Alignment:  Normal Vertebrae:  Normal.  No evidence of discitis osteomyelitis. Conus medullaris: Extends to the L1-2 level and appears normal. Cauda equina appears normal in the lumbar spine. Paraspinal and other soft tissues: Negative for paraspinous mass, adenopathy, or fluid collection Disc levels: Mild lumbar disc degeneration without disc protrusion or stenosis. Complex cystic mass in the sacral canal at the S1 and S2 level. Multilocular cysts are present which show mild increased signal on T1 and decreased signal on T2 but no enhancement postcontrast administration. This fills the canal without bony destruction. IMPRESSION: 1. Central cord hyperintensity at the T4-T5 level without mass lesion. Possible transverse myelitis. Also possible is a dural vascular malformation. There are possible dilated vessels around the spinal cord in the subarachnoid space in the thoracic spine however these could be CSF flow related artifact. 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 2. Complex cystic mass in the sacral canal at S1 and S2 without abnormal enhancement. Favor a benign process such as epidermoid cyst. 3. No MRI evidence of infection in the thoracic or lumbar spine. 4. These results were called by telephone at the time of interpretation on 03/09/2020 at 3:37 pm to provider [facility] , who verbally acknowledged these results. Electronically Signed   By: Franchot Gallo M.D.   On: 03/09/2020 15:40   CT Abdomen Pelvis W Contrast  Result Date: 03/09/2020 CLINICAL DATA:  Back pain for several days EXAM: CT ABDOMEN AND PELVIS WITH CONTRAST TECHNIQUE: Multidetector CT imaging of the abdomen and pelvis was performed using the standard protocol following bolus  administration of intravenous contrast. CONTRAST:  161mL OMNIPAQUE IOHEXOL 300 MG/ML  SOLN COMPARISON:  None. FINDINGS: Lower chest: No acute abnormality. Hepatobiliary: No focal liver abnormality is seen. No gallstones, gallbladder wall thickening, or biliary dilatation. Pancreas: Unremarkable. No pancreatic ductal dilatation or surrounding inflammatory changes. Spleen: Normal in size without focal abnormality. Adrenals/Urinary Tract: Adrenal glands are unremarkable. Kidneys are normal, without renal calculi, focal lesion, or hydronephrosis. Bladder is unremarkable. Stomach/Bowel: Stomach is within normal limits. Appendix appears normal. No evidence of bowel wall thickening, distention, or inflammatory changes. Vascular/Lymphatic: No significant vascular findings are present. No enlarged abdominal or pelvic lymph nodes. Reproductive: Uterus and bilateral adnexa are unremarkable. Other: No abdominal wall hernia or abnormality. No abdominopelvic ascites. Musculoskeletal: No acute or significant osseous findings. IMPRESSION: 1. No acute abdominal or pelvic pathology. Electronically Signed   By: Kathreen Devoid   On: 03/09/2020 12:53    Pending Labs Unresulted Labs (From admission, onward)          Start     Ordered   03/09/20 1747  SARS CORONAVIRUS 2 (TAT 6-24 HRS) Nasopharyngeal Nasopharyngeal Swab  (Tier 3 - Asymptomatic with Precautions)  Once,   STAT       Question Answer Comment  Is this test for diagnosis or screening Screening   Symptomatic for COVID-19 as defined by CDC No   Hospitalized for COVID-19 No   Admitted to ICU for COVID-19 No   Previously tested for COVID-19 Yes   Resident in a congregate (group) care setting No   Employed in healthcare setting No   Pregnant No   Has patient completed COVID vaccination(s) (2 doses of Pfizer/Moderna 1 dose of [employer]) No      03/09/20 1746   03/09/20 1130  Culture, blood (routine x 2)  BLOOD CULTURE  X 2,   STAT      03/09/20 1130           Vitals/Pain Today's Vitals   03/09/20 1528 03/09/20 1617 03/09/20 1859 03/09/20 1909  BP:  118/75 134/77   Pulse:  78    Resp:  17    Temp:      TempSrc:      SpO2:  99%    Weight:      Height:      PainSc: 2    4     Isolation Precautions No active isolations  Medications Medications  0.9 %  sodium chloride infusion (has no administration in time range)  lactated ringers bolus 1,000 mL (0 mLs Intravenous Stopped 03/09/20 1904)  HYDROmorphone (DILAUDID) injection 1 mg (1 mg Intravenous Given 03/09/20 1157)  ondansetron (ZOFRAN) injection 4 mg (4 mg Intravenous Given 03/09/20 1157)  iohexol (OMNIPAQUE) 300 MG/ML solution 100 mL (100 mLs Intravenous Contrast Given 03/09/20 1232)  HYDROmorphone (DILAUDID) injection 1 mg (1 mg Intravenous Given 03/09/20 1359)  lactated ringers bolus 1,000 mL (0 mLs Intravenous Stopped 03/09/20 1904)  ketorolac (TORADOL) 30 MG/ML injection 30 mg (30 mg Intravenous Given 03/09/20 1359)  LORazepam (ATIVAN) injection 1 mg (1 mg Intravenous Given 03/09/20 1359)  gadobutrol (GADAVIST) 1 MMOL/ML injection 6.5 mL (6.5 mLs Intravenous Contrast Given 03/09/20 1415)  oxyCODONE-acetaminophen (PERCOCET/ROXICET) 5-325 MG per tablet 1 tablet (1 tablet Oral Given 03/09/20 1801)    Mobility walks

## 2020-03-09 NOTE — ED Notes (Signed)
Pt transported to MRI 

## 2020-03-09 NOTE — Progress Notes (Signed)
Pharmacy Antibiotic Note  Taylor Sharp is a 59 y.o. female admitted on 03/09/2020 with back pain and concern for spinal abscess vs meningitis. Pharmacy has been consulted for vancomycin and ceftriaxone dosing. Cultures pending, SCr <1, WBC and ESR/CRP wnl. Meningitis is lower on differential so no ampicillin for now per neurology.  Plan: -Ceftriaxone 2g IV q12h -Vancomycin 1259m x1 then [id number] -Follow cultures, imaging, renal function -Vancomycin levels as needed  Height: 5' 9"  (175.3 cm) Weight: 61.2 kg (135 lb) IBW/kg (Calculated) : 66.2  Temp (24hrs), Avg:97.8 F (36.6 C), Min:97.5 F (36.4 C), Max:98 F (36.7 C)  Recent Labs  Lab 03/05/20 1906 03/06/20 0936 03/09/20 1110 03/09/20 1113  WBC 6.6 8.3 10.0  --   CREATININE 0.77 0.80 0.84  --   LATICACIDVEN  --   --   --  1.5    Estimated Creatinine Clearance: 69.7 mL/min (by C-G formula based on SCr of 0.84 mg/dL).    Allergies  Allergen Reactions   Codeine     "MAKES ME CRAZY"   Prednisone     "MAKES ME KIND OF CRAZY"    Antimicrobials this admission: Vancomycin 12/30 >> Ceftriaxone 12/30 >>    Microbiology results: pending   Thank you for allowing pharmacy to be a part of this patients care.  Taylor SenatePharmD, BCPS, BCScripps Memorial Hospital - Encinitaslinical Pharmacist [phone] check AMION for all [place] 03/09/2020

## 2020-03-09 NOTE — H&P (Signed)
History and Physical    Taylor Sharp [id number] DOB: 27-Aug-1960 DOA: 03/09/2020  PCP: Lawerance Cruel, MD  Patient coming from: Home.  Chief Complaint: Back pain.  HPI: Taylor Sharp is a 59 y.o. female with no significant past medical history presents to the ER for the second time with increasing back pain.  Had come to the ER on December 27 with back pain at that time was felt to be musculoskeletal.  On December 23 patient had some chicken following which patient had nausea vomiting and diarrhea was felt to be likely viral at the time.  Following which patient start developing the mid back pain which has progressively worsened.  Despite taking the pain of the medication when patient was discharged on December 27 patient was feeling progressively worsening pain which radiates to both lower extremities and also the pain is progressing towards the neck with neck stiffness.  Denies any fever chills.  No weakness of the upper or lower extremities.  Is able to ambulate.  ED Course: In the ER patient is afebrile patient underwent MRI of the T and L-spine which was central cord hyperintensity at T4N T5 level without mass lesion differentials considerations include transverse myelitis versus a dural vascular malformation.  There was also a complex cystic mass at the sacral canal at S1-S2 without any abnormal enhancement.  Plan was to get spinal angiogram for which interventional radiology has been consulted and since there is also some concern for possible infectious source with neck rigidity and abnormal lesions patient has been started on empiric antibiotics and spinal fluid to be collected during the intervention.  Blood cultures have been obtained.  Labs are largely unremarkable.  Review of Systems: As per HPI, rest all negative.   Past Medical History:  Diagnosis Date  . Asthma     Past Surgical History:  Procedure Laterality Date  . ABLATION       reports that she has never  smoked. She has never used smokeless tobacco. She reports current alcohol use. She reports that she does not use drugs.  Allergies  Allergen Reactions  . Codeine     "MAKES ME CRAZY"  . Prednisone     "MAKES ME KIND OF CRAZY"    Family History  Problem Relation Age of Onset  . Breast cancer Mother   . Hypertension Mother   . Heart attack Father   . Heart disease Father   . CVA Maternal Grandmother   . Heart attack Paternal Grandfather     Prior to Admission medications   Medication Sig Start Date End Date Taking? Authorizing Provider  acetaminophen (TYLENOL) 500 MG tablet Take 1,000 mg by mouth in the morning, at noon, in the evening, and at bedtime.   Yes [provider]  ALPRAZolam (XANAX) 0.25 MG tablet Take 0.25 mg by mouth 3 (three) times daily as needed for anxiety.   Yes [provider]  ketorolac (TORADOL) 10 MG tablet Take 10 mg by mouth every 6 (six) hours as needed for moderate pain.   Yes [provider]  methocarbamol (ROBAXIN) 500 MG tablet Take 2 tablets (1,000 mg total) by mouth 3 (three) times daily for 7 days. 03/06/20 03/13/20 Yes Kinnie Feil, PA-C    Physical Exam: Constitutional: Moderately built and nourished. Vitals:   03/09/20 1512 03/09/20 1617 03/09/20 1859 03/09/20 2115  BP: 115/75 118/75 134/77 124/66  Pulse: 71 78  83  Resp: 12 17  18   Temp:    98  F (36.7 C)  TempSrc:    Oral  SpO2: 96% 99%  100%  Weight:      Height:       Eyes: Anicteric no pallor. ENMT: No discharge from the ears eyes nose or mouth. Neck: Neck rigidity present.  No mass felt. Respiratory: No rhonchi or crepitations. Cardiovascular: S1-S2 heard. Abdomen: Soft nontender bowel sounds present. Musculoskeletal: No edema.  Tenderness in the mid back particular in the low back. Skin: No rash. Neurologic: Alert awake oriented to time place and person.  Moves all extremities. Psychiatric: Appears normal.  Normal affect.   Labs on Admission:  I have personally reviewed following labs and imaging studies  CBC: Recent Labs  Lab 03/05/20 1906 03/06/20 0936 03/09/20 1110  WBC 6.6 8.3 10.0  NEUTROABS  --   --  6.6  HGB 14.3 14.3 14.7  HCT 42.4 41.2 41.9  MCV 89.6 88.2 88.2  PLT 276 295 338   Basic Metabolic Panel: Recent Labs  Lab 03/05/20 1906 03/06/20 0936 03/06/20 1445 03/09/20 1110  NA 135 138  --  140  K 3.0* 3.5  --  3.7  CL 101 106  --  107  CO2 22 20*  --  20*  GLUCOSE 170* 133*  --  106*  BUN 10 7  --  11  CREATININE 0.77 0.80  --  0.84  CALCIUM 9.0 8.9  --  9.5  MG  --   --  2.0  --    GFR: Estimated Creatinine Clearance: 69.7 mL/min (by C-G formula based on SCr of 0.84 mg/dL). Liver Function Tests: Recent Labs  Lab 03/05/20 1906 03/06/20 0936 03/09/20 1110  AST 24 21 22   ALT 15 15 20   ALKPHOS 71 67 67  BILITOT 1.0 1.1 1.9*  PROT 6.9 6.3* 7.5  ALBUMIN 4.1 3.7 4.4   Recent Labs  Lab 03/05/20 1906 03/09/20 1110  LIPASE 34 26   No results for input(s): AMMONIA in the last 168 hours. Coagulation Profile: Recent Labs  Lab 03/09/20 1110  INR 1.0   Cardiac Enzymes: Recent Labs  Lab 03/09/20 1110  CKTOTAL 136   BNP (last 3 results) No results for input(s): PROBNP in the last 8760 hours. HbA1C: No results for input(s): HGBA1C in the last 72 hours. CBG: No results for input(s): GLUCAP in the last 168 hours. Lipid Profile: No results for input(s): CHOL, HDL, LDLCALC, TRIG, CHOLHDL, LDLDIRECT in the last 72 hours. Thyroid Function Tests: No results for input(s): TSH, T4TOTAL, FREET4, T3FREE, THYROIDAB in the last 72 hours. Anemia Panel: No results for input(s): VITAMINB12, FOLATE, FERRITIN, TIBC, IRON, RETICCTPCT in the last 72 hours. Urine analysis:    Component Value Date/Time   COLORURINE YELLOW 03/09/2020 1320   APPEARANCEUR CLEAR 03/09/2020 1320   LABSPEC >1.046 (H) 03/09/2020 1320   PHURINE 5.0 03/09/2020 1320   GLUCOSEU NEGATIVE 03/09/2020 1320   HGBUR NEGATIVE  03/09/2020 1320   BILIRUBINUR NEGATIVE 03/09/2020 1320   KETONESUR 5 (A) 03/09/2020 1320   PROTEINUR NEGATIVE 03/09/2020 1320   NITRITE NEGATIVE 03/09/2020 1320   LEUKOCYTESUR SMALL (A) 03/09/2020 1320   Sepsis Labs: @LABRCNTIP (procalcitonin:4,lacticidven:4) ) Recent Results (from the past 240 hour(s))  Resp Panel by RT-PCR (Flu A&B, Covid) Nasopharyngeal Swab     Status: None   Collection Time: 03/05/20  7:06 PM   Specimen: Nasopharyngeal Swab; Nasopharyngeal(NP) swabs in vial transport medium  Result Value Ref Range Status   SARS Coronavirus 2 by RT PCR NEGATIVE NEGATIVE Final  Comment: (NOTE) SARS-CoV-2 target nucleic acids are NOT DETECTED.  The SARS-CoV-2 RNA is generally detectable in upper respiratory specimens during the acute phase of infection. The lowest concentration of SARS-CoV-2 viral copies this assay can detect is 138 copies/mL. A negative result does not preclude SARS-Cov-2 infection and should not be used as the sole basis for treatment or other patient management decisions. A negative result may occur with  improper specimen collection/handling, submission of specimen other than nasopharyngeal swab, presence of viral mutation(s) within the areas targeted by this assay, and inadequate number of viral copies(<138 copies/mL). A negative result must be combined with clinical observations, patient history, and epidemiological information. The expected result is Negative.  Fact Sheet for Patients:  BloggerCourse.comhttps://www.fda.gov/media/152166/download  Fact Sheet for Healthcare Providers:  SeriousBroker.ithttps://www.fda.gov/media/152162/download  This test is no t yet approved or cleared by the [place] FDA and  has been authorized for detection and/or diagnosis of SARS-CoV-2 by FDA under an Emergency Use Authorization (EUA). This EUA will remain  in effect (meaning this test can be used) for the duration of the COVID-19 declaration under Section 564(b)(1) of the Act,  21 U.S.C.section 360bbb-3(b)(1), unless the authorization is terminated  or revoked sooner.       Influenza A by PCR NEGATIVE NEGATIVE Final   Influenza B by PCR NEGATIVE NEGATIVE Final    Comment: (NOTE) The Xpert Xpress SARS-CoV-2/FLU/RSV plus assay is intended as an aid in the diagnosis of influenza from Nasopharyngeal swab specimens and should not be used as a sole basis for treatment. Nasal washings and aspirates are unacceptable for Xpert Xpress SARS-CoV-2/FLU/RSV testing.  Fact Sheet for Patients: BloggerCourse.comhttps://www.fda.gov/media/152166/download  Fact Sheet for Healthcare Providers: SeriousBroker.ithttps://www.fda.gov/media/152162/download  This test is not yet approved or cleared by the [place] FDA and has been authorized for detection and/or diagnosis of SARS-CoV-2 by FDA under an Emergency Use Authorization (EUA). This EUA will remain in effect (meaning this test can be used) for the duration of the COVID-19 declaration under Section 564(b)(1) of the Act, 21 U.S.C. section 360bbb-3(b)(1), unless the authorization is terminated or revoked.  Performed at [facility] [place], [address] [place], [place] [id number]      Radiological Exams on Admission: MR THORACIC SPINE W WO CONTRAST  Result Date: 03/09/2020 CLINICAL DATA:  Back pain several days. Additionally upper back pain now lower back pain. No fever. Negative for leukocytosis. No weakness identified. EXAM: MRI THORACIC AND LUMBAR SPINE WITHOUT AND WITH CONTRAST TECHNIQUE: Multiplanar and multiecho pulse sequences of the thoracic and lumbar spine were obtained without and with intravenous contrast. CONTRAST:  6.385mL GADAVIST GADOBUTROL 1 MMOL/ML IV SOLN COMPARISON:  CT abdomen pelvis 03/09/2020 FINDINGS: MRI THORACIC SPINE FINDINGS Alignment:  Normal Vertebrae: Negative for fracture. No evidence of discitis or osteomyelitis. No abnormal enhancement in the vertebra. Cord: Cord hyperintensity centrally at the T4 and T5 level  without expansion or abnormal enhancement. There is fatty infiltration in the dura on the right at T3-4 and on the right at T6 and T7. This is relatively mild and not causing cord compression. There are prominent areas of decreased signal within the subarachnoid space posterior to the cord in the thoracic spine. This raises the possibility of enlarged vessels from a vascular malformation however they do not seem to enhance postcontrast administration. Paraspinal and other soft tissues: No paraspinous mass or fluid collection Disc levels: Mild disc degeneration throughout the thoracic spine with mild disc degeneration but no disc protrusion. MRI LUMBAR SPINE FINDINGS Segmentation:  Normal  Alignment:  Normal Vertebrae:  Normal.  No evidence of discitis osteomyelitis. Conus medullaris: Extends to the L1-2 level and appears normal. Cauda equina appears normal in the lumbar spine. Paraspinal and other soft tissues: Negative for paraspinous mass, adenopathy, or fluid collection Disc levels: Mild lumbar disc degeneration without disc protrusion or stenosis. Complex cystic mass in the sacral canal at the S1 and S2 level. Multilocular cysts are present which show mild increased signal on T1 and decreased signal on T2 but no enhancement postcontrast administration. This fills the canal without bony destruction. IMPRESSION: 1. Central cord hyperintensity at the T4-T5 level without mass lesion. Possible transverse myelitis. Also possible is a dural vascular malformation. There are possible dilated vessels around the spinal cord in the subarachnoid space in the thoracic spine however these could be CSF flow related artifact. 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 2. Complex cystic mass in the sacral canal at S1 and S2 without abnormal enhancement. Favor a benign process such as epidermoid cyst. 3. No MRI evidence of infection in the thoracic or lumbar spine. 4. These  results were called by telephone at the time of interpretation on 03/09/2020 at 3:37 pm to provider [facility] , who verbally acknowledged these results. Electronically Signed   By: Franchot Gallo M.D.   On: 03/09/2020 15:40   MR Lumbar Spine W Wo Contrast  Result Date: 03/09/2020 CLINICAL DATA:  Back pain several days. Additionally upper back pain now lower back pain. No fever. Negative for leukocytosis. No weakness identified. EXAM: MRI THORACIC AND LUMBAR SPINE WITHOUT AND WITH CONTRAST TECHNIQUE: Multiplanar and multiecho pulse sequences of the thoracic and lumbar spine were obtained without and with intravenous contrast. CONTRAST:  6.50mL GADAVIST GADOBUTROL 1 MMOL/ML IV SOLN COMPARISON:  CT abdomen pelvis 03/09/2020 FINDINGS: MRI THORACIC SPINE FINDINGS Alignment:  Normal Vertebrae: Negative for fracture. No evidence of discitis or osteomyelitis. No abnormal enhancement in the vertebra. Cord: Cord hyperintensity centrally at the T4 and T5 level without expansion or abnormal enhancement. There is fatty infiltration in the dura on the right at T3-4 and on the right at T6 and T7. This is relatively mild and not causing cord compression. There are prominent areas of decreased signal within the subarachnoid space posterior to the cord in the thoracic spine. This raises the possibility of enlarged vessels from a vascular malformation however they do not seem to enhance postcontrast administration. Paraspinal and other soft tissues: No paraspinous mass or fluid collection Disc levels: Mild disc degeneration throughout the thoracic spine with mild disc degeneration but no disc protrusion. MRI LUMBAR SPINE FINDINGS Segmentation:  Normal Alignment:  Normal Vertebrae:  Normal.  No evidence of discitis osteomyelitis. Conus medullaris: Extends to the L1-2 level and appears normal. Cauda equina appears normal in the lumbar spine. Paraspinal and other soft tissues: Negative for paraspinous mass, adenopathy, or fluid  collection Disc levels: Mild lumbar disc degeneration without disc protrusion or stenosis. Complex cystic mass in the sacral canal at the S1 and S2 level. Multilocular cysts are present which show mild increased signal on T1 and decreased signal on T2 but no enhancement postcontrast administration. This fills the canal without bony destruction. IMPRESSION: 1. Central cord hyperintensity at the T4-T5 level without mass lesion. Possible transverse myelitis. Also possible is a dural vascular malformation. There are possible dilated vessels around the spinal cord in the subarachnoid space in the thoracic spine however these could be CSF flow related artifact. There  is dural based lipoma in the upper thoracic spine which is likely an incidental finding. 2. Complex cystic mass in the sacral canal at S1 and S2 without abnormal enhancement. Favor a benign process such as epidermoid cyst. 3. No MRI evidence of infection in the thoracic or lumbar spine. 4. These results were called by telephone at the time of interpretation on 03/09/2020 at 3:37 pm to provider [facility] , who verbally acknowledged these results. Electronically Signed   By: Franchot Gallo M.D.   On: 03/09/2020 15:40   CT Abdomen Pelvis W Contrast  Result Date: 03/09/2020 CLINICAL DATA:  Back pain for several days EXAM: CT ABDOMEN AND PELVIS WITH CONTRAST TECHNIQUE: Multidetector CT imaging of the abdomen and pelvis was performed using the standard protocol following bolus administration of intravenous contrast. CONTRAST:  115mL OMNIPAQUE IOHEXOL 300 MG/ML  SOLN COMPARISON:  None. FINDINGS: Lower chest: No acute abnormality. Hepatobiliary: No focal liver abnormality is seen. No gallstones, gallbladder wall thickening, or biliary dilatation. Pancreas: Unremarkable. No pancreatic ductal dilatation or surrounding inflammatory changes. Spleen: Normal in size without focal abnormality. Adrenals/Urinary Tract: Adrenal glands are unremarkable. Kidneys are  normal, without renal calculi, focal lesion, or hydronephrosis. Bladder is unremarkable. Stomach/Bowel: Stomach is within normal limits. Appendix appears normal. No evidence of bowel wall thickening, distention, or inflammatory changes. Vascular/Lymphatic: No significant vascular findings are present. No enlarged abdominal or pelvic lymph nodes. Reproductive: Uterus and bilateral adnexa are unremarkable. Other: No abdominal wall hernia or abnormality. No abdominopelvic ascites. Musculoskeletal: No acute or significant osseous findings. IMPRESSION: 1. No acute abdominal or pelvic pathology. Electronically Signed   By: Kathreen Devoid   On: 03/09/2020 12:53    EKG: Independently reviewed.  Normal sinus rhythm done on March 07, 2020.  Assessment/Plan Principal Problem:   Intractable low back pain Active Problems:   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   A-V fistula (HCC)    1. Intractable low back pain with MRI of the thoracolumbar spine showing central cord hyperintensity at T4 5A differentials include transverse myelitis versus dural vascular malformation.  In addition there is also complex cystic mass at the S1-S2 area with patient's exam showing neck stiffness.  Blood cultures obtained empirically start on antibiotics.  Patient will be kept n.p.o. past midnight in anticipation of spinal angiogram for which interventional radiologist Dr. Earleen Newport was consulted by the neurologist.  Given the worsening pain with abnormal MRI findings which will need further studies and interventions will need inpatient status.   DVT prophylaxis: SCDs.  Avoiding anticoagulation in anticipation of procedure. Code Status: Full code. Family Communication: Discussed with patient. Disposition Plan: Home. Consults called: Neurologist. Admission status: Inpatient.   Rise Patience MD [facility] Pager [phone].  If 7PM-7AM, please contact night-coverage [email or web address] Password  [facility]  03/09/2020, 9:26 PM

## 2020-03-09 NOTE — Progress Notes (Addendum)
MRI T-spine with mid-thoracic cord hyperintensity that is longitudinally oriented with adjacent flow void. A nearby T1-hyperintense extradural lesion possibly representing an intradural lipoma is also seen, which may be incidental but is of interest due to its close proximity to the segment of the spinal cord exhibiting the abnormal central T2 hyperintensity. DDx includes spinal dural AV-fistula, but other etiologies including intradural-extramedullary mass lesion (of note, the Radiology report states that there is no mass, but based on my review of the images a mass is possible) and intradural hemorrhage should be considered. Spinal epidural abscess is possible given abrupt symptom onset with worsening and back pain, but she has no white count - this latter etiology will need to be considered and she may benefit from IV empiric ABX if she develops a fever or a white count (WBC normal at 10 today, but has trended up from 6.6 on 12/26). Will need selective spinal angiogram tomorrow. Formal consult placed to VIR and discussed with Dr. Loreta Ave. Also discussed with EDP at [facility]. Patient will need to be admitted to [facility] under the Hospitalist service with Neurology consulting. EDP at [facility] has also communicated with Neurosurgery, but a formal Neurosurgical consultation may be needed.   Electronically signed: Dr. Caryl Pina

## 2020-03-09 NOTE — ED Provider Notes (Signed)
[facility] DEPT Provider Note   CSN: [id number] Arrival date & time: 03/09/20  [id number]     History Chief Complaint  Patient presents with  . Back Pain    Taylor Sharp is a 59 y.o. female.  HPI Patient is otherwise healthy 59 year old female.  She ate out at a restaurant 1 week ago.  She had a [place] and chicken salad for lunch.  That night she awakened with recurrent and copious diarrhea.  Reports she was up most of the night having diarrhea.  She vomited several times.  She assumed it was food poisoning.  She reports she was having some chills at the time but no fever that she is aware of.  She reports that the diarrheal symptoms were starting to improve within about 24 hours.  However, this was followed by fairly severe pain throughout her back, spine and head.  She had headache and aching through the middle of her back.  This was pretty intense.  She was trying to manage this at home.  Symptoms continue to worsen.  She was seen by her PCP and referred to the emergency department for further evaluation for possible meningitis.  Evaluation did not reveal any specific etiology and meningitis was considered of low probability.  Patient has been taking pain medications at home but reports that pain is now gotten really severe in her lower, sacral back.  She reports it started to concentrate in this area and is almost intolerable.  Comes in a wave of pain that first is a dull pain and then escalates to a very intense pain.  It is not radiating into the legs but she does perceive it in her buttocks.  She reports there is no position to get comfortable.  Standing makes it much worse.    Past Medical History:  Diagnosis Date  . Asthma     Patient Active Problem List   Diagnosis Date Noted  . Left hip pain 12/10/2018  . Osteopenia 05/11/2018  . Bursitis of hip 02/18/2017  . 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 GERD (gastroesophageal  reflux disease) 08/04/2014  . Upper airway cough syndrome 08/04/2014  . Chest discomfort 05/25/2014    Past Surgical History:  Procedure Laterality Date  . ABLATION       OB History   No obstetric history on file.     Family History  Problem Relation Age of Onset  . Breast cancer Mother   . Hypertension Mother   . Heart attack Father   . Heart disease Father   . CVA Maternal Grandmother   . Heart attack Paternal Grandfather     Social History   Tobacco Use  . Smoking status: Never Smoker  . Smokeless tobacco: Never Used  Substance Use Topics  . Alcohol use: Yes    Alcohol/week: 0.0 standard drinks    Comment: OCCASIONALLY  . Drug use: No    Home Medications Prior to Admission medications   Medication Sig Start Date End Date Taking? Authorizing Provider  acetaminophen (TYLENOL) 500 MG tablet Take 1,000 mg by mouth in the morning, at noon, in the evening, and at bedtime.   Yes [provider]  ALPRAZolam (XANAX) 0.25 MG tablet Take 0.25 mg by mouth 3 (three) times daily as needed for anxiety.   Yes [provider]  ketorolac (TORADOL) 10 MG tablet Take 10 mg by mouth every 6 (six) hours as needed for moderate pain.   Yes [provider]  methocarbamol (ROBAXIN) 500 MG tablet Take 2 tablets (1,000 mg total) by mouth 3 (three) times daily for 7 days. 03/06/20 03/13/20 Yes Kinnie Feil, PA-C    Allergies    Codeine and Prednisone  Review of Systems   Review of Systems 10 systems reviewed and negative except as per HPI Physical Exam Updated Vital Signs BP 118/75 (BP Location: Right Arm)   Pulse 78   Temp (!) 97.5 F (36.4 C) (Oral)   Resp 17   Ht 5\' 9"  (1.753 m)   Wt 61.2 kg   SpO2 99%   BMI 19.94 kg/m   Physical Exam Constitutional:      Comments: Alert.  Well-nourished well-developed.  Patient appears to be in severe discomfort and pain.  She cannot find a comfortable position.  HENT:     Mouth/Throat:     Mouth: Mucous  membranes are moist.     Pharynx: Oropharynx is clear.  Eyes:     Conjunctiva/sclera: Conjunctivae normal.     Pupils: Pupils are equal, round, and reactive to light.  Cardiovascular:     Comments: Borderline tachycardia.  No gross rub murmur gallop. Pulmonary:     Effort: Pulmonary effort is normal.     Breath sounds: Normal breath sounds.  Abdominal:     General: There is no distension.     Palpations: Abdomen is soft.     Tenderness: There is no abdominal tenderness. There is no guarding.  Musculoskeletal:     Comments: No bony point tenderness on the back.  Pain is reproducible at the right flank.  Pain more reproducible by forward flexion in the lumbar spine.  No palpable soft tissue abnormalities around the lumbar spine or buttocks.  Legs have no peripheral edema.  Calves are soft and nontender.  Skin condition very good without any wounds.  Dorsalis pedis pulses are 2+ and symmetric.  Feet are warm and dry.  Neurological:     General: No focal deficit present.     Mental Status: She is oriented to person, place, and time.     Coordination: Coordination normal.     Comments: Patellar reflexes are 2+ and symmetric.  Patient can hold both lower extremities off of the bed and resist downward pressure.  Sensation is symmetric to light touch bilateral lower extremities.  Standing position, patient can bear weight and walk about the stretcher.  She does occasionally use the stretcher for some additional support.  But she can bear full weight to ambulate.     ED Results / Procedures / Treatments   Labs (all labs ordered are listed, but only abnormal results are displayed) Labs Reviewed  COMPREHENSIVE METABOLIC PANEL - Abnormal; Notable for the following components:      Result Value   CO2 20 (*)    Glucose, Bld 106 (*)    Total Bilirubin 1.9 (*)    All other components within normal limits  URINALYSIS, ROUTINE W REFLEX MICROSCOPIC - Abnormal; Notable for the following components:    Specific Gravity, Urine >1.046 (*)    Ketones, ur 5 (*)    Leukocytes,Ua SMALL (*)    All other components within normal limits  CULTURE, BLOOD (ROUTINE X 2)  CULTURE, BLOOD (ROUTINE X 2)  LIPASE, BLOOD  LACTIC ACID, PLASMA  CBC WITH DIFFERENTIAL/PLATELET  PROTIME-INR  CK  SEDIMENTATION RATE  C-REACTIVE PROTEIN    EKG None  Radiology MR THORACIC SPINE W WO CONTRAST  Result Date: 03/09/2020 CLINICAL DATA:  Back pain several days. Additionally upper back pain now lower back pain. No fever. Negative for leukocytosis. No weakness identified. EXAM: MRI THORACIC AND LUMBAR SPINE WITHOUT AND WITH CONTRAST TECHNIQUE: Multiplanar and multiecho pulse sequences of the thoracic and lumbar spine were obtained without and with intravenous contrast. CONTRAST:  6.85mL GADAVIST GADOBUTROL 1 MMOL/ML IV SOLN COMPARISON:  CT abdomen pelvis 03/09/2020 FINDINGS: MRI THORACIC SPINE FINDINGS Alignment:  Normal Vertebrae: Negative for fracture. No evidence of discitis or osteomyelitis. No abnormal enhancement in the vertebra. Cord: Cord hyperintensity centrally at the T4 and T5 level without expansion or abnormal enhancement. There is fatty infiltration in the dura on the right at T3-4 and on the right at T6 and T7. This is relatively mild and not causing cord compression. There are prominent areas of decreased signal within the subarachnoid space posterior to the cord in the thoracic spine. This raises the possibility of enlarged vessels from a vascular malformation however they do not seem to enhance postcontrast administration. Paraspinal and other soft tissues: No paraspinous mass or fluid collection Disc levels: Mild disc degeneration throughout the thoracic spine with mild disc degeneration but no disc protrusion. MRI LUMBAR SPINE FINDINGS Segmentation:  Normal Alignment:  Normal Vertebrae:  Normal.  No evidence of discitis osteomyelitis. Conus medullaris: Extends to the L1-2 level and appears normal. Cauda equina  appears normal in the lumbar spine. Paraspinal and other soft tissues: Negative for paraspinous mass, adenopathy, or fluid collection Disc levels: Mild lumbar disc degeneration without disc protrusion or stenosis. Complex cystic mass in the sacral canal at the S1 and S2 level. Multilocular cysts are present which show mild increased signal on T1 and decreased signal on T2 but no enhancement postcontrast administration. This fills the canal without bony destruction. IMPRESSION: 1. Central cord hyperintensity at the T4-T5 level without mass lesion. Possible transverse myelitis. Also possible is a dural vascular malformation. There are possible dilated vessels around the spinal cord in the subarachnoid space in the thoracic spine however these could be CSF flow related artifact. 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 2. Complex cystic mass in the sacral canal at S1 and S2 without abnormal enhancement. Favor a benign process such as epidermoid cyst. 3. No MRI evidence of infection in the thoracic or lumbar spine. 4. These results were called by telephone at the time of interpretation on 03/09/2020 at 3:37 pm to provider [facility] , who verbally acknowledged these results. Electronically Signed   By: Franchot Gallo M.D.   On: 03/09/2020 15:40   MR Lumbar Spine W Wo Contrast  Result Date: 03/09/2020 CLINICAL DATA:  Back pain several days. Additionally upper back pain now lower back pain. No fever. Negative for leukocytosis. No weakness identified. EXAM: MRI THORACIC AND LUMBAR SPINE WITHOUT AND WITH CONTRAST TECHNIQUE: Multiplanar and multiecho pulse sequences of the thoracic and lumbar spine were obtained without and with intravenous contrast. CONTRAST:  6.102mL GADAVIST GADOBUTROL 1 MMOL/ML IV SOLN COMPARISON:  CT abdomen pelvis 03/09/2020 FINDINGS: MRI THORACIC SPINE FINDINGS Alignment:  Normal Vertebrae: Negative for fracture. No evidence of discitis or  osteomyelitis. No abnormal enhancement in the vertebra. Cord: Cord hyperintensity centrally at the T4 and T5 level without expansion or abnormal enhancement. There is fatty infiltration in the dura on the right at T3-4 and on the right at T6 and T7. This is relatively mild and not causing cord compression. There are prominent areas of decreased signal within the subarachnoid space  posterior to the cord in the thoracic spine. This raises the possibility of enlarged vessels from a vascular malformation however they do not seem to enhance postcontrast administration. Paraspinal and other soft tissues: No paraspinous mass or fluid collection Disc levels: Mild disc degeneration throughout the thoracic spine with mild disc degeneration but no disc protrusion. MRI LUMBAR SPINE FINDINGS Segmentation:  Normal Alignment:  Normal Vertebrae:  Normal.  No evidence of discitis osteomyelitis. Conus medullaris: Extends to the L1-2 level and appears normal. Cauda equina appears normal in the lumbar spine. Paraspinal and other soft tissues: Negative for paraspinous mass, adenopathy, or fluid collection Disc levels: Mild lumbar disc degeneration without disc protrusion or stenosis. Complex cystic mass in the sacral canal at the S1 and S2 level. Multilocular cysts are present which show mild increased signal on T1 and decreased signal on T2 but no enhancement postcontrast administration. This fills the canal without bony destruction. IMPRESSION: 1. Central cord hyperintensity at the T4-T5 level without mass lesion. Possible transverse myelitis. Also possible is a dural vascular malformation. There are possible dilated vessels around the spinal cord in the subarachnoid space in the thoracic spine however these could be CSF flow related artifact. 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 2. Complex cystic mass in the sacral canal at S1 and S2 without abnormal enhancement. Favor a  benign process such as epidermoid cyst. 3. No MRI evidence of infection in the thoracic or lumbar spine. 4. These results were called by telephone at the time of interpretation on 03/09/2020 at 3:37 pm to provider [facility] , who verbally acknowledged these results. Electronically Signed   By: Marlan Palau M.D.   On: 03/09/2020 15:40   CT Abdomen Pelvis W Contrast  Result Date: 03/09/2020 CLINICAL DATA:  Back pain for several days EXAM: CT ABDOMEN AND PELVIS WITH CONTRAST TECHNIQUE: Multidetector CT imaging of the abdomen and pelvis was performed using the standard protocol following bolus administration of intravenous contrast. CONTRAST:  OMNIPAQUE IOHEXOL 300 MG/ML  SOLN COMPARISON:  None. FINDINGS: Lower chest: No acute abnormality. Hepatobiliary: No focal liver abnormality is seen. No gallstones, gallbladder wall thickening, or biliary dilatation. Pancreas: Unremarkable. No pancreatic ductal dilatation or surrounding inflammatory changes. Spleen: Normal in size without focal abnormality. Adrenals/Urinary Tract: Adrenal glands are unremarkable. Kidneys are normal, without renal calculi, focal lesion, or hydronephrosis. Bladder is unremarkable. Stomach/Bowel: Stomach is within normal limits. Appendix appears normal. No evidence of bowel wall thickening, distention, or inflammatory changes. Vascular/Lymphatic: No significant vascular findings are present. No enlarged abdominal or pelvic lymph nodes. Reproductive: Uterus and bilateral adnexa are unremarkable. Other: No abdominal wall hernia or abnormality. No abdominopelvic ascites. Musculoskeletal: No acute or significant osseous findings. IMPRESSION: 1. No acute abdominal or pelvic pathology. Electronically Signed   By: Elige Ko   On: 03/09/2020 12:53    Procedures Procedures (including critical care time)  Medications Ordered in ED Medications  lactated ringers bolus 1,000 mL (1,000 mLs Intravenous New Bag/Given (Non-Interop)  03/09/20 1156)  HYDROmorphone (DILAUDID) injection 1 mg (1 mg Intravenous Given 03/09/20 1157)  ondansetron (ZOFRAN) injection 4 mg (4 mg Intravenous Given 03/09/20 1157)  iohexol (OMNIPAQUE) 300 MG/ML solution 100 mL (100 mLs Intravenous Contrast Given 03/09/20 1232)  HYDROmorphone (DILAUDID) injection 1 mg (1 mg Intravenous Given 03/09/20 1359)  lactated ringers bolus 1,000 mL (1,000 mLs Intravenous New Bag/Given (Non-Interop) 03/09/20 1320)  ketorolac (TORADOL) 30 MG/ML injection 30 mg (30 mg Intravenous Given 03/09/20  1359)  LORazepam (ATIVAN) injection 1 mg (1 mg Intravenous Given 03/09/20 1359)  gadobutrol (GADAVIST) 1 MMOL/ML injection 6.5 mL (6.5 mLs Intravenous Contrast Given 03/09/20 1415)    ED Course  I have reviewed the triage vital signs and the nursing notes.  Pertinent labs & imaging results that were available during my care of the patient were reviewed by me and considered in my medical decision making (see chart for details).    MDM Rules/Calculators/A&P                         Consult: Reviewed with Al Pimple of neurosurgery.  Advises that neurology consultation could be beneficial for possible transverse myelitis.  At this time no apparent neurosurgical interventional findings.  Could be followed up on outpatient basis from their perspective for AVM or epidural cyst.  Patient has had an atypical course of pain.  Symptoms started with what sounds like a viral gastroenteritis or acute food poisoning.  That phase improved within about 24 hours.  Patient then however went through a sequence of headache, followed by thoracic back pain and now severe lumbar pain.  The headache and thoracic back pain have since significantly abated and are no longer a primary concern.  Patient however has severe lumbar pain although no motor deficits.  Patient is afebrile without significant lab diagnostic abnormality.  Neurosurgery has been consulted.  At this point we will proceed with  neurology consultation for findings of possible transverse myelitis and further possible suggestions regarding esoteric causes of this presentation in conjunction with recent viral-like illness.  His mental status is clear.  She is neurologically intact.  After Dilaudid and Ativan for MRI, patient is not having severe pain.  She is able to rest slightly.  Her mental status however remains clear.  Dr. Roslynn Amble will follow up with remaining consult with neurology.  Final Clinical Impression(s) / ED Diagnoses Final diagnoses:  Intractable low back pain  H/O viral illness    Rx / DC Orders ED Discharge Orders    None       Charlesetta Shanks, MD 03/09/20 1642

## 2020-03-09 NOTE — Consult Note (Signed)
Neurology Consultation  Reason for Consult: Back pain, difficulty walking Referring Physician: Dr. Stevie Kern, EDP  CC: Back pain, difficulty walking  History is obtained from: Patient, chart review  HPI: Taylor Sharp is a 59 y.o. female not much in terms of significant past medical history, presented to the emergency room at [facility] for evaluation of worsening back pain that has been going on since about December 24 of this year. 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  She went home with the prescription for the muscle relaxant and over-the-counter NSAIDs with some relief initially but the back pain just did not resolve. She reports a pressure-like sensation in her lower back making it difficult to change positions, turn in bed and stand straight.  She denied any weakness in her arms and legs but reports that she feels a tingling sensation going down from her tailbone all the way to her butt cheeks bilaterally. Denies any bowel or bladder incontinence.  Also been complaining of stiffness in her neck and inability to completely flex her neck since this started. A day or so before this back pain started, she had what she describes that food as food poisoning after eating out, with a lot of diarrhea and a couple of episodes of vomiting that self resolved   Today, in the emergency room, her labs were unremarkable. Imaging of the spine-thoracic and lumbar, showed central cord hyperintensity at T4-5 level without mass lesion.  Differentials are possible transverse myelitis versus a dural vascular malformation were considered.  Complex cystic mass in the sacral canal at S1-2 without abnormal enhancement-Favor benign process such as epidermoid cyst. No clear evidence  of infection on the scan.   Case was discussed with the on-call neurologist, Dr. Otelia Limes who recommended transfer to [facility] and further evaluation with spinal angiogram.  This was also discussed with the on-call neuro interventionalist Dr. Loreta Ave.   ROS: Performed and negative except as noted in HPI  Past Medical History:  Diagnosis Date   Asthma     Family History  Problem Relation Age of Onset   Breast cancer Mother    Hypertension Mother    Heart attack Father    Heart disease Father    CVA Maternal Grandmother    Heart attack Paternal Grandfather     Social History:   reports that she has never smoked. She has never used smokeless tobacco. She reports current alcohol use. She reports that she does not use drugs.  Medications  Current Facility-Administered Medications:    0.9 %  sodium chloride infusion, , Intravenous, Continuous, Dykstra, Quitman Livings, MD   acetaminophen (TYLENOL) tablet 650 mg, 650 mg, Oral, Q6H PRN, Opyd, Lavone Neri, MD   HYDROmorphone (DILAUDID) injection 1 mg, 1 mg, Intravenous, Q4H PRN, Opyd, Lavone Neri, MD   oxyCODONE-acetaminophen (PERCOCET/ROXICET) 5-325 MG per tablet 1-2 tablet, 1-2 tablet, Oral, Q6H PRN, Opyd, Lavone Neri, MD   Exam: Current vital signs: BP 134/77    Pulse 78    Temp (!) 97.5 F (36.4 C) (Oral)    Resp 17    Ht 5\' 9"  (1.753 m)    Wt 61.2 kg    SpO2 99%    BMI 19.94 kg/m  Vital signs in last 24 hours: Temp:  [97.5 F (36.4 C)] 97.5 F (36.4  C) (12/30 0741) Pulse Rate:  [71-99] 78 (12/30 1617) Resp:  [12-21] 17 (12/30 1617) BP: (102-134)/(64-92) 134/77 (12/30 1859) SpO2:  [96 %-100 %] 99 % (12/30 1617) Weight:  [61.2 kg] 61.2 kg (12/30 0741) General: Awake alert in no distress HEENT: Normocephalic, atraumatic, dry mucous membranes.  Mild discomfort in flexing the neck but otherwise right to left no stiffness Lungs: Clear Cardiovascular: Regular rate rhythm, no murmur rub gallop Abdomen soft  nondistended nontender Extremities warm well perfused with intact pulses Neurological exam Awake alert oriented x3 No dysarthria No aphasia Cranial nerves II to XII intact Motor exam: 5/5 strength in all 4 extremities. There is palpable tenderness in the upper back. Sensory exam: Intact with no diminishing and no sensory level. Coordination: No dysmetria DTRs: 3+ patellar right leg, 3+ patellar left leg, 3+ biceps and brachioradialis right arm, 2+ biceps and brachioradialis left arm.  Toes downgoing.  No Hoffman's.  No clonus.  Labs I have reviewed labs in epic and the results pertinent to this consultation are:   CBC    Component Value Date/Time   WBC 10.0 03/09/2020 1110   RBC 4.75 03/09/2020 1110   HGB 14.7 03/09/2020 1110   HCT 41.9 03/09/2020 1110   PLT 338 03/09/2020 1110   MCV 88.2 03/09/2020 1110   MCH 30.9 03/09/2020 1110   MCHC 35.1 03/09/2020 1110   RDW 12.1 03/09/2020 1110   LYMPHSABS 2.7 03/09/2020 1110   MONOABS 0.7 03/09/2020 1110   EOSABS 0.0 03/09/2020 1110   BASOSABS 0.0 03/09/2020 1110    CMP     Component Value Date/Time   NA 140 03/09/2020 1110   K 3.7 03/09/2020 1110   CL 107 03/09/2020 1110   CO2 20 (L) 03/09/2020 1110   GLUCOSE 106 (H) 03/09/2020 1110   BUN 11 03/09/2020 1110   CREATININE 0.84 03/09/2020 1110   CALCIUM 9.5 03/09/2020 1110   PROT 7.5 03/09/2020 1110   ALBUMIN 4.4 03/09/2020 1110   AST 22 03/09/2020 1110   ALT 20 03/09/2020 1110   ALKPHOS 67 03/09/2020 1110   BILITOT 1.9 (H) 03/09/2020 1110   GFRNONAA >60 03/09/2020 1110    Lipid Panel     Component Value Date/Time   CHOL 165 05/25/2014 0951   TRIG 65.0 05/25/2014 0951   HDL 67.20 05/25/2014 0951   CHOLHDL 2 05/25/2014 0951   VLDL 13.0 05/25/2014 0951   LDLCALC 85 05/25/2014 0951     Imaging I have reviewed the images obtained: MRI examination of the thoracolumbar spine with central cord hyperintensity at T4-5 level without mass lesion with differentials that  include transverse myelitis versus dural vascular malformation.  Complex distinct mass at the sacral canal at S1-2 without abnormal enhancement  Assessment:  59 year old with no significant past medical history with worsening back pain over a week.  Imaging-MRI of the thoracolumbar spine with central cord hyperintensity at T4-5 level without mass lesion.  Differentials include transverse myelitis versus dural vascular malformation. Although the imaging not suggestive of an infectious process, that should be kept in differentials.  Recommendations: -Keep n.p.o. overnight for possible spine angiogram in the morning -Pain management per internal medicine.  -Antibiotic coverage for spinal abscess-pharmacy consult placed.  Extremely low suspicion for meningitis but some amount of neck stiffness raises a suspicion.  Antibiotic coverage as above. -Consider spinal tap while in the angio suite although suspicion for infectious process is low.  Case was discussed with Dr. Loreta Ave in the daytime by the daytime neurologist Dr. Otelia Limes.  Neurology will continue to follow.  Plan discussed with the patient and Dr. Hal Hope.   -- Amie Portland, MD [facility] Pager: [phone] If 7pm to 7am, please call on call as listed on AMION.

## 2020-03-09 NOTE — ED Triage Notes (Signed)
Pt presents with c/o back pain for several days. Pt seen for same several days ago. Pt reports that last Thursday she had food poisoning and was seen for that as well. Pt was diagnosed with a back strain on 12/26. Pt went to her MD and was told she needed an MRI. Pt unsure if she has a ruptured disc or some type of infection.

## 2020-03-09 NOTE — ED Notes (Signed)
Pt transported to CT ?

## 2020-03-09 NOTE — ED Provider Notes (Signed)
Signout note  59 year old lady with back pain.  MRI obtained of T and L-spine with and without contrast.  Central cord hyperintensity at T4-T5.  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  5:53 PM Reviewed case with Dr. 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  Recommends admission to hospitalist service, request [address].  He has reviewed case with Dr. Shella Spearing who will see patient tomorrow morning, likely angiogram. Paged [facility] for admit.     Milagros Loll, MD 03/09/20 1754

## 2020-03-10 ENCOUNTER — Inpatient Hospital Stay (HOSPITAL_COMMUNITY): Payer: 59

## 2020-03-10 DIAGNOSIS — M5459 Other low back pain: Secondary | ICD-10-CM | POA: Diagnosis not present

## 2020-03-10 DIAGNOSIS — I729 Aneurysm of unspecified site: Secondary | ICD-10-CM

## 2020-03-10 LAB — SARS CORONAVIRUS 2 (TAT 6-24 HRS): SARS Coronavirus 2: NEGATIVE

## 2020-03-10 LAB — HIV ANTIBODY (ROUTINE TESTING W REFLEX): HIV Screen 4th Generation wRfx: NONREACTIVE

## 2020-03-10 MED ORDER — GADOBUTROL 1 MMOL/ML IV SOLN
6.0000 mL | Freq: Once | INTRAVENOUS | Status: AC | PRN
  Administered 2020-03-10: 6 mL via INTRAVENOUS

## 2020-03-10 MED ORDER — OXYCODONE-ACETAMINOPHEN 5-325 MG PO TABS
1.0000 | ORAL_TABLET | Freq: Four times a day (QID) | ORAL | Status: DC | PRN
  Administered 2020-03-10 – 2020-03-11 (×5): 2 via ORAL
  Filled 2020-03-10 (×5): qty 2

## 2020-03-10 MED ORDER — IOHEXOL 350 MG/ML SOLN
100.0000 mL | Freq: Once | INTRAVENOUS | Status: AC | PRN
  Administered 2020-03-10: 100 mL via INTRAVENOUS

## 2020-03-10 MED ORDER — METHOCARBAMOL 1000 MG/10ML IJ SOLN
500.0000 mg | Freq: Four times a day (QID) | INTRAVENOUS | Status: DC | PRN
  Administered 2020-03-10 – 2020-03-11 (×3): 500 mg via INTRAVENOUS
  Filled 2020-03-10 (×8): qty 5

## 2020-03-10 MED ORDER — ONDANSETRON HCL 4 MG/2ML IJ SOLN
4.0000 mg | Freq: Four times a day (QID) | INTRAMUSCULAR | Status: DC | PRN
  Administered 2020-03-10 – 2020-03-11 (×3): 4 mg via INTRAVENOUS
  Filled 2020-03-10 (×2): qty 2

## 2020-03-10 NOTE — Progress Notes (Signed)
Results of CTA imaging of the thoracic spine were discussed with Radiology. There is subtle increased density in the dorsal thoracic spinal canal at T4-T5 which does mildly enhance following contrast administration, along with a small nodular or curvilinear focus of enhancement just inside the right T5 pedicle, which is also identified on the postcontrast MRI yesterday. No clustered or abnormal paraspinal vessels are identified. No CT evidence of intraspinal lipoma. 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  Discussed with Dr. Loreta Ave. Spinal angiogram is being scheduled for tomorrow. Will order NPO after midnight.   Electronically signed: Dr. Caryl Pina

## 2020-03-10 NOTE — Progress Notes (Signed)
Subjective: Lying in bed this evening with severe mid-thoracic and lower lumbar back pain that is well-controlled after pain medication dosing 30 minutes ago. The patient states that the medication regimen used last night, which was staggered, helped to control the pain but that she had difficulty sleeping. She would like the same staggered pain medication schedule to be used tonight.   Objective: Current vital signs: BP 135/79 (BP Location: Right Arm)   Pulse 88   Temp 99.4 F (37.4 C) (Oral)   Resp 18   Ht 5\' 9"  (1.753 m)   Wt 61.2 kg   SpO2 97%   BMI 19.94 kg/m  Vital signs in last 24 hours: Temp:  [97.8 F (36.6 C)-99.4 F (37.4 C)] 99.4 F (37.4 C) (12/31 1950) Pulse Rate:  [68-92] 88 (12/31 1950) Resp:  [18-22] 18 (12/31 1950) BP: (114-147)/(49-79) 135/79 (12/31 1950) SpO2:  [96 %-100 %] 97 % (12/31 1950)  Intake/Output from previous day: 12/30 0701 - 12/31 0700 In: 3141 [P.O.:240; I.V.:479; IV Piggyback:2422.1] Out: -  Intake/Output this shift: No intake/output data recorded. Nutritional status:  Diet Order            Diet NPO time specified  Diet effective midnight           Diet regular Room service appropriate? Yes; Fluid consistency: Thin  Diet effective now                HEENT: [place]/AT Lungs: Respirations unlabored Ext: Distal extremities warm and well perfused bilaterally.   Neurologic Exam: Ment: Alert and oriented. Speech fluent with intact comprehension. Good insight and memory. No cognitive impairment noted.  CN: Fixates and tracks normally. Face symmetric. Phonation intact.  Motor: Requests that detailed motor exam not be performed due to pain. Consents to testing of grips and distal lower extremities. Grip strength symmetric at 5/5 bilaterally. ADF and APF 5/5 bilaterally.  Sensory: Intact to FT hands and feet bilaterally.  Reflexes: Deferred due to back pain exacerbated by movement.  Cerebellar/Gat: Deferred due to back pain exacerbated by  movement.   Lab Results: Results for orders placed or performed during the hospital encounter of 03/09/20 (from the past 48 hour(s))  Comprehensive metabolic panel     Status: Abnormal   Collection Time: 03/09/20 11:10 AM  Result Value Ref Range   Sodium 140 135 - 145 mmol/L   Potassium 3.7 3.5 - 5.1 mmol/L   Chloride 107 98 - 111 mmol/L   CO2 20 (L) 22 - 32 mmol/L   Glucose, Bld 106 (H) 70 - 99 mg/dL    Comment: Glucose reference range applies only to samples taken after fasting for at least 8 hours.   BUN 11 6 - 20 mg/dL   Creatinine, Ser 03/11/20 0.44 - 1.00 mg/dL   Calcium 9.5 8.9 - 0.99 mg/dL   Total Protein 7.5 6.5 - 8.1 g/dL   Albumin 4.4 3.5 - 5.0 g/dL   AST 22 15 - 41 U/L   ALT 20 0 - 44 U/L   Alkaline Phosphatase 67 38 - 126 U/L   Total Bilirubin 1.9 (H) 0.3 - 1.2 mg/dL   GFR, Estimated 83.3 >82 mL/min    Comment: (NOTE) Calculated using the CKD-EPI Creatinine Equation (2021)    Anion gap 13 5 - 15    Comment: Performed at [facility], [address], [place], [place]  Lipase, blood     Status: None   Collection Time: 03/09/20 11:10 AM  Result Value  Ref Range   Lipase 26 11 - 51 U/L    Comment: Performed at [facility], [place] [address] [place], [place] [address]  CBC with Differential     Status: None   Collection Time: 03/09/20 11:10 AM  Result Value Ref Range   WBC 10.0 4.0 - 10.5 K/uL   RBC 4.75 3.87 - 5.11 MIL/uL   Hemoglobin 14.7 12.0 - 15.0 g/dL   HCT 41.9 36.0 - 46.0 %   MCV 88.2 80.0 - 100.0 fL   MCH 30.9 26.0 - 34.0 pg   MCHC 35.1 30.0 - 36.0 g/dL   RDW 12.1 11.5 - 15.5 %   Platelets 338 150 - 400 K/uL   nRBC 0.0 0.0 - 0.2 %   Neutrophils Relative % 66 %   Neutro Abs 6.6 1.7 - 7.7 K/uL   Lymphocytes Relative 27 %   Lymphs Abs 2.7 0.7 - 4.0 K/uL   Monocytes Relative 7 %   Monocytes Absolute 0.7 0.1 - 1.0 K/uL   Eosinophils Relative 0 %   Eosinophils Absolute 0.0 0.0 - 0.5 K/uL   Basophils Relative 0 %    Basophils Absolute 0.0 0.0 - 0.1 K/uL   Immature Granulocytes 0 %   Abs Immature Granulocytes 0.03 0.00 - 0.07 K/uL    Comment: Performed at [facility], [place] [address] [place], [place] [address]  Protime-INR     Status: None   Collection Time: 03/09/20 11:10 AM  Result Value Ref Range   Prothrombin Time 13.1 11.4 - 15.2 seconds   INR 1.0 0.8 - 1.2    Comment: (NOTE) INR goal varies based on device and disease states. Performed at [facility], [place] [address], [place], [place] [address]   CK     Status: None   Collection Time: 03/09/20 11:10 AM  Result Value Ref Range   Total CK 136 38 - 234 U/L    Comment: Performed at [facility], [place] [address], [place], [place] [address]  Sedimentation rate     Status: None   Collection Time: 03/09/20 11:10 AM  Result Value Ref Range   Sed Rate 6 0 - 22 mm/hr    Comment: Performed at [facility], [place] [address] [place], [place] [address]  C-reactive protein     Status: None   Collection Time: 03/09/20 11:10 AM  Result Value Ref Range   CRP 0.6 <1.0 mg/dL    Comment: Performed at [facility], [place] [address], [place], [place] [address]  Lactic acid, plasma     Status: None   Collection Time: 03/09/20 11:13 AM  Result Value Ref Range   Lactic Acid, Venous 1.5 0.5 - 1.9 mmol/L    Comment: Performed at [facility], [place] [address] [place], [place] [address]  Culture, blood (routine x 2)     Status: None (Preliminary result)   Collection Time: 03/09/20 12:01 PM   Specimen: Left Antecubital; Blood  Result Value Ref Range   Specimen Description      LEFT ANTECUBITAL Performed at [place] [address], [place], [place] [address]    Special Requests      BOTTLES DRAWN AEROBIC ONLY Blood Culture adequate volume Performed at [facility] [address] [place], [place]  [address]    Culture      NO GROWTH < 24 HOURS Performed at [facility] [place]  [address], [place], [place] [address]    Report Status PENDING   Culture, blood (routine x 2)     Status: None (Preliminary result)   Collection Time: 03/09/20 12:06 PM   Specimen: Right Antecubital; Blood  Result Value Ref Range   Specimen Description      RIGHT ANTECUBITAL Performed at [facility], [address], [place], [place] [address]    Special Requests      BOTTLES DRAWN AEROBIC AND ANAEROBIC Blood Culture adequate volume Performed at [facility], [address] [place], [place] [address]    Culture      NO GROWTH < 24 HOURS Performed at [facility], [address], [place], [place] [address]    Report Status PENDING   Urinalysis, Routine w reflex microscopic Urine, Clean Catch     Status: Abnormal   Collection Time: 03/09/20  1:20 PM  Result Value Ref Range   Color, Urine YELLOW YELLOW   APPearance CLEAR CLEAR   Specific Gravity, Urine >1.046 (H) 1.005 - 1.030   pH 5.0 5.0 - 8.0   Glucose, UA NEGATIVE NEGATIVE mg/dL   Hgb urine dipstick NEGATIVE NEGATIVE   Bilirubin Urine NEGATIVE NEGATIVE   Ketones, ur 5 (A) NEGATIVE mg/dL   Protein, ur NEGATIVE NEGATIVE mg/dL   Nitrite NEGATIVE NEGATIVE   Leukocytes,Ua SMALL (A) NEGATIVE   RBC / HPF 6-10 0 - 5 RBC/hpf   WBC, UA 0-5 0 - 5 WBC/hpf   Bacteria, UA NONE SEEN NONE SEEN   Squamous Epithelial / LPF 0-5 0 - 5   Hyaline Casts, UA PRESENT     Comment: Performed at [facility], [address], [place], [place] [address]  SARS CORONAVIRUS 2 (TAT 6-24 HRS) Nasopharyngeal Nasopharyngeal Swab     Status: None   Collection Time: 03/09/20  5:47 PM   Specimen: Nasopharyngeal Swab  Result Value Ref Range   SARS Coronavirus 2 NEGATIVE NEGATIVE    Comment: (NOTE) SARS-CoV-2 target nucleic acids are NOT DETECTED.  The SARS-CoV-2 RNA is generally detectable in upper and  lower respiratory specimens during the acute phase of infection. Negative results do not preclude SARS-CoV-2 infection, do not rule out co-infections with other pathogens, and should not be used as the sole basis for treatment or other patient management decisions. Negative results must be combined with clinical observations, patient history, and epidemiological information. The expected result is Negative.  Fact Sheet for Patients: HairSlick.no  Fact Sheet for Healthcare Providers: quierodirigir.com  This test is not yet approved or cleared by the [place] FDA and  has been authorized for detection and/or diagnosis of SARS-CoV-2 by FDA under an Emergency Use Authorization (EUA). This EUA will remain  in effect (meaning this test can be used) for the duration of the COVID-19 declaration under Se ction 564(b)(1) of the Act, 21 U.S.C. section 360bbb-3(b)(1), unless the authorization is terminated or revoked sooner.  Performed at [facility], [address] [place], [place] [address]   HIV Antibody (routine testing w rflx)     Status: None   Collection Time: 03/10/20  3:19 AM  Result Value Ref Range   HIV Screen 4th Generation wRfx Non Reactive Non Reactive    Comment: Performed at [facility], [address] [place], [place] [address]    Recent Results (from the past 240 hour(s))  Resp Panel by RT-PCR (Flu A&B, Covid) Nasopharyngeal Swab     Status: None  Collection Time: 03/05/20  7:06 PM   Specimen: Nasopharyngeal Swab; Nasopharyngeal(NP) swabs in vial transport medium  Result Value Ref Range Status   SARS Coronavirus 2 by RT PCR NEGATIVE NEGATIVE Final    Comment: (NOTE) SARS-CoV-2 target nucleic acids are NOT DETECTED.  The SARS-CoV-2 RNA is generally detectable in upper respiratory specimens during the acute phase of infection. The lowest concentration of SARS-CoV-2 viral copies this assay can  detect is 138 copies/mL. A negative result does not preclude SARS-Cov-2 infection and should not be used as the sole basis for treatment or other patient management decisions. A negative result may occur with  improper specimen collection/handling, submission of specimen other than nasopharyngeal swab, presence of viral mutation(s) within the areas targeted by this assay, and inadequate number of viral copies(<138 copies/mL). A negative result must be combined with clinical observations, patient history, and epidemiological information. The expected result is Negative.  Fact Sheet for Patients:  EntrepreneurPulse.com.au  Fact Sheet for Healthcare Providers:  IncredibleEmployment.be  This test is no t yet approved or cleared by the [place] FDA and  has been authorized for detection and/or diagnosis of SARS-CoV-2 by FDA under an Emergency Use Authorization (EUA). This EUA will remain  in effect (meaning this test can be used) for the duration of the COVID-19 declaration under Section 564(b)(1) of the Act, 21 U.S.C.section 360bbb-3(b)(1), unless the authorization is terminated  or revoked sooner.       Influenza A by PCR NEGATIVE NEGATIVE Final   Influenza B by PCR NEGATIVE NEGATIVE Final    Comment: (NOTE) The Xpert Xpress SARS-CoV-2/FLU/RSV plus assay is intended as an aid in the diagnosis of influenza from Nasopharyngeal swab specimens and should not be used as a sole basis for treatment. Nasal washings and aspirates are unacceptable for Xpert Xpress SARS-CoV-2/FLU/RSV testing.  Fact Sheet for Patients: EntrepreneurPulse.com.au  Fact Sheet for Healthcare Providers: IncredibleEmployment.be  This test is not yet approved or cleared by the [place] FDA and has been authorized for detection and/or diagnosis of SARS-CoV-2 by FDA under an Emergency Use Authorization (EUA). This EUA will remain in  effect (meaning this test can be used) for the duration of the COVID-19 declaration under Section 564(b)(1) of the Act, 21 U.S.C. section 360bbb-3(b)(1), unless the authorization is terminated or revoked.  Performed at [facility], [place] [place], [place] [address]   Culture, blood (routine x 2)     Status: None (Preliminary result)   Collection Time: 03/09/20 12:01 PM   Specimen: Left Antecubital; Blood  Result Value Ref Range Status   Specimen Description   Final    LEFT ANTECUBITAL Performed at [place] [address] [place], [place] [address]    Special Requests   Final    BOTTLES DRAWN AEROBIC ONLY Blood Culture adequate volume Performed at [place] [address], [place], [place] [address]    Culture   Final    NO GROWTH < 24 HOURS Performed at [place] [address], [place], [place] [address]    Report Status PENDING  Incomplete  Culture, blood (routine x 2)     Status: None (Preliminary result)   Collection Time: 03/09/20 12:06 PM   Specimen: Right Antecubital; Blood  Result Value Ref Range Status   Specimen Description   Final    RIGHT ANTECUBITAL Performed at [facility] [address], [place], [place] [address]    Special Requests  Final    BOTTLES DRAWN AEROBIC AND ANAEROBIC Blood Culture adequate volume Performed at [place] [address], [place], [place] [address]    Culture   Final    NO GROWTH < 24 HOURS Performed at [facility] [address], [place], [place] [address]    Report Status PENDING  Incomplete  SARS CORONAVIRUS 2 (TAT 6-24 HRS) Nasopharyngeal Nasopharyngeal Swab     Status: None   Collection Time: 03/09/20  5:47 PM   Specimen: Nasopharyngeal Swab  Result Value Ref Range Status   SARS Coronavirus 2 NEGATIVE NEGATIVE Final    Comment: (NOTE) SARS-CoV-2 target nucleic acids are NOT DETECTED.  The  SARS-CoV-2 RNA is generally detectable in upper and lower respiratory specimens during the acute phase of infection. Negative results do not preclude SARS-CoV-2 infection, do not rule out co-infections with other pathogens, and should not be used as the sole basis for treatment or other patient management decisions. Negative results must be combined with clinical observations, patient history, and epidemiological information. The expected result is Negative.  Fact Sheet for Patients: SugarRoll.be  Fact Sheet for Healthcare Providers: https://www.woods-mathews.com/  This test is not yet approved or cleared by the [place] FDA and  has been authorized for detection and/or diagnosis of SARS-CoV-2 by FDA under an Emergency Use Authorization (EUA). This EUA will remain  in effect (meaning this test can be used) for the duration of the COVID-19 declaration under Se ction 564(b)(1) of the Act, 21 U.S.C. section 360bbb-3(b)(1), unless the authorization is terminated or revoked sooner.  Performed at [facility], [place] [address], [place], [place] [address]     Lipid Panel No results for input(s): CHOL, TRIG, HDL, CHOLHDL, VLDL, LDLCALC in the last 72 hours.  Studies/Results: CT ANGIO CHEST PE W OR WO CONTRAST  Result Date: 03/10/2020 CLINICAL DATA:  59 year old female with abnormal thoracic spinal cord and subarachnoid space on MRI. 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 EXAM: CT ANGIOGRAPHY CHEST WITH CONTRAST TECHNIQUE: Multidetector CT imaging of the chest was performed using the standard protocol during bolus administration of intravenous contrast. Multiplanar CT image reconstructions and MIPs were obtained to evaluate the vascular anatomy. CONTRAST:  144mL OMNIPAQUE IOHEXOL 350 MG/ML SOLN COMPARISON:  MRI head and cervical spine earlier today, thoracic and lumbar spine yesterday. CT Abdomen and Pelvis yesterday. FINDINGS:  Thoracic spine: Stable thoracic vertebral height and alignment. There is mild dextroconvex scoliosis. No acute osseous abnormality identified. On precontrast images there is subtle increased density in the dorsal spinal canal at the T4-T5 level (series 14, image 36) which on MRI corresponds to an area of intermediate to increased T1 signal, dark T2 and STIR signal. Notably, there is no evidence of macroscopic fat density within the ventral spinal canal nearby at the T5-T6 level. Following contrast there does appear to be mild enhancement of the dorsal T5 region (series 10, image 61) which is more apparent on sagittal MIP (series 12, image 61). And furthermore a small nodular or curvilinear enhancing structure located just deep to the right T5 pedicle can be identified on this exam (series 6, image 49 and series 12, image 59) as well as the postcontrast MRI (series 32, image 9 of that exam). However, there do not appear to be abnormally enlarged thoracic paraspinal vessels, and no abnormally numerous or enlarged vessels entering or leaving the spinal canal can be identified. Cardiovascular: Negative thoracic aorta. Central pulmonary arteries also appear to be enhancing and patent.  No cardiomegaly or pericardial effusion. No calcified coronary artery atherosclerosis is evident. Mediastinum/Nodes: Negative.  No lymphadenopathy. Lungs/Pleura: Major airways are patent. Lungs are clear aside from minor dependent atelectasis. No pleural effusion. Upper Abdomen: By carious excretion of contrast to the gallbladder since the CT Abdomen and Pelvis yesterday. Otherwise stable, negative visible upper abdominal viscera. Musculoskeletal: No acute osseous abnormality identified. Review of the MIP images confirms the above findings. IMPRESSION: 1. Subtle increased density in the dorsal thoracic spinal canal at T4-T5 which does mildly enhance following contrast administration, along with a small nodular or curvilinear focus of  enhancement just inside the right T5 pedicle, which is also identified on the postcontrast MRI yesterday. No clustered or abnormal paraspinal vessels are identified. No CT evidence of intraspinal lipoma. 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 2. Otherwise negative CTA appearance of the chest. Electronically Signed   By: Genevie Ann M.D.   On: 03/10/2020 12:50   MR BRAIN W WO CONTRAST  Addendum Date: 03/10/2020   ADDENDUM REPORT: 03/10/2020 10:20 ADDENDUM: Study discussed by telephone with Dr. Cheral Marker on 03/10/2020 at 10:13, and also reviewed with Dr. Corrie Mckusick NIR. We discussed that the FLAIR subarachnoid signal around the brain and the possible leptomeningeal enhancement along the cervical spinal cord and cervicomedullary junction could be artifactual (3 Tesla imaging). Furthermore, we reverse viewed the thoracic spine MRI from yesterday, and that study demonstrates abnormal INTRINSIC T1 hyperintensity in the subarachnoid space along the abnormal segment of spinal cord more so than abnormal enhancement. Furthermore, the axial GRE appearance to me most resembles blood products around the spinal cord. So at this time noninvasive CTA Thoracic Spine (using chest CTA aortic timing protocol) will be pursued prior to conventional spinal angiogram. CSF analysis could still be valuable although I am now more suspicious of a vascular/hemorrhage etiology than an inflammatory or infectious process. Electronically Signed   By: Genevie Ann M.D.   On: 03/10/2020 10:20   Result Date: 03/10/2020 CLINICAL DATA:  59 year old female with back pain, difficulty walking. Abnormal signal and enhancement In the upper thoracic spinal cord, and intraspinal cystic lesions at the sacrum thought to be unrelated. Indeterminate for thoracic transverse myelitis versus spinal vascular malformation. Query evidence of CNS infection.  EXAM: MRI HEAD WITHOUT AND WITH CONTRAST MRI CERVICAL SPINE WITHOUT AND WITH CONTRAST TECHNIQUE: Multiplanar, multiecho pulse sequences of the brain and surrounding structures, and cervical spine, to include the craniocervical junction and cervicothoracic junction, were obtained without and with intravenous contrast. CONTRAST:  49mL GADAVIST GADOBUTROL 1 MMOL/ML IV SOLN COMPARISON:  Thoracic and lumbar MRI without and with contrast yesterday. FINDINGS: MRI HEAD FINDINGS Brain: Bilateral choroid plexus cysts, normal variant. Normal cerebral volume. No restricted diffusion to suggest acute infarction. No midline shift, mass effect, evidence of mass lesion, ventriculomegaly, extra-axial collection or acute intracranial hemorrhage. Cervicomedullary junction and pituitary are within normal limits. There is no ventriculomegaly or layering intraventricular debris. There is questionable incomplete suppression of CSF in some of the bilateral cerebral sulci on FLAIR imaging (series 8, image 15, 20). However, this is not apparent on sagittal FLAIR imaging (series 12). This is not associated with abnormal pachymeningeal or leptomeningeal enhancement. And underlying cerebral cortex signal appears to remain normal. There is scattered nonspecific mostly subcortical white matter small foci of T2 and FLAIR hyperintensity in both hemispheres, moderate for age. No abnormal parenchymal enhancement. No chronic cerebral blood  products. Deep gray matter nuclei remain within normal limits, but there is possible abnormal T2 hyperintensity in the periaqueductal gray matter (series 7, image 12). Mamillary bodies appear normal. Possible trace abnormal FLAIR signal in the dorsal pons on series 8, image 8, but elsewhere brainstem signal appears normal. Cerebellum within normal limits. Vascular: Major intracranial vascular flow voids are preserved. The major dural venous sinuses are enhancing and appear to be patent. Skull and upper cervical  spine: Cervical spine detailed below. Visualized bone marrow signal is within normal limits. Sinuses/Orbits: Negative orbits. Trace paranasal sinus mucosal thickening. Other: Grossly normal visible internal auditory structures. Mastoids are clear. Scalp and face soft tissues appear negative. MRI CERVICAL SPINE FINDINGS Alignment: Relatively preserved cervical lordosis. Vertebrae: No marrow edema or evidence of acute osseous abnormality. Some degenerative endplate marrow signal changes but background bone marrow signal is normal. Cord: Unfortunate motion artifact on sagittal T2 and stir sequences. And similar motion on axial T2 and GRE. No definite abnormal signal in the substance of the cervical spinal cord. No cord expansion. 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 Furthermore, the sagittal cervical spine postcontrast images raise the possibility of cerebellar folia leptomeningeal enhancement also (same image). No other spinal dural thickening. Posterior Fossa, vertebral arteries, paraspinal tissues: No definite signal abnormality within the substance of the cervicomedullary junction. Intracranial details above. Preserved major vascular flow voids in the neck. Negative visible neck soft tissues. Disc levels: Ordinary cervical spine disc and endplate degeneration from C4-C5 through C6-C7 without significant spinal stenosis. There is mild to moderate bilateral C6 neural foraminal stenosis. IMPRESSION: 1. 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 Although the dedicated brain postcontrast imaging does not suggest widespread intracranial leptomeningeal enhancement. 2. The substance of the  cervical spinal cord appears to remain normal, although detail is suboptimal due to motion on sagittal and axial images. 3. Questionable incomplete CSF suppression along the convexities on FLAIR imaging. Moderate for age nonspecific mostly subcortical white matter T2 hyperintensities, and questionable abnormal signal in the periaqueductal gray matter. No abnormal brain diffusion or parenchymal enhancement. Optic nerves appear to remain normal. 4. 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 Also a spinal meningeal infection or metastatic disease is difficult to exclude. Other diagnoses considered but felt unlikely include Wernicke Encephalopathy. CSF analysis would be valuable in this case. Electronically Signed: By: Genevie Ann M.D. On: 03/10/2020 09:56   MR CERVICAL SPINE W WO CONTRAST  Addendum Date: 03/10/2020   ADDENDUM REPORT: 03/10/2020 10:20 ADDENDUM: Study discussed by telephone with Dr. Cheral Marker on 03/10/2020 at 10:13, and also reviewed with Dr. Corrie Mckusick NIR. We discussed that the FLAIR subarachnoid signal around the brain and the possible leptomeningeal enhancement along the cervical spinal cord and cervicomedullary junction could be artifactual (3 Tesla imaging). Furthermore, we reverse viewed the thoracic spine MRI from yesterday, and that study demonstrates abnormal INTRINSIC T1 hyperintensity in the subarachnoid space along the abnormal segment of spinal cord more so than abnormal enhancement. Furthermore, the axial GRE appearance to me most resembles blood products around the spinal cord. So at this time noninvasive CTA Thoracic Spine (using chest CTA aortic timing protocol) will be pursued prior to conventional spinal angiogram. CSF analysis could still be valuable although I am now  more suspicious of a vascular/hemorrhage etiology than an inflammatory or infectious process. Electronically Signed   By: Genevie Ann M.D.   On: 03/10/2020 10:20    Result Date: 03/10/2020 CLINICAL DATA:  59 year old female with back pain, difficulty walking. Abnormal signal and enhancement In the upper thoracic spinal cord, and intraspinal cystic lesions at the sacrum thought to be unrelated. Indeterminate for thoracic transverse myelitis versus spinal vascular malformation. Query evidence of CNS infection. EXAM: MRI HEAD WITHOUT AND WITH CONTRAST MRI CERVICAL SPINE WITHOUT AND WITH CONTRAST TECHNIQUE: Multiplanar, multiecho pulse sequences of the brain and surrounding structures, and cervical spine, to include the craniocervical junction and cervicothoracic junction, were obtained without and with intravenous contrast. CONTRAST:  61mL GADAVIST GADOBUTROL 1 MMOL/ML IV SOLN COMPARISON:  Thoracic and lumbar MRI without and with contrast yesterday. FINDINGS: MRI HEAD FINDINGS Brain: Bilateral choroid plexus cysts, normal variant. Normal cerebral volume. No restricted diffusion to suggest acute infarction. No midline shift, mass effect, evidence of mass lesion, ventriculomegaly, extra-axial collection or acute intracranial hemorrhage. Cervicomedullary junction and pituitary are within normal limits. There is no ventriculomegaly or layering intraventricular debris. There is questionable incomplete suppression of CSF in some of the bilateral cerebral sulci on FLAIR imaging (series 8, image 15, 20). However, this is not apparent on sagittal FLAIR imaging (series 12). This is not associated with abnormal pachymeningeal or leptomeningeal enhancement. And underlying cerebral cortex signal appears to remain normal. There is scattered nonspecific mostly subcortical white matter small foci of T2 and FLAIR hyperintensity in both hemispheres, moderate for age. No abnormal parenchymal enhancement. No chronic cerebral blood products. Deep gray matter nuclei remain within normal limits, but there is possible abnormal T2 hyperintensity in the periaqueductal gray matter (series 7, image 12).  Mamillary bodies appear normal. Possible trace abnormal FLAIR signal in the dorsal pons on series 8, image 8, but elsewhere brainstem signal appears normal. Cerebellum within normal limits. Vascular: Major intracranial vascular flow voids are preserved. The major dural venous sinuses are enhancing and appear to be patent. Skull and upper cervical spine: Cervical spine detailed below. Visualized bone marrow signal is within normal limits. Sinuses/Orbits: Negative orbits. Trace paranasal sinus mucosal thickening. Other: Grossly normal visible internal auditory structures. Mastoids are clear. Scalp and face soft tissues appear negative. MRI CERVICAL SPINE FINDINGS Alignment: Relatively preserved cervical lordosis. Vertebrae: No marrow edema or evidence of acute osseous abnormality. Some degenerative endplate marrow signal changes but background bone marrow signal is normal. Cord: Unfortunate motion artifact on sagittal T2 and stir sequences. And similar motion on axial T2 and GRE. No definite abnormal signal in the substance of the cervical spinal cord. No cord expansion. 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 Furthermore, the sagittal cervical spine postcontrast images raise the possibility of cerebellar folia leptomeningeal enhancement also (same image). No other spinal dural thickening. Posterior Fossa, vertebral arteries, paraspinal tissues: No definite signal abnormality within the substance of the cervicomedullary junction. Intracranial details above. Preserved major vascular flow voids in the neck. Negative visible neck soft tissues. Disc levels: Ordinary cervical spine disc and endplate degeneration from C4-C5 through C6-C7 without significant spinal stenosis. There is mild to moderate  bilateral C6 neural foraminal stenosis. IMPRESSION: 1. 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 Although the dedicated brain postcontrast imaging does  not suggest widespread intracranial leptomeningeal enhancement. 2. The substance of the cervical spinal cord appears to remain normal, although detail is suboptimal due to motion on sagittal and axial images. 3. Questionable incomplete CSF suppression along the convexities on FLAIR imaging. Moderate for age nonspecific mostly subcortical white matter T2 hyperintensities, and questionable abnormal signal in the periaqueductal gray matter. No abnormal brain diffusion or parenchymal enhancement. Optic nerves appear to remain normal. 4. 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 Also a spinal meningeal infection or metastatic disease is difficult to exclude. Other diagnoses considered but felt unlikely include Wernicke Encephalopathy. CSF analysis would be valuable in this case. Electronically Signed: By: Genevie Ann M.D. On: 03/10/2020 09:56   MR THORACIC SPINE W WO CONTRAST  Result Date: 03/09/2020 CLINICAL DATA:  Back pain several days. Additionally upper back pain now lower back pain. No fever. Negative for leukocytosis. No weakness identified. EXAM: MRI THORACIC AND LUMBAR SPINE WITHOUT AND WITH CONTRAST TECHNIQUE: Multiplanar and multiecho pulse sequences of the thoracic and lumbar spine were obtained without and with intravenous contrast. CONTRAST:  6.72mL GADAVIST GADOBUTROL 1 MMOL/ML IV SOLN COMPARISON:  CT abdomen pelvis 03/09/2020 FINDINGS: MRI THORACIC SPINE FINDINGS Alignment:  Normal Vertebrae: Negative for fracture. No evidence of discitis or osteomyelitis. No abnormal enhancement in the vertebra. Cord: Cord hyperintensity centrally at the T4 and T5 level without expansion or abnormal enhancement.  There is fatty infiltration in the dura on the right at T3-4 and on the right at T6 and T7. This is relatively mild and not causing cord compression. There are prominent areas of decreased signal within the subarachnoid space posterior to the cord in the thoracic spine. This raises the possibility of enlarged vessels from a vascular malformation however they do not seem to enhance postcontrast administration. Paraspinal and other soft tissues: No paraspinous mass or fluid collection Disc levels: Mild disc degeneration throughout the thoracic spine with mild disc degeneration but no disc protrusion. MRI LUMBAR SPINE FINDINGS Segmentation:  Normal Alignment:  Normal Vertebrae:  Normal.  No evidence of discitis osteomyelitis. Conus medullaris: Extends to the L1-2 level and appears normal. Cauda equina appears normal in the lumbar spine. Paraspinal and other soft tissues: Negative for paraspinous mass, adenopathy, or fluid collection Disc levels: Mild lumbar disc degeneration without disc protrusion or stenosis. Complex cystic mass in the sacral canal at the S1 and S2 level. Multilocular cysts are present which show mild increased signal on T1 and decreased signal on T2 but no enhancement postcontrast administration. This fills the canal without bony destruction. IMPRESSION: 1. Central cord hyperintensity at the T4-T5 level without mass lesion. Possible transverse myelitis. Also possible is a dural vascular malformation. There are possible dilated vessels around the spinal cord in the subarachnoid space in the thoracic spine however these could be CSF flow related artifact. 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 2. Complex cystic mass in the sacral canal at S1 and S2 without abnormal enhancement. Favor a benign process such as epidermoid cyst. 3. No MRI evidence of infection in the thoracic or lumbar spine. 4. These results were called by telephone at the time of  interpretation on 03/09/2020 at 3:37 pm to provider [facility] , who verbally acknowledged these results. Electronically Signed   By: Franchot Gallo M.D.   On: 03/09/2020 15:40   MR Lumbar Spine W Wo Contrast  Result Date: 03/09/2020 CLINICAL DATA:  Back pain  several days. Additionally upper back pain now lower back pain. No fever. Negative for leukocytosis. No weakness identified. EXAM: MRI THORACIC AND LUMBAR SPINE WITHOUT AND WITH CONTRAST TECHNIQUE: Multiplanar and multiecho pulse sequences of the thoracic and lumbar spine were obtained without and with intravenous contrast. CONTRAST:  6.49mL GADAVIST GADOBUTROL 1 MMOL/ML IV SOLN COMPARISON:  CT abdomen pelvis 03/09/2020 FINDINGS: MRI THORACIC SPINE FINDINGS Alignment:  Normal Vertebrae: Negative for fracture. No evidence of discitis or osteomyelitis. No abnormal enhancement in the vertebra. Cord: Cord hyperintensity centrally at the T4 and T5 level without expansion or abnormal enhancement. There is fatty infiltration in the dura on the right at T3-4 and on the right at T6 and T7. This is relatively mild and not causing cord compression. There are prominent areas of decreased signal within the subarachnoid space posterior to the cord in the thoracic spine. This raises the possibility of enlarged vessels from a vascular malformation however they do not seem to enhance postcontrast administration. Paraspinal and other soft tissues: No paraspinous mass or fluid collection Disc levels: Mild disc degeneration throughout the thoracic spine with mild disc degeneration but no disc protrusion. MRI LUMBAR SPINE FINDINGS Segmentation:  Normal Alignment:  Normal Vertebrae:  Normal.  No evidence of discitis osteomyelitis. Conus medullaris: Extends to the L1-2 level and appears normal. Cauda equina appears normal in the lumbar spine. Paraspinal and other soft tissues: Negative for paraspinous mass, adenopathy, or fluid collection Disc levels: Mild lumbar disc  degeneration without disc protrusion or stenosis. Complex cystic mass in the sacral canal at the S1 and S2 level. Multilocular cysts are present which show mild increased signal on T1 and decreased signal on T2 but no enhancement postcontrast administration. This fills the canal without bony destruction. IMPRESSION: 1. Central cord hyperintensity at the T4-T5 level without mass lesion. Possible transverse myelitis. Also possible is a dural vascular malformation. There are possible dilated vessels around the spinal cord in the subarachnoid space in the thoracic spine however these could be CSF flow related artifact. 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 2. Complex cystic mass in the sacral canal at S1 and S2 without abnormal enhancement. Favor a benign process such as epidermoid cyst. 3. No MRI evidence of infection in the thoracic or lumbar spine. 4. These results were called by telephone at the time of interpretation on 03/09/2020 at 3:37 pm to provider [facility] , who verbally acknowledged these results. Electronically Signed   By: Franchot Gallo M.D.   On: 03/09/2020 15:40   CT Abdomen Pelvis W Contrast  Result Date: 03/09/2020 CLINICAL DATA:  Back pain for several days EXAM: CT ABDOMEN AND PELVIS WITH CONTRAST TECHNIQUE: Multidetector CT imaging of the abdomen and pelvis was performed using the standard protocol following bolus administration of intravenous contrast. CONTRAST:  14mL OMNIPAQUE IOHEXOL 300 MG/ML  SOLN COMPARISON:  None. FINDINGS: Lower chest: No acute abnormality. Hepatobiliary: No focal liver abnormality is seen. No gallstones, gallbladder wall thickening, or biliary dilatation. Pancreas: Unremarkable. No pancreatic ductal dilatation or surrounding inflammatory changes. Spleen: Normal in size without focal abnormality. Adrenals/Urinary Tract: Adrenal glands are unremarkable. Kidneys are normal, without renal calculi, focal  lesion, or hydronephrosis. Bladder is unremarkable. Stomach/Bowel: Stomach is within normal limits. Appendix appears normal. No evidence of bowel wall thickening, distention, or inflammatory changes. Vascular/Lymphatic: No significant vascular findings are present. No enlarged abdominal or pelvic lymph nodes. Reproductive: Uterus and bilateral adnexa are unremarkable. Other: No abdominal wall  hernia or abnormality. No abdominopelvic ascites. Musculoskeletal: No acute or significant osseous findings. IMPRESSION: 1. No acute abdominal or pelvic pathology. Electronically Signed   By: Kathreen Devoid   On: 03/09/2020 12:53    Medications:  Scheduled:  Continuous: . sodium chloride 125 mL/hr at 03/10/20 0730  . sodium chloride Stopped (03/10/20 0329)  . cefTRIAXone (ROCEPHIN)  IV 2 g (03/10/20 1349)  . methocarbamol (ROBAXIN) IV 500 mg (03/10/20 1612)  . vancomycin 750 mg (03/10/20 1233)    Assessment: 59 year old female with no significant past medical history with worsening back pain over a week. 1. No motor or sensory deficits on serial neurological exams. Severe back pain centered at midthoracic and lower lumbar levels corresponds to the approximate locations of the probable hemorrhage seen within the spinal canal intradurally at these levels.  2. Imaging-MRI of the thoracolumbar spine with central cord hyperintensity at T4-5 level without mass lesion. Differentials include transverse myelitis versus dural vascular malformation, the latter now felt to be more likely based on discussions between Neurology, Interventional Radiology and Neuroradiology.  3. Although the imaging not suggestive of an infectious process, that should be kept in differentials. She continues on empiric antibiotics. Holding off on LP for now due to risk of possible recurrence of hemorrhage should the spinal needle puncture a vascular structure. May be able to perform LP after anatomy is further characterized by catheter based  spinal angiogram, which is scheduled for tomorrow.  4. Results of CTA imaging of the thoracic spine were discussed with Radiology. There is subtle increased density in the dorsal thoracic spinal canal at T4-T5 which does mildly enhance following contrast administration, along with a small nodular or curvilinear focus of enhancement just inside the right T5 pedicle, which is also identified on the postcontrast MRI yesterday. No clustered or abnormal paraspinal vessels are identified. No CT evidence of intraspinal lipoma. 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  Recommendations: - NPO after midnight for spinal angiogram in the morning - Pain management per internal medicine.  - Continuing antibiotic coverage for spinal abscess-pharmacy consult placed.  Extremely low suspicion for meningitis but some amount of neck stiffness raises a suspicion.  Antibiotic coverage as above. -.On exam this evening, severe mid-thoracic and lower lumbar back pain is well-controlled after pain medication dosing 30 minutes ago. The patient states that the medication regimen used last night, which was staggered, helped to control the pain but that she had difficulty sleeping. She would like the same staggered pain medication schedule to be used tonight. Discussed with RN. Staggered pain medication orders are in place.     LOS: 1 day   @Electronically  signed: Dr. Kerney Elbe 03/10/2020  8:23 PM

## 2020-03-10 NOTE — Progress Notes (Signed)
Chief Complaint: Back pain  Referring Physician(s): Dr Cheral Marker  Supervising Physician: Corrie Mckusick  Patient Status: [facility] - In-pt  History of Present Illness: Jury Caserta is a 59 y.o. female referred to the [facility] service as a possible candidate for diagnostic spinal angiogram.   I met Ms Furio with her husband in the room.   They confirm that her initial symptom was multiple episodes of diarrhea with nausea and vomiting starting on the evening/overnight of December 23rd, which worsened on December 24th.  She thought she might have had gotten food poisoning.  She then started to have associated head ache and back pain, which have worsened over the past few days.    She tells me currently the head ache and back pain are 5/10 intensity, and feel like a deep achy quality.  There is associated stiffness in her neck and back.  She tells me she is usually quite mobile.   Imaging performed at [facility] includes abd/pelvis CT, which was negative as well as neuro-imaging.  MRI of thoracic spine shows cord hyperintensity at T4-T5, possibly transverse myelitis.  Additionally, there are hypointense serpiginous structures in the CSF which may be dilated veins versus pulsation artifact.  If these are veins, there could then be underlying AV malformation, such as fistula.  I have reviewed the images and discussed with my colleagues of Neuroradiology and Neurology.  We agree at this time the neuro-imaging is non-diagnostic of abnormal vessels.  Also, there is focus of T1 signal, which could be either fat/lipoma, or hemorrhage.   Past Medical History:  Diagnosis Date  . Asthma     Past Surgical History:  Procedure Laterality Date  . ABLATION      Allergies: Codeine and Prednisone  Medications: Prior to Admission medications   Medication Sig Start Date End Date Taking? Authorizing Provider  acetaminophen (TYLENOL) 500 MG tablet Take 1,000 mg by mouth in the morning, at noon, in the evening,  and at bedtime.   Yes [provider]  ALPRAZolam (XANAX) 0.25 MG tablet Take 0.25 mg by mouth 3 (three) times daily as needed for anxiety.   Yes [provider]  ketorolac (TORADOL) 10 MG tablet Take 10 mg by mouth every 6 (six) hours as needed for moderate pain.   Yes [provider]  methocarbamol (ROBAXIN) 500 MG tablet Take 2 tablets (1,000 mg total) by mouth 3 (three) times daily for 7 days. 03/06/20 03/13/20 Yes Kinnie Feil, PA-C     Family History  Problem Relation Age of Onset  . Breast cancer Mother   . Hypertension Mother   . Heart attack Father   . Heart disease Father   . CVA Maternal Grandmother   . Heart attack Paternal Grandfather     Social History   Socioeconomic History  . Marital status: Married    Spouse name: Not on file  . Number of children: Not on file  . Years of education: Not on file  . Highest education level: Not on file  Occupational History  . Not on file  Tobacco Use  . Smoking status: Never Smoker  . Smokeless tobacco: Never Used  Substance and Sexual Activity  . Alcohol use: Yes    Alcohol/week: 0.0 standard drinks    Comment: OCCASIONALLY  . Drug use: No  . Sexual activity: Not on file  Other Topics Concern  . Not on file  Social History Narrative  . Not on file   Social Determinants of Health  Financial Resource Strain: Not on file  Food Insecurity: Not on file  Transportation Needs: Not on file  Physical Activity: Not on file  Stress: Not on file  Social Connections: Not on file       Review of Systems: A 12 point ROS discussed and pertinent positives are indicated in the HPI above.  All other systems are negative.  Review of Systems  Vital Signs: BP 114/66 (BP Location: Right Arm)   Pulse 83   Temp 98.3 F (36.8 C) (Oral)   Resp 18   Ht 5' 9"  (1.753 m)   Wt 61.2 kg   SpO2 96%   BMI 19.94 kg/m   Physical Exam General: 59 yo female appearing younger than stated age.   Well-developed, well-nourished.  Ill-appearing/uncomfortable. HEENT: Atraumatic, normocephalic.  Conjugate gaze, extra-ocular motor intact. No scleral icterus or scleral injection. No lesions on external ears, nose, lips, or gums.  Oral mucosa moist, pink.  Neck: Symmetric with no goiter enlargement.  Chest/Lungs:  Symmetric chest with inspiration/expiration.  No labored breathing.  Marland Kitchen  Heart:    No JVD appreciated.  Abdomen:  Soft, NT/ND, with + bowel sounds.   Genito-urinary: Deferred Neurologic: Alert & Oriented to person, place, and time.   Normal affect and insight.  Appropriate questions.  Moving all 4 extremities with gross sensory intact.  Upper and lower extremity demonstrates 5/5 strength.  Flexing the cervical spine causes discomfort. She is able to extend the hips and knees to lay flat without exacerbation.  Extremities: No wound.  Capillary refill prompt.   Imaging: MR BRAIN W WO CONTRAST  Addendum Date: 03/10/2020   ADDENDUM REPORT: 03/10/2020 10:20 ADDENDUM: Study discussed by telephone with Dr. Cheral Marker on 03/10/2020 at 10:13, and also reviewed with Dr. Corrie Mckusick NIR. We discussed that the FLAIR subarachnoid signal around the brain and the possible leptomeningeal enhancement along the cervical spinal cord and cervicomedullary junction could be artifactual (3 Tesla imaging). Furthermore, we reverse viewed the thoracic spine MRI from yesterday, and that study demonstrates abnormal INTRINSIC T1 hyperintensity in the subarachnoid space along the abnormal segment of spinal cord more so than abnormal enhancement. Furthermore, the axial GRE appearance to me most resembles blood products around the spinal cord. So at this time noninvasive CTA Thoracic Spine (using chest CTA aortic timing protocol) will be pursued prior to conventional spinal angiogram. CSF analysis could still be valuable although I am now more suspicious of a vascular/hemorrhage etiology than an inflammatory or infectious  process. Electronically Signed   By: Genevie Ann M.D.   On: 03/10/2020 10:20   Result Date: 03/10/2020 CLINICAL DATA:  59 year old female with back pain, difficulty walking. Abnormal signal and enhancement In the upper thoracic spinal cord, and intraspinal cystic lesions at the sacrum thought to be unrelated. Indeterminate for thoracic transverse myelitis versus spinal vascular malformation. Query evidence of CNS infection. EXAM: MRI HEAD WITHOUT AND WITH CONTRAST MRI CERVICAL SPINE WITHOUT AND WITH CONTRAST TECHNIQUE: Multiplanar, multiecho pulse sequences of the brain and surrounding structures, and cervical spine, to include the craniocervical junction and cervicothoracic junction, were obtained without and with intravenous contrast. CONTRAST:  55m GADAVIST GADOBUTROL 1 MMOL/ML IV SOLN COMPARISON:  Thoracic and lumbar MRI without and with contrast yesterday. FINDINGS: MRI HEAD FINDINGS Brain: Bilateral choroid plexus cysts, normal variant. Normal cerebral volume. No restricted diffusion to suggest acute infarction. No midline shift, mass effect, evidence of mass lesion, ventriculomegaly, extra-axial collection or acute intracranial hemorrhage. Cervicomedullary junction and pituitary are within normal limits.  There is no ventriculomegaly or layering intraventricular debris. There is questionable incomplete suppression of CSF in some of the bilateral cerebral sulci on FLAIR imaging (series 8, image 15, 20). However, this is not apparent on sagittal FLAIR imaging (series 12). This is not associated with abnormal pachymeningeal or leptomeningeal enhancement. And underlying cerebral cortex signal appears to remain normal. There is scattered nonspecific mostly subcortical white matter small foci of T2 and FLAIR hyperintensity in both hemispheres, moderate for age. No abnormal parenchymal enhancement. No chronic cerebral blood products. Deep gray matter nuclei remain within normal limits, but there is possible abnormal  T2 hyperintensity in the periaqueductal gray matter (series 7, image 12). Mamillary bodies appear normal. Possible trace abnormal FLAIR signal in the dorsal pons on series 8, image 8, but elsewhere brainstem signal appears normal. Cerebellum within normal limits. Vascular: Major intracranial vascular flow voids are preserved. The major dural venous sinuses are enhancing and appear to be patent. Skull and upper cervical spine: Cervical spine detailed below. Visualized bone marrow signal is within normal limits. Sinuses/Orbits: Negative orbits. Trace paranasal sinus mucosal thickening. Other: Grossly normal visible internal auditory structures. Mastoids are clear. Scalp and face soft tissues appear negative. MRI CERVICAL SPINE FINDINGS Alignment: Relatively preserved cervical lordosis. Vertebrae: No marrow edema or evidence of acute osseous abnormality. Some degenerative endplate marrow signal changes but background bone marrow signal is normal. Cord: Unfortunate motion artifact on sagittal T2 and stir sequences. And similar motion on axial T2 and GRE. No definite abnormal signal in the substance of the cervical spinal cord. No cord expansion. 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 Furthermore, the sagittal cervical spine postcontrast images raise the possibility of cerebellar folia leptomeningeal enhancement also (same image). No other spinal dural thickening. Posterior Fossa, vertebral arteries, paraspinal tissues: No definite signal abnormality within the substance of the cervicomedullary junction. Intracranial details above. Preserved major vascular flow voids in the neck. Negative visible neck soft tissues. Disc levels: Ordinary cervical spine disc and endplate degeneration from C4-C5  through C6-C7 without significant spinal stenosis. There is mild to moderate bilateral C6 neural foraminal stenosis. IMPRESSION: 1. 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 Although the dedicated brain postcontrast imaging does not suggest widespread intracranial leptomeningeal enhancement. 2. The substance of the cervical spinal cord appears to remain normal, although detail is suboptimal due to motion on sagittal and axial images. 3. Questionable incomplete CSF suppression along the convexities on FLAIR imaging. Moderate for age nonspecific mostly subcortical white matter T2 hyperintensities, and questionable abnormal signal in the periaqueductal gray matter. No abnormal brain diffusion or parenchymal enhancement. Optic nerves appear to remain normal. 4. 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 Also a spinal meningeal infection or metastatic disease is difficult to exclude. Other diagnoses considered but felt unlikely include Wernicke Encephalopathy. CSF analysis would be valuable in this case. Electronically Signed: By: Genevie Ann M.D. On: 03/10/2020 09:56   MR CERVICAL SPINE W WO CONTRAST  Addendum Date: 03/10/2020   ADDENDUM REPORT: 03/10/2020 10:20 ADDENDUM: Study discussed by telephone with Dr. Cheral Marker on 03/10/2020 at 10:13, and also reviewed with Dr. Corrie Mckusick NIR. We discussed that the FLAIR subarachnoid signal around the brain and the possible leptomeningeal enhancement along the cervical spinal cord and  cervicomedullary junction could be artifactual (3 Tesla imaging). Furthermore, we reverse viewed the thoracic spine MRI from yesterday, and that study demonstrates abnormal INTRINSIC T1 hyperintensity in the subarachnoid space along the abnormal segment of spinal cord more so than abnormal enhancement. Furthermore, the axial GRE appearance  to me most resembles blood products around the spinal cord. So at this time noninvasive CTA Thoracic Spine (using chest CTA aortic timing protocol) will be pursued prior to conventional spinal angiogram. CSF analysis could still be valuable although I am now more suspicious of a vascular/hemorrhage etiology than an inflammatory or infectious process. Electronically Signed   By: Genevie Ann M.D.   On: 03/10/2020 10:20   Result Date: 03/10/2020 CLINICAL DATA:  58 year old female with back pain, difficulty walking. Abnormal signal and enhancement In the upper thoracic spinal cord, and intraspinal cystic lesions at the sacrum thought to be unrelated. Indeterminate for thoracic transverse myelitis versus spinal vascular malformation. Query evidence of CNS infection. EXAM: MRI HEAD WITHOUT AND WITH CONTRAST MRI CERVICAL SPINE WITHOUT AND WITH CONTRAST TECHNIQUE: Multiplanar, multiecho pulse sequences of the brain and surrounding structures, and cervical spine, to include the craniocervical junction and cervicothoracic junction, were obtained without and with intravenous contrast. CONTRAST:  72m GADAVIST GADOBUTROL 1 MMOL/ML IV SOLN COMPARISON:  Thoracic and lumbar MRI without and with contrast yesterday. FINDINGS: MRI HEAD FINDINGS Brain: Bilateral choroid plexus cysts, normal variant. Normal cerebral volume. No restricted diffusion to suggest acute infarction. No midline shift, mass effect, evidence of mass lesion, ventriculomegaly, extra-axial collection or acute intracranial hemorrhage. Cervicomedullary junction and pituitary are within normal limits. There is no ventriculomegaly or layering intraventricular debris. There is questionable incomplete suppression of CSF in some of the bilateral cerebral sulci on FLAIR imaging (series 8, image 15, 20). However, this is not apparent on sagittal FLAIR imaging (series 12). This is not associated with abnormal pachymeningeal or leptomeningeal enhancement. And underlying  cerebral cortex signal appears to remain normal. There is scattered nonspecific mostly subcortical white matter small foci of T2 and FLAIR hyperintensity in both hemispheres, moderate for age. No abnormal parenchymal enhancement. No chronic cerebral blood products. Deep gray matter nuclei remain within normal limits, but there is possible abnormal T2 hyperintensity in the periaqueductal gray matter (series 7, image 12). Mamillary bodies appear normal. Possible trace abnormal FLAIR signal in the dorsal pons on series 8, image 8, but elsewhere brainstem signal appears normal. Cerebellum within normal limits. Vascular: Major intracranial vascular flow voids are preserved. The major dural venous sinuses are enhancing and appear to be patent. Skull and upper cervical spine: Cervical spine detailed below. Visualized bone marrow signal is within normal limits. Sinuses/Orbits: Negative orbits. Trace paranasal sinus mucosal thickening. Other: Grossly normal visible internal auditory structures. Mastoids are clear. Scalp and face soft tissues appear negative. MRI CERVICAL SPINE FINDINGS Alignment: Relatively preserved cervical lordosis. Vertebrae: No marrow edema or evidence of acute osseous abnormality. Some degenerative endplate marrow signal changes but background bone marrow signal is normal. Cord: Unfortunate motion artifact on sagittal T2 and stir sequences. And similar motion on axial T2 and GRE. No definite abnormal signal in the substance of the cervical spinal cord. No cord expansion. 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 Furthermore, the sagittal cervical spine postcontrast images raise the possibility of cerebellar folia leptomeningeal enhancement also (same image). No other spinal dural  thickening. Posterior Fossa, vertebral arteries, paraspinal tissues: No definite signal abnormality within the substance of the cervicomedullary junction. Intracranial details above. Preserved major vascular flow voids in the neck. Negative visible neck soft tissues. Disc levels: Ordinary cervical spine disc and endplate degeneration from C4-C5 through C6-C7 without significant spinal stenosis. There is mild to moderate bilateral C6 neural foraminal stenosis. IMPRESSION: 1. 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 Although the dedicated brain postcontrast imaging does not suggest widespread intracranial leptomeningeal enhancement. 2. The substance of the cervical spinal cord appears to remain normal, although detail is suboptimal due to motion on sagittal and axial images. 3. Questionable incomplete CSF suppression along the convexities on FLAIR imaging. Moderate for age nonspecific mostly subcortical white matter T2 hyperintensities, and questionable abnormal signal in the periaqueductal gray matter. No abnormal brain diffusion or parenchymal enhancement. Optic nerves appear to remain normal. 4. 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 Also a spinal meningeal infection or metastatic disease is difficult to exclude. Other diagnoses considered but felt unlikely include Wernicke Encephalopathy. CSF analysis would be valuable in this case. Electronically Signed: By: Genevie Ann M.D. On: 03/10/2020 09:56   MR THORACIC SPINE W WO CONTRAST  Result Date: 03/09/2020 CLINICAL DATA:  Back pain several days. Additionally upper back pain now lower back pain. No fever. Negative for leukocytosis. No weakness identified. EXAM: MRI THORACIC AND LUMBAR SPINE WITHOUT AND WITH CONTRAST TECHNIQUE: Multiplanar and multiecho pulse sequences of the thoracic and lumbar spine  were obtained without and with intravenous contrast. CONTRAST:  6.68m GADAVIST GADOBUTROL 1 MMOL/ML IV SOLN COMPARISON:  CT abdomen pelvis 03/09/2020 FINDINGS: MRI THORACIC SPINE FINDINGS Alignment:  Normal Vertebrae: Negative for fracture. No evidence of discitis or osteomyelitis. No abnormal enhancement in the vertebra. Cord: Cord hyperintensity centrally at the T4 and T5 level without expansion or abnormal enhancement. There is fatty infiltration in the dura on the right at T3-4 and on the right at T6 and T7. This is relatively mild and not causing cord compression. There are prominent areas of decreased signal within the subarachnoid space posterior to the cord in the thoracic spine. This raises the possibility of enlarged vessels from a vascular malformation however they do not seem to enhance postcontrast administration. Paraspinal and other soft tissues: No paraspinous mass or fluid collection Disc levels: Mild disc degeneration throughout the thoracic spine with mild disc degeneration but no disc protrusion. MRI LUMBAR SPINE FINDINGS Segmentation:  Normal Alignment:  Normal Vertebrae:  Normal.  No evidence of discitis osteomyelitis. Conus medullaris: Extends to the L1-2 level and appears normal. Cauda equina appears normal in the lumbar spine. Paraspinal and other soft tissues: Negative for paraspinous mass, adenopathy, or fluid collection Disc levels: Mild lumbar disc degeneration without disc protrusion or stenosis. Complex cystic mass in the sacral canal at the S1 and S2 level. Multilocular cysts are present which show mild increased signal on T1 and decreased signal on T2 but no enhancement postcontrast administration. This fills the canal without bony destruction. IMPRESSION: 1. Central cord hyperintensity at the T4-T5 level without mass lesion. Possible transverse myelitis. Also possible is a dural vascular malformation. There are possible dilated vessels around the spinal cord in the subarachnoid  space in the thoracic spine however these could be CSF flow related artifact. 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 2. Complex cystic mass  in the sacral canal at S1 and S2 without abnormal enhancement. Favor a benign process such as epidermoid cyst. 3. No MRI evidence of infection in the thoracic or lumbar spine. 4. These results were called by telephone at the time of interpretation on 03/09/2020 at 3:37 pm to provider [facility] , who verbally acknowledged these results. Electronically Signed   By: Franchot Gallo M.D.   On: 03/09/2020 15:40   MR Lumbar Spine W Wo Contrast  Result Date: 03/09/2020 CLINICAL DATA:  Back pain several days. Additionally upper back pain now lower back pain. No fever. Negative for leukocytosis. No weakness identified. EXAM: MRI THORACIC AND LUMBAR SPINE WITHOUT AND WITH CONTRAST TECHNIQUE: Multiplanar and multiecho pulse sequences of the thoracic and lumbar spine were obtained without and with intravenous contrast. CONTRAST:  6.53m GADAVIST GADOBUTROL 1 MMOL/ML IV SOLN COMPARISON:  CT abdomen pelvis 03/09/2020 FINDINGS: MRI THORACIC SPINE FINDINGS Alignment:  Normal Vertebrae: Negative for fracture. No evidence of discitis or osteomyelitis. No abnormal enhancement in the vertebra. Cord: Cord hyperintensity centrally at the T4 and T5 level without expansion or abnormal enhancement. There is fatty infiltration in the dura on the right at T3-4 and on the right at T6 and T7. This is relatively mild and not causing cord compression. There are prominent areas of decreased signal within the subarachnoid space posterior to the cord in the thoracic spine. This raises the possibility of enlarged vessels from a vascular malformation however they do not seem to enhance postcontrast administration. Paraspinal and other soft tissues: No paraspinous mass or fluid collection Disc levels: Mild disc degeneration throughout the thoracic  spine with mild disc degeneration but no disc protrusion. MRI LUMBAR SPINE FINDINGS Segmentation:  Normal Alignment:  Normal Vertebrae:  Normal.  No evidence of discitis osteomyelitis. Conus medullaris: Extends to the L1-2 level and appears normal. Cauda equina appears normal in the lumbar spine. Paraspinal and other soft tissues: Negative for paraspinous mass, adenopathy, or fluid collection Disc levels: Mild lumbar disc degeneration without disc protrusion or stenosis. Complex cystic mass in the sacral canal at the S1 and S2 level. Multilocular cysts are present which show mild increased signal on T1 and decreased signal on T2 but no enhancement postcontrast administration. This fills the canal without bony destruction. IMPRESSION: 1. Central cord hyperintensity at the T4-T5 level without mass lesion. Possible transverse myelitis. Also possible is a dural vascular malformation. There are possible dilated vessels around the spinal cord in the subarachnoid space in the thoracic spine however these could be CSF flow related artifact. 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 2. Complex cystic mass in the sacral canal at S1 and S2 without abnormal enhancement. Favor a benign process such as epidermoid cyst. 3. No MRI evidence of infection in the thoracic or lumbar spine. 4. These results were called by telephone at the time of interpretation on 03/09/2020 at 3:37 pm [facility] who verbally acknowledged these results. Electronically Signed   By: CFranchot GalloM.D.   On: 03/09/2020 15:40   CT Abdomen Pelvis W Contrast  Result Date: 03/09/2020 CLINICAL DATA:  Back pain for several days EXAM: CT ABDOMEN AND PELVIS WITH CONTRAST TECHNIQUE: Multidetector CT imaging of the abdomen and pelvis was performed using the standard protocol following bolus administration of intravenous contrast. CONTRAST:  1076mOMNIPAQUE IOHEXOL 300 MG/ML  SOLN COMPARISON:   None. FINDINGS: Lower chest: No acute abnormality. Hepatobiliary: No focal liver abnormality is  seen. No gallstones, gallbladder wall thickening, or biliary dilatation. Pancreas: Unremarkable. No pancreatic ductal dilatation or surrounding inflammatory changes. Spleen: Normal in size without focal abnormality. Adrenals/Urinary Tract: Adrenal glands are unremarkable. Kidneys are normal, without renal calculi, focal lesion, or hydronephrosis. Bladder is unremarkable. Stomach/Bowel: Stomach is within normal limits. Appendix appears normal. No evidence of bowel wall thickening, distention, or inflammatory changes. Vascular/Lymphatic: No significant vascular findings are present. No enlarged abdominal or pelvic lymph nodes. Reproductive: Uterus and bilateral adnexa are unremarkable. Other: No abdominal wall hernia or abnormality. No abdominopelvic ascites. Musculoskeletal: No acute or significant osseous findings. IMPRESSION: 1. No acute abdominal or pelvic pathology. Electronically Signed   By: Kathreen Devoid   On: 03/09/2020 12:53    Labs:  CBC: Recent Labs    03/05/20 1906 03/06/20 0936 03/09/20 1110  WBC 6.6 8.3 10.0  HGB 14.3 14.3 14.7  HCT 42.4 41.2 41.9  PLT 276 295 338    COAGS: Recent Labs    03/09/20 1110  INR 1.0    BMP: Recent Labs    03/05/20 1906 03/06/20 0936 03/09/20 1110  NA 135 138 140  K 3.0* 3.5 3.7  CL 101 106 107  CO2 22 20* 20*  GLUCOSE 170* 133* 106*  BUN 10 7 11   CALCIUM 9.0 8.9 9.5  CREATININE 0.77 0.80 0.84  GFRNONAA >60 >60 >60    LIVER FUNCTION TESTS: Recent Labs    03/05/20 1906 03/06/20 0936 03/09/20 1110  BILITOT 1.0 1.1 1.9*  AST 24 21 22   ALT 15 15 20   ALKPHOS 71 67 67  PROT 6.9 6.3* 7.5  ALBUMIN 4.1 3.7 4.4    TUMOR MARKERS: No results for input(s): AFPTM, CEA, CA199, CHROMGRNA in the last 8760 hours.  Assessment and Plan:  Ms Neyra is 59 yo female with acute head-ache and back pain, with thoracic MRI demonstrating T4-T5  signal abnormality.   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   At this time, given that we do not have 100% confidence that the signal in the CSF represents vasculature, we have suggested CTA of the region, to be protocol with pre and post contrast.  This could avoid a 2-3 hour diagnostic angiogram with large contrast load if negative for evidence of abnormal vessels.   If positive, spinal angio would be reasonable to consider.  A complete informed consent for angio has not yet been completed.    I discussed our decision tree and diagnostic plan with the patient and her husband at length, and they understand.    Thank you for this interesting consult.  I greatly enjoyed meeting Stormey Wilborn and look forward to participating in their care.  A copy of this report was sent to the requesting provider on this date.  Electronically Signed: Corrie Mckusick, DO 03/10/2020, 11:05 AM   I spent a total of 55 Miinutes    in face to face in clinical consultation, greater than 50% of which was counseling/coordinating care for cord signal abnormality at T4-T5, possible AV fistula, possible angiogram.

## 2020-03-10 NOTE — Progress Notes (Signed)
PROGRESS NOTE    Taylor Sharp  [id number] DOB: [birth year] DOA: 03/09/2020 PCP: Lawerance Cruel, MD  Brief Narrative: 59 year old female with no significant past medical history presented to the ER for the second time with increasing back pain on 12/30. -Over a week ago ate some chicken following which developed diarrhea, had some nausea and mild vomiting at the time, felt to be viral, remembers diarrhea as being pretty profuse, which subsequently resolved, following this developed mid back pain which progressively worsened, she was seen in the ER on 12/27 this is felt to be musculoskeletal and discharged home on pain meds. 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  In addition there was a complex cystic mass at the sacral canal at S1-S2.   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 Earleen Newport today, recommended CTA with contrast instead of spinal angiogram -Neurology Dr. Cheral Marker following, on empiric vancomycin and ceftriaxone -Continue Dilaudid as needed, add Robaxin, Zofran -Physical therapy  DVT prophylaxis: SCDs Code Status: Full code  Family Communication: Spouse at bedside Disposition Plan:  Status is: Inpatient  Remains inpatient appropriate because:Inpatient level of care appropriate due to severity of illness   Dispo: The patient is from: Home              Anticipated d/c is to: Home              Anticipated d/c date is: unknown              Patient currently is not medically stable to d/c.  Consultants:   Neurology  NEURO IR   Procedures:   Antimicrobials:    Subjective: -Continues to have severe mid  back pain, remains uncomfortable in bed Objective: Vitals:   03/09/20 2115 03/09/20 2340 03/10/20 0350 03/10/20 1223  BP: 124/66 (!) 147/49 114/66 127/62  Pulse: 83 92 83 68  Resp: 18 (!) 22 18 18   Temp: 98 F (36.7 C) 97.8 F (36.6 C) 98.3 F (36.8 C) 98.4 F (36.9 C)  TempSrc: Oral Oral Oral Oral  SpO2: 100% 100% 96% 100%  Weight:      Height:        Intake/Output Summary (Last 24 hours) at 03/10/2020 1400 Last data filed at 03/10/2020 0900 Gross per 24 hour  Intake 3141.01 ml  Output --  Net 3141.01 ml   Filed Weights   03/09/20 0741  Weight: 61.2 kg    Examination:  General exam: Pleasant female, laying in bed, uncomfortable appearing, AAOx3 Respiratory system: Clear Cardiovascular system: S1 & S2 heard, RRR. Gastrointestinal system: Soft nontender Central nervous system: Cranial nerves intact, motor upper and lower extremity with 4+ strength, sensations intact, plantars withdrawal Extremities: No edema Skin: No rashes on exposed skin Psychiatry:  Mood & affect appropriate.     Data Reviewed:   CBC: Recent Labs  Lab 03/05/20 1906 03/06/20 0936 03/09/20 1110  WBC 6.6 8.3 10.0  NEUTROABS  --   --  6.6  HGB 14.3 14.3 14.7  HCT 42.4 41.2 41.9  MCV 89.6 88.2 88.2  PLT 276 295 [id number]   Basic Metabolic Panel: Recent Labs  Lab 03/05/20 1906 03/06/20 0936 03/06/20 1445 03/09/20 1110  NA 135 138  --  140  K 3.0* 3.5  --  3.7  CL 101 106  --  107  CO2 22 20*  --  20*  GLUCOSE 170* 133*  --  106*  BUN 10 7  --  11  CREATININE 0.77 0.80  --  0.84  CALCIUM 9.0 8.9  --  9.5  MG  --   --  2.0  --    GFR: Estimated Creatinine Clearance: 69.7 mL/min (by C-G formula based on SCr of 0.84 mg/dL). Liver Function Tests: Recent Labs  Lab 03/05/20 1906 03/06/20 0936 03/09/20 1110  AST 24 21 22   ALT 15 15 20   ALKPHOS 71 67 67  BILITOT 1.0 1.1 1.9*  PROT 6.9 6.3* 7.5  ALBUMIN 4.1 3.7 4.4   Recent Labs  Lab 03/05/20 1906 03/09/20 1110  LIPASE 34 26    No results for input(s): AMMONIA in the last 168 hours. Coagulation Profile: Recent Labs  Lab 03/09/20 1110  INR 1.0   Cardiac Enzymes: Recent Labs  Lab 03/09/20 1110  CKTOTAL 136   BNP (last 3 results) No results for input(s): PROBNP in the last 8760 hours. HbA1C: No results for input(s): HGBA1C in the last 72 hours. CBG: No results for input(s): GLUCAP in the last 168 hours. Lipid Profile: No results for input(s): CHOL, HDL, LDLCALC, TRIG, CHOLHDL, LDLDIRECT in the last 72 hours. Thyroid Function Tests: No results for input(s): TSH, T4TOTAL, FREET4, T3FREE, THYROIDAB in the last 72 hours. Anemia Panel: No results for input(s): VITAMINB12, FOLATE, FERRITIN, TIBC, IRON, RETICCTPCT in the last 72 hours. Urine analysis:    Component Value Date/Time   COLORURINE YELLOW 03/09/2020 1320   APPEARANCEUR CLEAR 03/09/2020 1320   LABSPEC >1.046 (H) 03/09/2020 1320   PHURINE 5.0 03/09/2020 1320   GLUCOSEU NEGATIVE 03/09/2020 1320   HGBUR NEGATIVE 03/09/2020 1320   BILIRUBINUR NEGATIVE 03/09/2020 1320   KETONESUR 5 (A) 03/09/2020 1320   PROTEINUR NEGATIVE 03/09/2020 1320   NITRITE NEGATIVE 03/09/2020 1320   LEUKOCYTESUR SMALL (A) 03/09/2020 1320   Sepsis Labs: @LABRCNTIP (procalcitonin:4,lacticidven:4)  ) Recent Results (from the past 240 hour(s))  Resp Panel by RT-PCR (Flu A&B, Covid) Nasopharyngeal Swab     Status: None   Collection Time: 03/05/20  7:06 PM   Specimen: Nasopharyngeal Swab; Nasopharyngeal(NP) swabs in vial transport medium  Result Value Ref Range Status   SARS Coronavirus 2 by RT PCR NEGATIVE NEGATIVE Final    Comment: (NOTE) SARS-CoV-2 target nucleic acids are NOT DETECTED.  The SARS-CoV-2 RNA is generally detectable in upper respiratory specimens during the acute phase of infection. The lowest concentration of SARS-CoV-2 viral copies this assay can detect is 138 copies/mL. A negative result does not preclude SARS-Cov-2 infection and should not be  used as the sole basis for treatment or other patient management decisions. A negative result may occur with  improper specimen collection/handling, submission of specimen other than nasopharyngeal swab, presence of viral mutation(s) within the areas targeted by this assay, and inadequate number of viral copies(<138 copies/mL). A negative result must be combined with clinical observations, patient history, and epidemiological information. The expected result is Negative.  Fact Sheet for Patients:  EntrepreneurPulse.com.au  Fact Sheet for Healthcare Providers:  IncredibleEmployment.be  This test is no t yet approved or cleared by the [place] FDA and  has been authorized for detection and/or diagnosis of SARS-CoV-2 by FDA under an Emergency Use Authorization (EUA). This EUA will remain  in effect (meaning this test can be used) for the duration of  the COVID-19 declaration under Section 564(b)(1) of the Act, 21 U.S.C.section 360bbb-3(b)(1), unless the authorization is terminated  or revoked sooner.       Influenza A by PCR NEGATIVE NEGATIVE Final   Influenza B by PCR NEGATIVE NEGATIVE Final    Comment: (NOTE) The Xpert Xpress SARS-CoV-2/FLU/RSV plus assay is intended as an aid in the diagnosis of influenza from Nasopharyngeal swab specimens and should not be used as a sole basis for treatment. Nasal washings and aspirates are unacceptable for Xpert Xpress SARS-CoV-2/FLU/RSV testing.  Fact Sheet for Patients: EntrepreneurPulse.com.au  Fact Sheet for Healthcare Providers: IncredibleEmployment.be  This test is not yet approved or cleared by the [place] FDA and has been authorized for detection and/or diagnosis of SARS-CoV-2 by FDA under an Emergency Use Authorization (EUA). This EUA will remain in effect (meaning this test can be used) for the duration of the COVID-19 declaration under Section  564(b)(1) of the Act, 21 U.S.C. section 360bbb-3(b)(1), unless the authorization is terminated or revoked.  Performed at [facility], [address] [place], [place] [address]   Culture, blood (routine x 2)     Status: None (Preliminary result)   Collection Time: 03/09/20 12:01 PM   Specimen: Left Antecubital; Blood  Result Value Ref Range Status   Specimen Description   Final    LEFT ANTECUBITAL Performed at [facility] [address], [place], [place] [address]    Special Requests   Final    BOTTLES DRAWN AEROBIC ONLY Blood Culture adequate volume Performed at [facility] [address] [place], [place] [address]    Culture   Final    NO GROWTH < 24 HOURS Performed at [facility] [address] [place], [place] [address]    Report Status PENDING  Incomplete  Culture, blood (routine x 2)     Status: None (Preliminary result)   Collection Time: 03/09/20 12:06 PM   Specimen: Right Antecubital; Blood  Result Value Ref Range Status   Specimen Description   Final    RIGHT ANTECUBITAL Performed at [facility] [address] [place], [place] [address]    Special Requests   Final    BOTTLES DRAWN AEROBIC AND ANAEROBIC Blood Culture adequate volume Performed at [facility] [address] [place],  [address]    Culture   Final    NO GROWTH < 24 HOURS Performed at [facility] [address], [place],  [address]    Report Status PENDING  Incomplete  SARS CORONAVIRUS 2 (TAT 6-24 HRS) Nasopharyngeal Nasopharyngeal Swab     Status: None   Collection Time: 03/09/20  5:47 PM   Specimen: Nasopharyngeal Swab  Result Value Ref Range Status   SARS Coronavirus 2 NEGATIVE NEGATIVE Final    Comment: (NOTE) SARS-CoV-2 target nucleic acids are NOT DETECTED.  The SARS-CoV-2 RNA is generally detectable in upper and lower respiratory specimens during the acute phase  of infection. Negative results do not preclude SARS-CoV-2 infection, do not rule out co-infections with other pathogens, and should not be used as the sole basis for treatment or other patient management decisions. Negative results must be combined with clinical observations, patient history, and epidemiological information. The expected result is Negative.  Fact Sheet for Patients: SugarRoll.be  Fact Sheet for Healthcare Providers: https://www.woods-mathews.com/  This test is not yet approved or cleared by the [place] FDA and  has been authorized for detection and/or  diagnosis of SARS-CoV-2 by FDA under an Emergency Use Authorization (EUA). This EUA will remain  in effect (meaning this test can be used) for the duration of the COVID-19 declaration under Se ction 564(b)(1) of the Act, 21 U.S.C. section 360bbb-3(b)(1), unless the authorization is terminated or revoked sooner.  Performed at [facility], [place] [address], [place],  [address]          Radiology Studies: CT ANGIO CHEST PE W OR WO CONTRAST  Result Date: 03/10/2020 CLINICAL DATA:  59 year old female with abnormal thoracic spinal cord and subarachnoid space on MRI. 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 EXAM: CT ANGIOGRAPHY CHEST WITH CONTRAST TECHNIQUE: Multidetector CT imaging of the chest was performed using the standard protocol during bolus administration of intravenous contrast. Multiplanar CT image reconstructions and MIPs were obtained to evaluate the vascular anatomy. CONTRAST:  137mL OMNIPAQUE IOHEXOL 350 MG/ML SOLN COMPARISON:  MRI head and cervical spine earlier today, thoracic and lumbar spine yesterday. CT Abdomen and Pelvis yesterday. FINDINGS: Thoracic spine: Stable thoracic vertebral height and alignment. There is mild dextroconvex scoliosis. No acute osseous abnormality identified. On precontrast images there is subtle increased density  in the dorsal spinal canal at the T4-T5 level (series 14, image 36) which on MRI corresponds to an area of intermediate to increased T1 signal, dark T2 and STIR signal. Notably, there is no evidence of macroscopic fat density within the ventral spinal canal nearby at the T5-T6 level. Following contrast there does appear to be mild enhancement of the dorsal T5 region (series 10, image 61) which is more apparent on sagittal MIP (series 12, image 61). And furthermore a small nodular or curvilinear enhancing structure located just deep to the right T5 pedicle can be identified on this exam (series 6, image 49 and series 12, image 59) as well as the postcontrast MRI (series 32, image 9 of that exam). However, there do not appear to be abnormally enlarged thoracic paraspinal vessels, and no abnormally numerous or enlarged vessels entering or leaving the spinal canal can be identified. Cardiovascular: Negative thoracic aorta. Central pulmonary arteries also appear to be enhancing and patent. No cardiomegaly or pericardial effusion. No calcified coronary artery atherosclerosis is evident. Mediastinum/Nodes: Negative.  No lymphadenopathy. Lungs/Pleura: Major airways are patent. Lungs are clear aside from minor dependent atelectasis. No pleural effusion. Upper Abdomen: By carious excretion of contrast to the gallbladder since the CT Abdomen and Pelvis yesterday. Otherwise stable, negative visible upper abdominal viscera. Musculoskeletal: No acute osseous abnormality identified. Review of the MIP images confirms the above findings. IMPRESSION: 1. Subtle increased density in the dorsal thoracic spinal canal at T4-T5 which does mildly enhance following contrast administration, along with a small nodular or curvilinear focus of enhancement just inside the right T5 pedicle, which is also identified on the postcontrast MRI yesterday. No clustered or abnormal paraspinal vessels are identified. No CT evidence of intraspinal lipoma.  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 2. Otherwise negative CTA appearance of the chest. Electronically Signed   By: Genevie Ann M.D.   On: 03/10/2020 12:50   MR BRAIN W WO CONTRAST  Addendum Date: 03/10/2020   ADDENDUM REPORT: 03/10/2020 10:20 ADDENDUM: Study discussed by telephone with Dr. Cheral Marker on 03/10/2020 at 10:13, and also reviewed with Dr. Corrie Mckusick NIR. We discussed that the FLAIR subarachnoid signal around the brain and the possible leptomeningeal enhancement along the  cervical spinal cord and cervicomedullary junction could be artifactual (3 Tesla imaging). Furthermore, we reverse viewed the thoracic spine MRI from yesterday, and that study demonstrates abnormal INTRINSIC T1 hyperintensity in the subarachnoid space along the abnormal segment of spinal cord more so than abnormal enhancement. Furthermore, the axial GRE appearance to me most resembles blood products around the spinal cord. So at this time noninvasive CTA Thoracic Spine (using chest CTA aortic timing protocol) will be pursued prior to conventional spinal angiogram. CSF analysis could still be valuable although I am now more suspicious of a vascular/hemorrhage etiology than an inflammatory or infectious process. Electronically Signed   By: Genevie Ann M.D.   On: 03/10/2020 10:20   Result Date: 03/10/2020 CLINICAL DATA:  59 year old female with back pain, difficulty walking. Abnormal signal and enhancement In the upper thoracic spinal cord, and intraspinal cystic lesions at the sacrum thought to be unrelated. Indeterminate for thoracic transverse myelitis versus spinal vascular malformation. Query evidence of CNS infection. EXAM: MRI HEAD WITHOUT AND WITH CONTRAST MRI CERVICAL SPINE WITHOUT AND WITH CONTRAST TECHNIQUE: Multiplanar, multiecho pulse sequences of the brain and surrounding structures, and cervical spine, to  include the craniocervical junction and cervicothoracic junction, were obtained without and with intravenous contrast. CONTRAST:  47mL GADAVIST GADOBUTROL 1 MMOL/ML IV SOLN COMPARISON:  Thoracic and lumbar MRI without and with contrast yesterday. FINDINGS: MRI HEAD FINDINGS Brain: Bilateral choroid plexus cysts, normal variant. Normal cerebral volume. No restricted diffusion to suggest acute infarction. No midline shift, mass effect, evidence of mass lesion, ventriculomegaly, extra-axial collection or acute intracranial hemorrhage. Cervicomedullary junction and pituitary are within normal limits. There is no ventriculomegaly or layering intraventricular debris. There is questionable incomplete suppression of CSF in some of the bilateral cerebral sulci on FLAIR imaging (series 8, image 15, 20). However, this is not apparent on sagittal FLAIR imaging (series 12). This is not associated with abnormal pachymeningeal or leptomeningeal enhancement. And underlying cerebral cortex signal appears to remain normal. There is scattered nonspecific mostly subcortical white matter small foci of T2 and FLAIR hyperintensity in both hemispheres, moderate for age. No abnormal parenchymal enhancement. No chronic cerebral blood products. Deep gray matter nuclei remain within normal limits, but there is possible abnormal T2 hyperintensity in the periaqueductal gray matter (series 7, image 12). Mamillary bodies appear normal. Possible trace abnormal FLAIR signal in the dorsal pons on series 8, image 8, but elsewhere brainstem signal appears normal. Cerebellum within normal limits. Vascular: Major intracranial vascular flow voids are preserved. The major dural venous sinuses are enhancing and appear to be patent. Skull and upper cervical spine: Cervical spine detailed below. Visualized bone marrow signal is within normal limits. Sinuses/Orbits: Negative orbits. Trace paranasal sinus mucosal thickening. Other: Grossly normal visible  internal auditory structures. Mastoids are clear. Scalp and face soft tissues appear negative. MRI CERVICAL SPINE FINDINGS Alignment: Relatively preserved cervical lordosis. Vertebrae: No marrow edema or evidence of acute osseous abnormality. Some degenerative endplate marrow signal changes but background bone marrow signal is normal. Cord: Unfortunate motion artifact on sagittal T2 and stir sequences. And similar motion on axial T2 and GRE. No definite abnormal signal in the substance of the cervical spinal cord. No cord expansion. 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 Furthermore, the sagittal cervical spine postcontrast images raise the possibility of cerebellar folia leptomeningeal enhancement also (same image). No  other spinal dural thickening. Posterior Fossa, vertebral arteries, paraspinal tissues: No definite signal abnormality within the substance of the cervicomedullary junction. Intracranial details above. Preserved major vascular flow voids in the neck. Negative visible neck soft tissues. Disc levels: Ordinary cervical spine disc and endplate degeneration from C4-C5 through C6-C7 without significant spinal stenosis. There is mild to moderate bilateral C6 neural foraminal stenosis. IMPRESSION: 1. 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 Although the dedicated brain postcontrast imaging does not suggest widespread intracranial leptomeningeal enhancement. 2. The substance of the cervical spinal cord appears to remain normal, although detail is suboptimal due to motion on sagittal and axial images. 3. Questionable incomplete CSF suppression along the convexities on FLAIR  imaging. Moderate for age nonspecific mostly subcortical white matter T2 hyperintensities, and questionable abnormal signal in the periaqueductal gray matter. No abnormal brain diffusion or parenchymal enhancement. Optic nerves appear to remain normal. 4. 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 Also a spinal meningeal infection or metastatic disease is difficult to exclude. Other diagnoses considered but felt unlikely include Wernicke Encephalopathy. CSF analysis would be valuable in this case. Electronically Signed: By: Genevie Ann M.D. On: 03/10/2020 09:56   MR CERVICAL SPINE W WO CONTRAST  Addendum Date: 03/10/2020   ADDENDUM REPORT: 03/10/2020 10:20 ADDENDUM: Study discussed by telephone with Dr. Cheral Marker on 03/10/2020 at 10:13, and also reviewed with Dr. Corrie Mckusick NIR. We discussed that the FLAIR subarachnoid signal around the brain and the possible leptomeningeal enhancement along the cervical spinal cord and cervicomedullary junction could be artifactual (3 Tesla imaging). Furthermore, we reverse viewed the thoracic spine MRI from yesterday, and that study demonstrates abnormal INTRINSIC T1 hyperintensity in the subarachnoid space along the abnormal segment of spinal cord more so than abnormal enhancement. Furthermore, the axial GRE appearance to me most resembles blood products around the spinal cord. So at this time noninvasive CTA Thoracic Spine (using chest CTA aortic timing protocol) will be pursued prior to conventional spinal angiogram. CSF analysis could still be valuable although I am now more suspicious of a vascular/hemorrhage etiology than an inflammatory or infectious process. Electronically Signed   By: Genevie Ann M.D.   On: 03/10/2020 10:20   Result Date: 03/10/2020 CLINICAL DATA:  59 year old female with back pain, difficulty walking. Abnormal signal and enhancement In the upper thoracic spinal cord, and intraspinal cystic lesions at  the sacrum thought to be unrelated. Indeterminate for thoracic transverse myelitis versus spinal vascular malformation. Query evidence of CNS infection. EXAM: MRI HEAD WITHOUT AND WITH CONTRAST MRI CERVICAL SPINE WITHOUT AND WITH CONTRAST TECHNIQUE: Multiplanar, multiecho pulse sequences of the brain and surrounding structures, and cervical spine, to include the craniocervical junction and cervicothoracic junction, were obtained without and with intravenous contrast. CONTRAST:  41mL GADAVIST GADOBUTROL 1 MMOL/ML IV SOLN COMPARISON:  Thoracic and lumbar MRI without and with contrast yesterday. FINDINGS: MRI HEAD FINDINGS Brain: Bilateral choroid plexus cysts, normal variant. Normal cerebral volume. No restricted diffusion to suggest acute infarction. No midline shift, mass effect, evidence of mass lesion, ventriculomegaly, extra-axial collection or acute intracranial hemorrhage. Cervicomedullary junction and pituitary are within normal limits. There is no ventriculomegaly or layering intraventricular debris. There is questionable incomplete suppression of CSF in some of the bilateral cerebral sulci on FLAIR imaging (series 8, image 15, 20). However, this is not apparent on sagittal FLAIR imaging (series 12). This is not associated with abnormal pachymeningeal  or leptomeningeal enhancement. And underlying cerebral cortex signal appears to remain normal. There is scattered nonspecific mostly subcortical white matter small foci of T2 and FLAIR hyperintensity in both hemispheres, moderate for age. No abnormal parenchymal enhancement. No chronic cerebral blood products. Deep gray matter nuclei remain within normal limits, but there is possible abnormal T2 hyperintensity in the periaqueductal gray matter (series 7, image 12). Mamillary bodies appear normal. Possible trace abnormal FLAIR signal in the dorsal pons on series 8, image 8, but elsewhere brainstem signal appears normal. Cerebellum within normal limits. Vascular:  Major intracranial vascular flow voids are preserved. The major dural venous sinuses are enhancing and appear to be patent. Skull and upper cervical spine: Cervical spine detailed below. Visualized bone marrow signal is within normal limits. Sinuses/Orbits: Negative orbits. Trace paranasal sinus mucosal thickening. Other: Grossly normal visible internal auditory structures. Mastoids are clear. Scalp and face soft tissues appear negative. MRI CERVICAL SPINE FINDINGS Alignment: Relatively preserved cervical lordosis. Vertebrae: No marrow edema or evidence of acute osseous abnormality. Some degenerative endplate marrow signal changes but background bone marrow signal is normal. Cord: Unfortunate motion artifact on sagittal T2 and stir sequences. And similar motion on axial T2 and GRE. No definite abnormal signal in the substance of the cervical spinal cord. No cord expansion. 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 Furthermore, the sagittal cervical spine postcontrast images raise the possibility of cerebellar folia leptomeningeal enhancement also (same image). No other spinal dural thickening. Posterior Fossa, vertebral arteries, paraspinal tissues: No definite signal abnormality within the substance of the cervicomedullary junction. Intracranial details above. Preserved major vascular flow voids in the neck. Negative visible neck soft tissues. Disc levels: Ordinary cervical spine disc and endplate degeneration from C4-C5 through C6-C7 without significant spinal stenosis. There is mild to moderate bilateral C6 neural foraminal stenosis. IMPRESSION: 1. 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 Although the dedicated brain postcontrast imaging does not suggest widespread intracranial leptomeningeal enhancement. 2. The substance of the cervical spinal cord appears to remain normal, although detail is suboptimal due to motion on sagittal and axial images. 3. Questionable incomplete CSF suppression along the convexities on FLAIR imaging. Moderate for age nonspecific mostly subcortical white matter T2 hyperintensities, and questionable abnormal signal in the periaqueductal gray matter. No abnormal brain diffusion or parenchymal enhancement. Optic nerves appear to remain normal. 4. 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 Also a spinal meningeal infection or metastatic disease is difficult to exclude. Other diagnoses considered but felt unlikely include Wernicke Encephalopathy. CSF analysis would be valuable in this case. Electronically Signed: By: Odessa Fleming M.D. On: 03/10/2020 09:56   MR THORACIC SPINE W WO CONTRAST  Result Date: 03/09/2020 CLINICAL DATA:  Back pain several days. Additionally upper back pain now lower back pain. No fever. Negative for leukocytosis. No weakness identified. EXAM: MRI THORACIC AND LUMBAR SPINE WITHOUT AND WITH CONTRAST TECHNIQUE: Multiplanar and multiecho pulse sequences of the thoracic and lumbar spine were obtained without and with intravenous contrast. CONTRAST:  6.18mL GADAVIST GADOBUTROL 1 MMOL/ML IV SOLN COMPARISON:  CT abdomen pelvis 03/09/2020 FINDINGS: MRI THORACIC SPINE FINDINGS Alignment:  Normal Vertebrae: Negative for fracture. No evidence of discitis or osteomyelitis. No abnormal enhancement in the vertebra. Cord: Cord hyperintensity centrally  at the T4 and T5 level without expansion or abnormal enhancement. There is fatty infiltration in the dura on the right at T3-4 and on the right at T6 and T7. This is relatively mild and not causing cord compression. There are prominent areas of decreased  signal within the subarachnoid space posterior to the cord in the thoracic spine. This raises the possibility of enlarged vessels from a vascular malformation however they do not seem to enhance postcontrast administration. Paraspinal and other soft tissues: No paraspinous mass or fluid collection Disc levels: Mild disc degeneration throughout the thoracic spine with mild disc degeneration but no disc protrusion. MRI LUMBAR SPINE FINDINGS Segmentation:  Normal Alignment:  Normal Vertebrae:  Normal.  No evidence of discitis osteomyelitis. Conus medullaris: Extends to the L1-2 level and appears normal. Cauda equina appears normal in the lumbar spine. Paraspinal and other soft tissues: Negative for paraspinous mass, adenopathy, or fluid collection Disc levels: Mild lumbar disc degeneration without disc protrusion or stenosis. Complex cystic mass in the sacral canal at the S1 and S2 level. Multilocular cysts are present which show mild increased signal on T1 and decreased signal on T2 but no enhancement postcontrast administration. This fills the canal without bony destruction. IMPRESSION: 1. Central cord hyperintensity at the T4-T5 level without mass lesion. Possible transverse myelitis. Also possible is a dural vascular malformation. There are possible dilated vessels around the spinal cord in the subarachnoid space in the thoracic spine however these could be CSF flow related artifact. 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 2. Complex cystic mass in the sacral canal at S1 and S2 without abnormal enhancement. Favor a benign process such as epidermoid cyst. 3. No MRI evidence of infection in the thoracic or lumbar spine. 4. These results were called by telephone at the time of interpretation on 03/09/2020 at 3:37 pm to provider [facility] , who verbally acknowledged these results. Electronically Signed   By: Franchot Gallo M.D.   On: 03/09/2020 15:40   MR  Lumbar Spine W Wo Contrast  Result Date: 03/09/2020 CLINICAL DATA:  Back pain several days. Additionally upper back pain now lower back pain. No fever. Negative for leukocytosis. No weakness identified. EXAM: MRI THORACIC AND LUMBAR SPINE WITHOUT AND WITH CONTRAST TECHNIQUE: Multiplanar and multiecho pulse sequences of the thoracic and lumbar spine were obtained without and with intravenous contrast. CONTRAST:  6.89mL GADAVIST GADOBUTROL 1 MMOL/ML IV SOLN COMPARISON:  CT abdomen pelvis 03/09/2020 FINDINGS: MRI THORACIC SPINE FINDINGS Alignment:  Normal Vertebrae: Negative for fracture. No evidence of discitis or osteomyelitis. No abnormal enhancement in the vertebra. Cord: Cord hyperintensity centrally at the T4 and T5 level without expansion or abnormal enhancement. There is fatty infiltration in the dura on the right at T3-4 and on the right at T6 and T7. This is relatively mild and not causing cord compression. There are prominent areas of decreased signal within the subarachnoid space posterior to the cord in the thoracic spine. This raises the possibility of enlarged vessels from a vascular malformation however they do not seem to enhance postcontrast administration. Paraspinal and other soft tissues: No paraspinous mass or fluid collection Disc levels: Mild disc degeneration throughout the thoracic spine with mild disc degeneration but no disc protrusion. MRI LUMBAR SPINE FINDINGS Segmentation:  Normal Alignment:  Normal Vertebrae:  Normal.  No evidence of discitis osteomyelitis. Conus medullaris: Extends to the L1-2 level and appears normal. Cauda equina appears normal in the lumbar  spine. Paraspinal and other soft tissues: Negative for paraspinous mass, adenopathy, or fluid collection Disc levels: Mild lumbar disc degeneration without disc protrusion or stenosis. Complex cystic mass in the sacral canal at the S1 and S2 level. Multilocular cysts are present which show mild increased signal on T1 and  decreased signal on T2 but no enhancement postcontrast administration. This fills the canal without bony destruction. IMPRESSION: 1. Central cord hyperintensity at the T4-T5 level without mass lesion. Possible transverse myelitis. Also possible is a dural vascular malformation. There are possible dilated vessels around the spinal cord in the subarachnoid space in the thoracic spine however these could be CSF flow related artifact. 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 2. Complex cystic mass in the sacral canal at S1 and S2 without abnormal enhancement. Favor a benign process such as epidermoid cyst. 3. No MRI evidence of infection in the thoracic or lumbar spine. 4. These results were called by telephone at the time of interpretation on 03/09/2020 at 3:37 pm to provider [facility] , who verbally acknowledged these results. Electronically Signed   By: Franchot Gallo M.D.   On: 03/09/2020 15:40   CT Abdomen Pelvis W Contrast  Result Date: 03/09/2020 CLINICAL DATA:  Back pain for several days EXAM: CT ABDOMEN AND PELVIS WITH CONTRAST TECHNIQUE: Multidetector CT imaging of the abdomen and pelvis was performed using the standard protocol following bolus administration of intravenous contrast. CONTRAST:  114mL OMNIPAQUE IOHEXOL 300 MG/ML  SOLN COMPARISON:  None. FINDINGS: Lower chest: No acute abnormality. Hepatobiliary: No focal liver abnormality is seen. No gallstones, gallbladder wall thickening, or biliary dilatation. Pancreas: Unremarkable. No pancreatic ductal dilatation or surrounding inflammatory changes. Spleen: Normal in size without focal abnormality. Adrenals/Urinary Tract: Adrenal glands are unremarkable. Kidneys are normal, without renal calculi, focal lesion, or hydronephrosis. Bladder is unremarkable. Stomach/Bowel: Stomach is within normal limits. Appendix appears normal. No evidence of bowel wall thickening, distention, or inflammatory  changes. Vascular/Lymphatic: No significant vascular findings are present. No enlarged abdominal or pelvic lymph nodes. Reproductive: Uterus and bilateral adnexa are unremarkable. Other: No abdominal wall hernia or abnormality. No abdominopelvic ascites. Musculoskeletal: No acute or significant osseous findings. IMPRESSION: 1. No acute abdominal or pelvic pathology. Electronically Signed   By: Kathreen Devoid   On: 03/09/2020 12:53        Scheduled Meds: Continuous Infusions: . sodium chloride 125 mL/hr at 03/10/20 0730  . sodium chloride Stopped (03/10/20 0329)  . cefTRIAXone (ROCEPHIN)  IV 2 g (03/10/20 1349)  . vancomycin 750 mg (03/10/20 1233)     LOS: 1 day    Time spent: 66min    Domenic Polite, MD [facility]  03/10/2020, 2:00 PM

## 2020-03-11 ENCOUNTER — Inpatient Hospital Stay (HOSPITAL_COMMUNITY): Payer: 59 | Admitting: Certified Registered Nurse Anesthetist

## 2020-03-11 ENCOUNTER — Encounter (HOSPITAL_COMMUNITY): Payer: Self-pay | Admitting: Internal Medicine

## 2020-03-11 ENCOUNTER — Inpatient Hospital Stay (HOSPITAL_COMMUNITY): Payer: 59

## 2020-03-11 ENCOUNTER — Encounter (HOSPITAL_COMMUNITY)
Admission: EM | Disposition: A | Discharge status: Other Acute Inpatient Hospital | Payer: Self-pay | Source: Home / Self Care | Attending: Internal Medicine

## 2020-03-11 DIAGNOSIS — I729 Aneurysm of unspecified site: Secondary | ICD-10-CM

## 2020-03-11 DIAGNOSIS — M5459 Other low back pain: Secondary | ICD-10-CM | POA: Diagnosis not present

## 2020-03-11 HISTORY — PX: IR US GUIDE VASC ACCESS RIGHT: IMG2390

## 2020-03-11 HISTORY — PX: IR ANGIO/SPINAL RIGHT: IMG2271

## 2020-03-11 HISTORY — PX: IR ANGIOGRAM SELECTIVE EACH ADDITIONAL VESSEL: IMG667

## 2020-03-11 HISTORY — PX: RADIOLOGY WITH ANESTHESIA: SHX6223

## 2020-03-11 HISTORY — PX: IR ANGIO/SPINAL LEFT: IMG2270

## 2020-03-11 LAB — CBC
HCT: 36.7 % (ref 36.0–46.0)
Hemoglobin: 12.1 g/dL (ref 12.0–15.0)
MCH: 29.7 pg (ref 26.0–34.0)
MCHC: 33 g/dL (ref 30.0–36.0)
MCV: 90 fL (ref 80.0–100.0)
Platelets: 249 10*3/uL (ref 150–400)
RBC: 4.08 MIL/uL (ref 3.87–5.11)
RDW: 11.9 % (ref 11.5–15.5)
WBC: 8.4 10*3/uL (ref 4.0–10.5)
nRBC: 0 % (ref 0.0–0.2)

## 2020-03-11 LAB — BASIC METABOLIC PANEL
Anion gap: 10 (ref 5–15)
BUN: <5 mg/dL — ABNORMAL LOW (ref 6–20)
CO2: 21 mmol/L — ABNORMAL LOW (ref 22–32)
Calcium: 8.6 mg/dL — ABNORMAL LOW (ref 8.9–10.3)
Chloride: 108 mmol/L (ref 98–111)
Creatinine, Ser: 0.67 mg/dL (ref 0.44–1.00)
GFR, Estimated: >60 mL/min (ref >60)
Glucose, Bld: 102 mg/dL — ABNORMAL HIGH (ref 70–99)
Potassium: 3.7 mmol/L (ref 3.5–5.1)
Sodium: 139 mmol/L (ref 135–145)

## 2020-03-11 LAB — RESP PANEL BY RT-PCR (FLU A&B, COVID) ARPGX2
Influenza A by PCR: NEGATIVE
Influenza B by PCR: NEGATIVE
SARS Coronavirus 2 by RT PCR: NEGATIVE

## 2020-03-11 LAB — MRSA PCR SCREENING: MRSA by PCR: NEGATIVE

## 2020-03-11 SURGERY — IR WITH ANESTHESIA
Anesthesia: General

## 2020-03-11 MED ORDER — PROPOFOL 10 MG/ML IV BOLUS
INTRAVENOUS | Status: DC | PRN
  Administered 2020-03-11: 140 mg via INTRAVENOUS

## 2020-03-11 MED ORDER — PROMETHAZINE HCL 25 MG/ML IJ SOLN: 6.2500 mg | INTRAMUSCULAR | Status: DC | PRN

## 2020-03-11 MED ORDER — HYDROMORPHONE HCL 1 MG/ML IJ SOLN
INTRAMUSCULAR | Status: AC
  Filled 2020-03-11: qty 1

## 2020-03-11 MED ORDER — MIDAZOLAM HCL 2 MG/2ML IJ SOLN
INTRAMUSCULAR | Status: AC
  Filled 2020-03-11: qty 2

## 2020-03-11 MED ORDER — FENTANYL CITRATE (PF) 250 MCG/5ML IJ SOLN
INTRAMUSCULAR | Status: DC | PRN
  Administered 2020-03-11: 100 ug via INTRAVENOUS

## 2020-03-11 MED ORDER — FENTANYL CITRATE (PF) 100 MCG/2ML IJ SOLN
INTRAMUSCULAR | Status: AC
  Filled 2020-03-11: qty 2

## 2020-03-11 MED ORDER — HYDROMORPHONE HCL 1 MG/ML IJ SOLN: 1.0000 mg | INTRAMUSCULAR | 0 refills | Status: AC | PRN

## 2020-03-11 MED ORDER — ROCURONIUM BROMIDE 10 MG/ML (PF) SYRINGE
PREFILLED_SYRINGE | INTRAVENOUS | Status: DC | PRN
  Administered 2020-03-11: 60 mg via INTRAVENOUS
  Administered 2020-03-11: 30 mg via INTRAVENOUS

## 2020-03-11 MED ORDER — IOHEXOL 300 MG/ML  SOLN
150.0000 mL | Freq: Once | INTRAMUSCULAR | Status: AC | PRN
  Administered 2020-03-11: 80 mL via INTRA_ARTERIAL

## 2020-03-11 MED ORDER — PHENYLEPHRINE HCL-NACL 10-0.9 MG/250ML-% IV SOLN
INTRAVENOUS | Status: DC | PRN
  Administered 2020-03-11: 40 ug/min via INTRAVENOUS

## 2020-03-11 MED ORDER — MIDAZOLAM HCL 5 MG/5ML IJ SOLN
INTRAMUSCULAR | Status: DC | PRN
  Administered 2020-03-11: 2 mg via INTRAVENOUS

## 2020-03-11 MED ORDER — DEXAMETHASONE SODIUM PHOSPHATE 10 MG/ML IJ SOLN
INTRAMUSCULAR | Status: DC | PRN
  Administered 2020-03-11: 4 mg via INTRAVENOUS

## 2020-03-11 MED ORDER — ALPRAZOLAM 0.5 MG PO TABS: 0.5000 mg | ORAL_TABLET | Freq: Every day | ORAL | Status: DC

## 2020-03-11 MED ORDER — ALPRAZOLAM 0.5 MG PO TABS: 0.5000 mg | ORAL_TABLET | Freq: Every evening | ORAL | Status: DC | PRN

## 2020-03-11 MED ORDER — LACTATED RINGERS IV SOLN: INTRAVENOUS | Status: DC | PRN

## 2020-03-11 MED ORDER — CHLORHEXIDINE GLUCONATE CLOTH 2 % EX PADS
6.0000 | MEDICATED_PAD | Freq: Every day | CUTANEOUS | Status: DC
  Administered 2020-03-11: 6 via TOPICAL

## 2020-03-11 MED ORDER — IOHEXOL 300 MG/ML  SOLN
150.0000 mL | Freq: Once | INTRAMUSCULAR | Status: AC | PRN
  Administered 2020-03-11: 120 mL via INTRA_ARTERIAL

## 2020-03-11 MED ORDER — FENTANYL CITRATE (PF) 100 MCG/2ML IJ SOLN: 25.0000 ug | INTRAMUSCULAR | Status: DC | PRN

## 2020-03-11 MED ORDER — ONDANSETRON HCL 4 MG/2ML IJ SOLN: 4.0000 mg | Freq: Four times a day (QID) | INTRAMUSCULAR | 0 refills | Status: AC | PRN

## 2020-03-11 MED ORDER — HYDROMORPHONE HCL 1 MG/ML IJ SOLN
1.0000 mg | INTRAMUSCULAR | Status: DC | PRN
  Administered 2020-03-11 (×2): 1 mg via INTRAVENOUS
  Administered 2020-03-11: 0.5 mg via INTRAVENOUS
  Administered 2020-03-11: 1 mg via INTRAVENOUS
  Administered 2020-03-11: 0.5 mg via INTRAVENOUS
  Filled 2020-03-11 (×3): qty 1

## 2020-03-11 MED ORDER — SUGAMMADEX SODIUM 200 MG/2ML IV SOLN
INTRAVENOUS | Status: DC | PRN
  Administered 2020-03-11: 200 mg via INTRAVENOUS

## 2020-03-11 MED ORDER — LIDOCAINE 2% (20 MG/ML) 5 ML SYRINGE
INTRAMUSCULAR | Status: DC | PRN
  Administered 2020-03-11: 60 mg via INTRAVENOUS

## 2020-03-11 NOTE — H&P (Signed)
HPI:  The patient has had a H&P performed within the last 30 days, all history, medications, and exam have been reviewed. The patient denies any interval changes since the H&P. Please see progress note from Dr. Gilmer Mor 03/10/2020 for further information.  Patient awake and alert this AM laying in bed. Husband at bedside. Complains of back pain rated 7/10 at this time. Complains of headache, stable since admission. Denies fever, chills, chest pain, dyspnea, or abdominal pain.   Medications: Prior to Admission medications   Medication Sig Start Date End Date Taking? Authorizing Provider  acetaminophen (TYLENOL) 500 MG tablet Take 1,000 mg by mouth in the morning, at noon, in the evening, and at bedtime.   Yes [provider]  ALPRAZolam (XANAX) 0.25 MG tablet Take 0.25 mg by mouth 3 (three) times daily as needed for anxiety.   Yes [provider]  ketorolac (TORADOL) 10 MG tablet Take 10 mg by mouth every 6 (six) hours as needed for moderate pain.   Yes [provider]  methocarbamol (ROBAXIN) 500 MG tablet Take 2 tablets (1,000 mg total) by mouth 3 (three) times daily for 7 days. 03/06/20 03/13/20 Yes Liberty Handy, PA-C     Vital Signs: BP 128/61 (BP Location: Right Arm)   Pulse 77   Temp 98.6 F (37 C) (Oral)   Resp 18   Ht 5\' 9"  (1.753 m)   Wt 135 lb (61.2 kg)   SpO2 98%   BMI 19.94 kg/m   Physical Exam Vitals and nursing note reviewed.  Constitutional:      General: She is not in acute distress.    Appearance: Normal appearance.  Cardiovascular:     Rate and Rhythm: Normal rate and regular rhythm.     Heart sounds: Normal heart sounds. No murmur heard.   Pulmonary:     Effort: Pulmonary effort is normal. No respiratory distress.     Breath sounds: Normal breath sounds. No wheezing.  Skin:    General: Skin is warm and dry.  Neurological:     Mental Status: She is alert and oriented to person, place, and time.     Mallampati  Score:  MD Evaluation Airway: WNL Heart: WNL Abdomen: WNL Chest/ Lungs: WNL ASA  Classification: 2 Mallampati/Airway Score: Two  Labs:  CBC: Recent Labs    03/05/20 1906 03/06/20 0936 03/09/20 1110 03/11/20 0158  WBC 6.6 8.3 10.0 8.4  HGB 14.3 14.3 14.7 12.1  HCT 42.4 41.2 41.9 36.7  PLT 276 295 338 249    COAGS: Recent Labs    03/09/20 1110  INR 1.0    BMP: Recent Labs    03/05/20 1906 03/06/20 0936 03/09/20 1110 03/11/20 0158  NA 135 138 140 139  K 3.0* 3.5 3.7 3.7  CL 101 106 107 108  CO2 22 20* 20* 21*  GLUCOSE 170* 133* 106* 102*  BUN 10 7 11  <5*  CALCIUM 9.0 8.9 9.5 8.6*  CREATININE 0.77 0.80 0.84 0.67  GFRNONAA >60 >60 >60 >60    LIVER FUNCTION TESTS: Recent Labs    03/05/20 1906 03/06/20 0936 03/09/20 1110  BILITOT 1.0 1.1 1.9*  AST 24 21 22   ALT 15 15 20   ALKPHOS 71 67 67  PROT 6.9 6.3* 7.5  ALBUMIN 4.1 3.7 4.4    Assessment/Plan:   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 Plan for image-guided diagnostic spinal arteriogram today in IR with Dr. 03/08/20 and anesthesia. Patient is NPO. Afebrile. She does not take  blood thinners. INR 1.0 03/09/2020. COVID negative 03/09/2020.  Risks and benefits of diagnostic spinal angiogram were discussed with the patient including, but not limited to bleeding, infection, vascular injury, stroke, or contrast induced renal failure. This interventional procedure involves the use of X-rays and because of the nature of the planned procedure, it is possible that we will have prolonged use of X-ray fluoroscopy. Potential radiation risks to you include (but are not limited to) the following: - A slightly elevated risk for cancer  several years later in life. This risk is typically less than 0.5% percent. This risk is low in comparison to the normal incidence of human cancer, which is 33% for women and 50% for men according to the [employer]. - Radiation induced injury can include  skin redness, resembling a rash, tissue breakdown / ulcers and hair loss (which can be temporary or permanent).  The likelihood of either of these occurring depends on the difficulty of the procedure and whether you are sensitive to radiation due to previous procedures, disease, or genetic conditions.  IF your procedure requires a prolonged use of radiation, you will be notified and given written instructions for further action.  It is your responsibility to monitor the irradiated area for the 2 weeks following the procedure and to notify your physician if you are concerned that you have suffered a radiation induced injury.   All of the patient's questions were answered, patient is agreeable to proceed. Consent signed and in IR control room.   Signed: Earley Abide 03/11/2020, 9:20 AM

## 2020-03-11 NOTE — Plan of Care (Signed)

## 2020-03-11 NOTE — Progress Notes (Signed)
PROGRESS NOTE    Taylor Sharp  ZOX:[id number] DOB: 02/18/1961 DOA: 03/09/2020 PCP: Daisy Florooss, Charles Alan, MD  Brief Narrative: 60 year old female with no significant past medical history presented to the ER for the second time with increasing back pain on 12/30. -Over a week ago ate some chicken following which developed diarrhea, had some nausea and mild vomiting at the time, felt to be viral, remembers diarrhea as being pretty profuse, which subsequently resolved, following this developed mid back pain which progressively worsened, she was seen in the ER on 12/27 this is felt to be musculoskeletal and discharged home on pain meds. 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  In addition there was a complex cystic mass at the sacral canal at S1-S2.   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 Loreta AveWagner following  -Underwent CTA chest/thoracic region yesterday concerning for small spinal AVM with associated intradural hemorrhage -Plan for spinal angiogram by NIR today -Suspicion for infection remains low, on empiric vancomycin/ceftriaxone -Continue Dilaudid as needed, add Robaxin, Zofran -Physical therapy  DVT prophylaxis: SCDs Code Status: Full code  Family Communication: Spouse at bedside Disposition Plan:  Status is: Inpatient  Remains inpatient appropriate because:Inpatient level of care appropriate due to severity of illness   Dispo: The patient is from: Home              Anticipated d/c is to: Home              Anticipated d/c date is: unknown              Patient currently is not  medically stable to d/c.  Consultants:   Neurology  NEURO IR   Procedures:   Antimicrobials:    Subjective: -Continues to have severe mid back pain, Dilaudid and Robaxin helping to some extent, denies any numbness or weakness in lower extremities Objective: Vitals:   03/10/20 1950 03/10/20 2344 03/11/20 0429 03/11/20 0813  BP: 135/79 108/71 125/63 128/61  Pulse: 88 76 76 77  Resp: 18 18 18 18   Temp: 99.4 F (37.4 C) 98 F (36.7 C) 98.7 F (37.1 C) 98.6 F (37 C)  TempSrc: Oral Oral Oral Oral  SpO2: 97% 96% 93% 98%  Weight:      Height:        Intake/Output Summary (Last 24 hours) at 03/11/2020 1144 Last data filed at 03/11/2020 [id number] Gross per 24 hour  Intake 2745.25 ml  Output -  Net 2745.25 ml   Filed Weights   03/09/20 0741  Weight: 61.2 kg    Examination:  General exam: Pleasant female, laying in bed, uncomfortable appearing, AAOx3 CVS: S1-S2, regular rate rhythm Lungs: Clear bilaterally Abdomen: Soft, nontender, bowel sounds present Extremities: No edema, no rashes Neuro : Cranial nerves intact, motor upper and lower extremity with 4+ strength, sensations intact, plantars withdrawal Psychiatry:  Mood & affect appropriate.     Data Reviewed:   CBC: Recent Labs  Lab 03/05/20 1906 03/06/20 0936 03/09/20 1110 03/11/20 0158  WBC 6.6 8.3 10.0 8.4  NEUTROABS  --   --  6.6  --   HGB 14.3 14.3 14.7 12.1  HCT 42.4 41.2 41.9 36.7  MCV  89.6 88.2 88.2 90.0  PLT 276 295 338 0000000   Basic Metabolic Panel: Recent Labs  Lab 03/05/20 1906 03/06/20 0936 03/06/20 1445 03/09/20 1110 03/11/20 0158  NA 135 138  --  140 139  K 3.0* 3.5  --  3.7 3.7  CL 101 106  --  107 108  CO2 22 20*  --  20* 21*  GLUCOSE 170* 133*  --  106* 102*  BUN 10 7  --  11 <5*  CREATININE 0.77 0.80  --  0.84 0.67  CALCIUM 9.0 8.9  --  9.5 8.6*  MG  --   --  2.0  --   --    GFR: Estimated Creatinine Clearance: 73.2 mL/min (by C-G formula based on SCr of 0.67 mg/dL). Liver  Function Tests: Recent Labs  Lab 03/05/20 1906 03/06/20 0936 03/09/20 1110  AST 24 21 22   ALT 15 15 20   ALKPHOS 71 67 67  BILITOT 1.0 1.1 1.9*  PROT 6.9 6.3* 7.5  ALBUMIN 4.1 3.7 4.4   Recent Labs  Lab 03/05/20 1906 03/09/20 1110  LIPASE 34 26   No results for input(s): AMMONIA in the last 168 hours. Coagulation Profile: Recent Labs  Lab 03/09/20 1110  INR 1.0   Cardiac Enzymes: Recent Labs  Lab 03/09/20 1110  CKTOTAL 136   BNP (last 3 results) No results for input(s): PROBNP in the last 8760 hours. HbA1C: No results for input(s): HGBA1C in the last 72 hours. CBG: No results for input(s): GLUCAP in the last 168 hours. Lipid Profile: No results for input(s): CHOL, HDL, LDLCALC, TRIG, CHOLHDL, LDLDIRECT in the last 72 hours. Thyroid Function Tests: No results for input(s): TSH, T4TOTAL, FREET4, T3FREE, THYROIDAB in the last 72 hours. Anemia Panel: No results for input(s): VITAMINB12, FOLATE, FERRITIN, TIBC, IRON, RETICCTPCT in the last 72 hours. Urine analysis:    Component Value Date/Time   COLORURINE YELLOW 03/09/2020 1320   APPEARANCEUR CLEAR 03/09/2020 1320   LABSPEC >1.046 (H) 03/09/2020 1320   PHURINE 5.0 03/09/2020 1320   GLUCOSEU NEGATIVE 03/09/2020 1320   HGBUR NEGATIVE 03/09/2020 1320   BILIRUBINUR NEGATIVE 03/09/2020 1320   KETONESUR 5 (A) 03/09/2020 1320   PROTEINUR NEGATIVE 03/09/2020 1320   NITRITE NEGATIVE 03/09/2020 1320   LEUKOCYTESUR SMALL (A) 03/09/2020 1320   Sepsis Labs: @LABRCNTIP (procalcitonin:4,lacticidven:4)  ) Recent Results (from the past 240 hour(s))  Resp Panel by RT-PCR (Flu A&B, Covid) Nasopharyngeal Swab     Status: None   Collection Time: 03/05/20  7:06 PM   Specimen: Nasopharyngeal Swab; Nasopharyngeal(NP) swabs in vial transport medium  Result Value Ref Range Status   SARS Coronavirus 2 by RT PCR NEGATIVE NEGATIVE Final    Comment: (NOTE) SARS-CoV-2 target nucleic acids are NOT DETECTED.  The SARS-CoV-2 RNA is  generally detectable in upper respiratory specimens during the acute phase of infection. The lowest concentration of SARS-CoV-2 viral copies this assay can detect is 138 copies/mL. A negative result does not preclude SARS-Cov-2 infection and should not be used as the sole basis for treatment or other patient management decisions. A negative result may occur with  improper specimen collection/handling, submission of specimen other than nasopharyngeal swab, presence of viral mutation(s) within the areas targeted by this assay, and inadequate number of viral copies(<138 copies/mL). A negative result must be combined with clinical observations, patient history, and epidemiological information. The expected result is Negative.  Fact Sheet for Patients:  EntrepreneurPulse.com.au  Fact Sheet for Healthcare Providers:  IncredibleEmployment.be  This test is  no t yet approved or cleared by the [place] and  has been authorized for detection and/or diagnosis of SARS-CoV-2 by FDA under an Emergency Use Authorization (EUA). This EUA will remain  in effect (meaning this test can be used) for the duration of the COVID-19 declaration under Section 564(b)(1) of the Act, 21 U.S.C.section 360bbb-3(b)(1), unless the authorization is terminated  or revoked sooner.       Influenza A by PCR NEGATIVE NEGATIVE Final   Influenza B by PCR NEGATIVE NEGATIVE Final    Comment: (NOTE) The Xpert Xpress SARS-CoV-2/FLU/RSV plus assay is intended as an aid in the diagnosis of influenza from Nasopharyngeal swab specimens and should not be used as a sole basis for treatment. Nasal washings and aspirates are unacceptable for Xpert Xpress SARS-CoV-2/FLU/RSV testing.  Fact Sheet for Patients: EntrepreneurPulse.com.au  Fact Sheet for Healthcare Providers: IncredibleEmployment.be  This test is not yet approved or cleared by the [place] FDA and has been authorized for detection and/or diagnosis of SARS-CoV-2 by FDA under an Emergency Use Authorization (EUA). This EUA will remain in effect (meaning this test can be used) for the duration of the COVID-19 declaration under Section 564(b)(1) of the Act, 21 U.S.C. section 360bbb-3(b)(1), unless the authorization is terminated or revoked.  Performed at [facility], [address] [place], [place] [address]   Culture, blood (routine x 2)     Status: None (Preliminary result)   Collection Time: 03/09/20 12:01 PM   Specimen: Left Antecubital; Blood  Result Value Ref Range Status   Specimen Description   Final    LEFT ANTECUBITAL Performed at [facility] [address], [place], [place] [address]    Special Requests   Final    BOTTLES DRAWN AEROBIC ONLY Blood Culture adequate volume Performed at [facility] [address], [place], [place] [address]    Culture   Final    NO GROWTH 2 DAYS Performed at [facility] [address] [place], [place] [address]    Report Status PENDING  Incomplete  Culture, blood (routine x 2)     Status: None (Preliminary result)   Collection Time: 03/09/20 12:06 PM   Specimen: Right Antecubital; Blood  Result Value Ref Range Status   Specimen Description   Final    RIGHT ANTECUBITAL Performed at [facility] [address], [place], [place] [address]    Special Requests   Final    BOTTLES DRAWN AEROBIC AND ANAEROBIC Blood Culture adequate volume Performed at [facility] [address] [place], [place] [address]    Culture   Final    NO GROWTH 2 DAYS Performed at [facility] [address] [place], [place] [address]    Report Status PENDING  Incomplete  SARS CORONAVIRUS 2 (TAT 6-24 HRS) Nasopharyngeal Nasopharyngeal Swab     Status: None   Collection Time: 03/09/20  5:47 PM   Specimen: Nasopharyngeal Swab  Result  Value Ref Range Status   SARS Coronavirus 2 NEGATIVE NEGATIVE Final    Comment: (NOTE) SARS-CoV-2 target nucleic acids are NOT DETECTED.  The SARS-CoV-2 RNA is generally detectable in upper and lower respiratory specimens during the acute phase of infection. Negative results do not preclude SARS-CoV-2 infection, do not rule out co-infections with other pathogens, and should not be used as the sole basis for treatment or other patient management decisions. Negative results must be combined with clinical observations,  patient history, and epidemiological information. The expected result is Negative.  Fact Sheet for Patients: SugarRoll.be  Fact Sheet for Healthcare Providers: https://www.woods-mathews.com/  This test is not yet approved or cleared by the [place] FDA and  has been authorized for detection and/or diagnosis of SARS-CoV-2 by FDA under an Emergency Use Authorization (EUA). This EUA will remain  in effect (meaning this test can be used) for the duration of the COVID-19 declaration under Se ction 564(b)(1) of the Act, 21 U.S.C. section 360bbb-3(b)(1), unless the authorization is terminated or revoked sooner.  Performed at [facility], [place] [address], [place], [place] [address]   MRSA PCR Screening     Status: None   Collection Time: 03/11/20  9:18 AM   Specimen: Nasopharyngeal  Result Value Ref Range Status   MRSA by PCR NEGATIVE NEGATIVE Final    Comment:        The GeneXpert MRSA Assay (FDA approved for NASAL specimens only), is one component of a comprehensive MRSA colonization surveillance program. It is not intended to diagnose MRSA infection nor to guide or monitor treatment for MRSA infections. Performed at [facility], [place] [address], [place], [place] [address]          Radiology Studies: CT ANGIO CHEST PE W OR WO CONTRAST  Result Date: 03/10/2020 CLINICAL DATA:  60 year old female  with abnormal thoracic spinal cord and subarachnoid space on MRI. 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 EXAM: CT ANGIOGRAPHY CHEST WITH CONTRAST TECHNIQUE: Multidetector CT imaging of the chest was performed using the standard protocol during bolus administration of intravenous contrast. Multiplanar CT image reconstructions and MIPs were obtained to evaluate the vascular anatomy. CONTRAST:  161mL OMNIPAQUE IOHEXOL 350 MG/ML SOLN COMPARISON:  MRI head and cervical spine earlier today, thoracic and lumbar spine yesterday. CT Abdomen and Pelvis yesterday. FINDINGS: Thoracic spine: Stable thoracic vertebral height and alignment. There is mild dextroconvex scoliosis. No acute osseous abnormality identified. On precontrast images there is subtle increased density in the dorsal spinal canal at the T4-T5 level (series 14, image 36) which on MRI corresponds to an area of intermediate to increased T1 signal, dark T2 and STIR signal. Notably, there is no evidence of macroscopic fat density within the ventral spinal canal nearby at the T5-T6 level. Following contrast there does appear to be mild enhancement of the dorsal T5 region (series 10, image 61) which is more apparent on sagittal MIP (series 12, image 61). And furthermore a small nodular or curvilinear enhancing structure located just deep to the right T5 pedicle can be identified on this exam (series 6, image 49 and series 12, image 59) as well as the postcontrast MRI (series 32, image 9 of that exam). However, there do not appear to be abnormally enlarged thoracic paraspinal vessels, and no abnormally numerous or enlarged vessels entering or leaving the spinal canal can be identified. Cardiovascular: Negative thoracic aorta. Central pulmonary arteries also appear to be enhancing and patent. No cardiomegaly or pericardial effusion. No calcified coronary artery atherosclerosis is evident. Mediastinum/Nodes: Negative.  No lymphadenopathy. Lungs/Pleura: Major  airways are patent. Lungs are clear aside from minor dependent atelectasis. No pleural effusion. Upper Abdomen: By carious excretion of contrast to the gallbladder since the CT Abdomen and Pelvis yesterday. Otherwise stable, negative visible upper abdominal viscera. Musculoskeletal: No acute osseous abnormality identified. Review of the MIP images confirms the above findings. IMPRESSION: 1. Subtle increased density in the dorsal thoracic spinal canal at T4-T5 which does mildly enhance  following contrast administration, along with a small nodular or curvilinear focus of enhancement just inside the right T5 pedicle, which is also identified on the postcontrast MRI yesterday. No clustered or abnormal paraspinal vessels are identified. No CT evidence of intraspinal lipoma. 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 2. Otherwise negative CTA appearance of the chest. Electronically Signed   By: Odessa Fleming M.D.   On: 03/10/2020 12:50   MR BRAIN W WO CONTRAST  Addendum Date: 03/10/2020   ADDENDUM REPORT: 03/10/2020 10:20 ADDENDUM: Study discussed by telephone with Dr. Otelia Limes on 03/10/2020 at 10:13, and also reviewed with Dr. Gilmer Mor NIR. We discussed that the FLAIR subarachnoid signal around the brain and the possible leptomeningeal enhancement along the cervical spinal cord and cervicomedullary junction could be artifactual (3 Tesla imaging). Furthermore, we reverse viewed the thoracic spine MRI from yesterday, and that study demonstrates abnormal INTRINSIC T1 hyperintensity in the subarachnoid space along the abnormal segment of spinal cord more so than abnormal enhancement. Furthermore, the axial GRE appearance to me most resembles blood products around the spinal cord. So at this time noninvasive CTA Thoracic Spine (using chest CTA aortic timing protocol) will be pursued prior to conventional spinal  angiogram. CSF analysis could still be valuable although I am now more suspicious of a vascular/hemorrhage etiology than an inflammatory or infectious process. Electronically Signed   By: Odessa Fleming M.D.   On: 03/10/2020 10:20   Result Date: 03/10/2020 CLINICAL DATA:  60 year old female with back pain, difficulty walking. Abnormal signal and enhancement In the upper thoracic spinal cord, and intraspinal cystic lesions at the sacrum thought to be unrelated. Indeterminate for thoracic transverse myelitis versus spinal vascular malformation. Query evidence of CNS infection. EXAM: MRI HEAD WITHOUT AND WITH CONTRAST MRI CERVICAL SPINE WITHOUT AND WITH CONTRAST TECHNIQUE: Multiplanar, multiecho pulse sequences of the brain and surrounding structures, and cervical spine, to include the craniocervical junction and cervicothoracic junction, were obtained without and with intravenous contrast. CONTRAST:  54mL GADAVIST GADOBUTROL 1 MMOL/ML IV SOLN COMPARISON:  Thoracic and lumbar MRI without and with contrast yesterday. FINDINGS: MRI HEAD FINDINGS Brain: Bilateral choroid plexus cysts, normal variant. Normal cerebral volume. No restricted diffusion to suggest acute infarction. No midline shift, mass effect, evidence of mass lesion, ventriculomegaly, extra-axial collection or acute intracranial hemorrhage. Cervicomedullary junction and pituitary are within normal limits. There is no ventriculomegaly or layering intraventricular debris. There is questionable incomplete suppression of CSF in some of the bilateral cerebral sulci on FLAIR imaging (series 8, image 15, 20). However, this is not apparent on sagittal FLAIR imaging (series 12). This is not associated with abnormal pachymeningeal or leptomeningeal enhancement. And underlying cerebral cortex signal appears to remain normal. There is scattered nonspecific mostly subcortical white matter small foci of T2 and FLAIR hyperintensity in both hemispheres, moderate for age. No  abnormal parenchymal enhancement. No chronic cerebral blood products. Deep gray matter nuclei remain within normal limits, but there is possible abnormal T2 hyperintensity in the periaqueductal gray matter (series 7, image 12). Mamillary bodies appear normal. Possible trace abnormal FLAIR signal in the dorsal pons on series 8, image 8, but elsewhere brainstem signal appears normal. Cerebellum within normal limits. Vascular: Major intracranial vascular flow voids are preserved. The major dural venous sinuses are enhancing and appear to be patent. Skull and upper cervical spine: Cervical spine detailed below. Visualized bone marrow signal is within normal  limits. Sinuses/Orbits: Negative orbits. Trace paranasal sinus mucosal thickening. Other: Grossly normal visible internal auditory structures. Mastoids are clear. Scalp and face soft tissues appear negative. MRI CERVICAL SPINE FINDINGS Alignment: Relatively preserved cervical lordosis. Vertebrae: No marrow edema or evidence of acute osseous abnormality. Some degenerative endplate marrow signal changes but background bone marrow signal is normal. Cord: Unfortunate motion artifact on sagittal T2 and stir sequences. And similar motion on axial T2 and GRE. No definite abnormal signal in the substance of the cervical spinal cord. No cord expansion. 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 Furthermore, the sagittal cervical spine postcontrast images raise the possibility of cerebellar folia leptomeningeal enhancement also (same image). No other spinal dural thickening. Posterior Fossa, vertebral arteries, paraspinal tissues: No definite signal abnormality within the substance of the cervicomedullary junction. Intracranial details above. Preserved  major vascular flow voids in the neck. Negative visible neck soft tissues. Disc levels: Ordinary cervical spine disc and endplate degeneration from C4-C5 through C6-C7 without significant spinal stenosis. There is mild to moderate bilateral C6 neural foraminal stenosis. IMPRESSION: 1. 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 Although the dedicated brain postcontrast imaging does not suggest widespread intracranial leptomeningeal enhancement. 2. The substance of the cervical spinal cord appears to remain normal, although detail is suboptimal due to motion on sagittal and axial images. 3. Questionable incomplete CSF suppression along the convexities on FLAIR imaging. Moderate for age nonspecific mostly subcortical white matter T2 hyperintensities, and questionable abnormal signal in the periaqueductal gray matter. No abnormal brain diffusion or parenchymal enhancement. Optic nerves appear to remain normal. 4. 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 Also a spinal meningeal infection or metastatic disease is difficult to exclude. Other diagnoses considered but felt unlikely include Wernicke Encephalopathy. CSF analysis would be valuable in this case. Electronically Signed: By: Genevie Ann M.D. On: 03/10/2020 09:56   MR CERVICAL SPINE W WO CONTRAST  Addendum Date: 03/10/2020   ADDENDUM REPORT: 03/10/2020 10:20 ADDENDUM: Study discussed by telephone with Dr. Cheral Marker on 03/10/2020 at 10:13, and also reviewed with Dr. Corrie Mckusick NIR. We discussed that the FLAIR subarachnoid signal around the brain and the possible leptomeningeal enhancement along the cervical spinal cord and cervicomedullary junction could be artifactual (3 Tesla imaging). Furthermore, we reverse viewed the thoracic spine MRI from yesterday, and that study demonstrates abnormal INTRINSIC T1  hyperintensity in the subarachnoid space along the abnormal segment of spinal cord more so than abnormal enhancement. Furthermore, the axial GRE appearance to me most resembles blood products around the spinal cord. So at this time noninvasive CTA Thoracic Spine (using chest CTA aortic timing protocol) will be pursued prior to conventional spinal angiogram. CSF analysis could still be valuable although I am now more suspicious of a vascular/hemorrhage etiology than an inflammatory or infectious process. Electronically Signed   By: Genevie Ann M.D.   On: 03/10/2020 10:20   Result Date: 03/10/2020 CLINICAL DATA:  60 year old female with back pain, difficulty walking. Abnormal signal and enhancement In the upper thoracic spinal cord, and intraspinal cystic lesions at the sacrum thought to be unrelated. Indeterminate for thoracic transverse myelitis versus spinal vascular malformation. Query evidence of CNS infection. EXAM: MRI HEAD WITHOUT AND WITH CONTRAST MRI CERVICAL SPINE WITHOUT AND WITH  CONTRAST TECHNIQUE: Multiplanar, multiecho pulse sequences of the brain and surrounding structures, and cervical spine, to include the craniocervical junction and cervicothoracic junction, were obtained without and with intravenous contrast. CONTRAST:  26mL GADAVIST GADOBUTROL 1 MMOL/ML IV SOLN COMPARISON:  Thoracic and lumbar MRI without and with contrast yesterday. FINDINGS: MRI HEAD FINDINGS Brain: Bilateral choroid plexus cysts, normal variant. Normal cerebral volume. No restricted diffusion to suggest acute infarction. No midline shift, mass effect, evidence of mass lesion, ventriculomegaly, extra-axial collection or acute intracranial hemorrhage. Cervicomedullary junction and pituitary are within normal limits. There is no ventriculomegaly or layering intraventricular debris. There is questionable incomplete suppression of CSF in some of the bilateral cerebral sulci on FLAIR imaging (series 8, image 15, 20). However, this is  not apparent on sagittal FLAIR imaging (series 12). This is not associated with abnormal pachymeningeal or leptomeningeal enhancement. And underlying cerebral cortex signal appears to remain normal. There is scattered nonspecific mostly subcortical white matter small foci of T2 and FLAIR hyperintensity in both hemispheres, moderate for age. No abnormal parenchymal enhancement. No chronic cerebral blood products. Deep gray matter nuclei remain within normal limits, but there is possible abnormal T2 hyperintensity in the periaqueductal gray matter (series 7, image 12). Mamillary bodies appear normal. Possible trace abnormal FLAIR signal in the dorsal pons on series 8, image 8, but elsewhere brainstem signal appears normal. Cerebellum within normal limits. Vascular: Major intracranial vascular flow voids are preserved. The major dural venous sinuses are enhancing and appear to be patent. Skull and upper cervical spine: Cervical spine detailed below. Visualized bone marrow signal is within normal limits. Sinuses/Orbits: Negative orbits. Trace paranasal sinus mucosal thickening. Other: Grossly normal visible internal auditory structures. Mastoids are clear. Scalp and face soft tissues appear negative. MRI CERVICAL SPINE FINDINGS Alignment: Relatively preserved cervical lordosis. Vertebrae: No marrow edema or evidence of acute osseous abnormality. Some degenerative endplate marrow signal changes but background bone marrow signal is normal. Cord: Unfortunate motion artifact on sagittal T2 and stir sequences. And similar motion on axial T2 and GRE. No definite abnormal signal in the substance of the cervical spinal cord. No cord expansion. 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 Furthermore, the  sagittal cervical spine postcontrast images raise the possibility of cerebellar folia leptomeningeal enhancement also (same image). No other spinal dural thickening. Posterior Fossa, vertebral arteries, paraspinal tissues: No definite signal abnormality within the substance of the cervicomedullary junction. Intracranial details above. Preserved major vascular flow voids in the neck. Negative visible neck soft tissues. Disc levels: Ordinary cervical spine disc and endplate degeneration from C4-C5 through C6-C7 without significant spinal stenosis. There is mild to moderate bilateral C6 neural foraminal stenosis. IMPRESSION: 1. 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 Although the dedicated brain postcontrast imaging does not suggest widespread intracranial leptomeningeal enhancement. 2. The substance of the cervical spinal cord appears to remain normal, although detail is suboptimal due to motion on sagittal and axial images. 3. Questionable incomplete CSF suppression along the convexities on FLAIR imaging. Moderate for age nonspecific mostly subcortical white matter T2 hyperintensities, and questionable abnormal signal in the periaqueductal gray matter. No abnormal brain diffusion or parenchymal enhancement. Optic nerves appear to remain normal. 4. 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 Also a spinal  meningeal infection or metastatic disease is difficult to exclude. Other diagnoses considered but felt unlikely include Wernicke Encephalopathy. CSF analysis would be valuable in this case. Electronically Signed: By: Genevie Ann M.D. On: 03/10/2020 09:56   MR THORACIC SPINE W WO CONTRAST  Result Date: 03/09/2020 CLINICAL DATA:  Back pain several days. Additionally upper back pain now lower back pain. No fever. Negative for leukocytosis. No weakness  identified. EXAM: MRI THORACIC AND LUMBAR SPINE WITHOUT AND WITH CONTRAST TECHNIQUE: Multiplanar and multiecho pulse sequences of the thoracic and lumbar spine were obtained without and with intravenous contrast. CONTRAST:  6.55mL GADAVIST GADOBUTROL 1 MMOL/ML IV SOLN COMPARISON:  CT abdomen pelvis 03/09/2020 FINDINGS: MRI THORACIC SPINE FINDINGS Alignment:  Normal Vertebrae: Negative for fracture. No evidence of discitis or osteomyelitis. No abnormal enhancement in the vertebra. Cord: Cord hyperintensity centrally at the T4 and T5 level without expansion or abnormal enhancement. There is fatty infiltration in the dura on the right at T3-4 and on the right at T6 and T7. This is relatively mild and not causing cord compression. There are prominent areas of decreased signal within the subarachnoid space posterior to the cord in the thoracic spine. This raises the possibility of enlarged vessels from a vascular malformation however they do not seem to enhance postcontrast administration. Paraspinal and other soft tissues: No paraspinous mass or fluid collection Disc levels: Mild disc degeneration throughout the thoracic spine with mild disc degeneration but no disc protrusion. MRI LUMBAR SPINE FINDINGS Segmentation:  Normal Alignment:  Normal Vertebrae:  Normal.  No evidence of discitis osteomyelitis. Conus medullaris: Extends to the L1-2 level and appears normal. Cauda equina appears normal in the lumbar spine. Paraspinal and other soft tissues: Negative for paraspinous mass, adenopathy, or fluid collection Disc levels: Mild lumbar disc degeneration without disc protrusion or stenosis. Complex cystic mass in the sacral canal at the S1 and S2 level. Multilocular cysts are present which show mild increased signal on T1 and decreased signal on T2 but no enhancement postcontrast administration. This fills the canal without bony destruction. IMPRESSION: 1. Central cord hyperintensity at the T4-T5 level without mass lesion.  Possible transverse myelitis. Also possible is a dural vascular malformation. There are possible dilated vessels around the spinal cord in the subarachnoid space in the thoracic spine however these could be CSF flow related artifact. 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 2. Complex cystic mass in the sacral canal at S1 and S2 without abnormal enhancement. Favor a benign process such as epidermoid cyst. 3. No MRI evidence of infection in the thoracic or lumbar spine. 4. These results were called by telephone at the time of interpretation on 03/09/2020 at 3:37 pm to provider [facility] , who verbally acknowledged these results. Electronically Signed   By: Franchot Gallo M.D.   On: 03/09/2020 15:40   MR Lumbar Spine W Wo Contrast  Result Date: 03/09/2020 CLINICAL DATA:  Back pain several days. Additionally upper back pain now lower back pain. No fever. Negative for leukocytosis. No weakness identified. EXAM: MRI THORACIC AND LUMBAR SPINE WITHOUT AND WITH CONTRAST TECHNIQUE: Multiplanar and multiecho pulse sequences of the thoracic and lumbar spine were obtained without and with intravenous contrast. CONTRAST:  6.19mL GADAVIST GADOBUTROL 1 MMOL/ML IV SOLN COMPARISON:  CT abdomen pelvis 03/09/2020 FINDINGS: MRI THORACIC SPINE FINDINGS Alignment:  Normal Vertebrae: Negative for fracture. No evidence of discitis or osteomyelitis. No abnormal enhancement in the vertebra. Cord: Cord hyperintensity centrally  at the T4 and T5 level without expansion or abnormal enhancement. There is fatty infiltration in the dura on the right at T3-4 and on the right at T6 and T7. This is relatively mild and not causing cord compression. There are prominent areas of decreased signal within the subarachnoid space posterior to the cord in the thoracic spine. This raises the possibility of enlarged vessels from a vascular malformation however they do not seem to enhance postcontrast  administration. Paraspinal and other soft tissues: No paraspinous mass or fluid collection Disc levels: Mild disc degeneration throughout the thoracic spine with mild disc degeneration but no disc protrusion. MRI LUMBAR SPINE FINDINGS Segmentation:  Normal Alignment:  Normal Vertebrae:  Normal.  No evidence of discitis osteomyelitis. Conus medullaris: Extends to the L1-2 level and appears normal. Cauda equina appears normal in the lumbar spine. Paraspinal and other soft tissues: Negative for paraspinous mass, adenopathy, or fluid collection Disc levels: Mild lumbar disc degeneration without disc protrusion or stenosis. Complex cystic mass in the sacral canal at the S1 and S2 level. Multilocular cysts are present which show mild increased signal on T1 and decreased signal on T2 but no enhancement postcontrast administration. This fills the canal without bony destruction. IMPRESSION: 1. Central cord hyperintensity at the T4-T5 level without mass lesion. Possible transverse myelitis. Also possible is a dural vascular malformation. There are possible dilated vessels around the spinal cord in the subarachnoid space in the thoracic spine however these could be CSF flow related artifact. 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 2. Complex cystic mass in the sacral canal at S1 and S2 without abnormal enhancement. Favor a benign process such as epidermoid cyst. 3. No MRI evidence of infection in the thoracic or lumbar spine. 4. These results were called by telephone at the time of interpretation on 03/09/2020 at 3:37 pm to provider [facility] , who verbally acknowledged these results. Electronically Signed   By: Franchot Gallo M.D.   On: 03/09/2020 15:40   CT Abdomen Pelvis W Contrast  Result Date: 03/09/2020 CLINICAL DATA:  Back pain for several days EXAM: CT ABDOMEN AND PELVIS WITH CONTRAST TECHNIQUE: Multidetector CT imaging of the abdomen and pelvis was  performed using the standard protocol following bolus administration of intravenous contrast. CONTRAST:  156mL OMNIPAQUE IOHEXOL 300 MG/ML  SOLN COMPARISON:  None. FINDINGS: Lower chest: No acute abnormality. Hepatobiliary: No focal liver abnormality is seen. No gallstones, gallbladder wall thickening, or biliary dilatation. Pancreas: Unremarkable. No pancreatic ductal dilatation or surrounding inflammatory changes. Spleen: Normal in size without focal abnormality. Adrenals/Urinary Tract: Adrenal glands are unremarkable. Kidneys are normal, without renal calculi, focal lesion, or hydronephrosis. Bladder is unremarkable. Stomach/Bowel: Stomach is within normal limits. Appendix appears normal. No evidence of bowel wall thickening, distention, or inflammatory changes. Vascular/Lymphatic: No significant vascular findings are present. No enlarged abdominal or pelvic lymph nodes. Reproductive: Uterus and bilateral adnexa are unremarkable. Other: No abdominal wall hernia or abnormality. No abdominopelvic ascites. Musculoskeletal: No acute or significant osseous findings. IMPRESSION: 1. No acute abdominal or pelvic pathology. Electronically Signed   By: Kathreen Devoid   On: 03/09/2020 12:53        Scheduled Meds: . [MAR Hold] ALPRAZolam  0.5 mg Oral QHS   Continuous Infusions: . [MAR Hold] sodium chloride Stopped (03/10/20 2117)  . [MAR Hold] cefTRIAXone (ROCEPHIN)  IV Stopped (03/11/20 0000)  . [MAR Hold] methocarbamol (ROBAXIN) IV 500 mg (03/11/20 0653)  . [facility] Hold]  vancomycin Stopped (03/10/20 2217)     LOS: 2 days    Time spent: 45min  Domenic Polite, MD [facility]  03/11/2020, 11:44 AM

## 2020-03-11 NOTE — Transfer of Care (Signed)
Immediate Anesthesia Transfer of Care Note  Patient: Taylor Sharp  Procedure(s) Performed: IR WITH ANESTHESIA (N/A )  Patient Location: PACU  Anesthesia Type:General  Level of Consciousness: awake  Airway & Oxygen Therapy: Patient Spontanous Breathing and Patient connected to nasal cannula oxygen  Post-op Assessment: Report given to RN and Post -op Vital signs reviewed and stable  Post vital signs: Reviewed and stable  Last Vitals:  Vitals Value Taken Time  BP 124/70 03/11/20 1239  Temp    Pulse 90 03/11/20 1245  Resp 14 03/11/20 1245  SpO2 99 % 03/11/20 1245  Vitals shown include unvalidated device data.  Last Pain:  Vitals:   03/11/20 0904  TempSrc:   PainSc: 8       Patients Stated Pain Goal: 3 (03/11/20 0800)  Complications: No complications documented.

## 2020-03-11 NOTE — Anesthesia Preprocedure Evaluation (Addendum)
Anesthesia Evaluation  Patient identified by MRN, date of birth, ID band Patient awake    Reviewed: Allergy & Precautions, NPO status , Patient's Chart, lab work & pertinent test results  History of Anesthesia Complications Negative for: history of anesthetic complications  Airway Mallampati: II  TM Distance: >3 FB Neck ROM: Full    Dental  (+) Teeth Intact, Dental Advisory Given   Pulmonary asthma ,    Pulmonary exam normal breath sounds clear to auscultation       Cardiovascular negative cardio ROS Normal cardiovascular exam Rhythm:Regular Rate:Normal     Neuro/Psych Spinal hemorrhage    GI/Hepatic Neg liver ROS, GERD  ,  Endo/Other  negative endocrine ROS  Renal/GU negative Renal ROS     Musculoskeletal negative musculoskeletal ROS (+)   Abdominal   Peds  Hematology negative hematology ROS (+)   Anesthesia Other Findings Day of surgery medications reviewed with the patient.  Reproductive/Obstetrics                            Anesthesia Physical Anesthesia Plan  ASA: II  Anesthesia Plan: General   Post-op Pain Management:    Induction: Intravenous  PONV Risk Score and Plan: 3 and Midazolam, Dexamethasone and Ondansetron  Airway Management Planned: Oral ETT  Additional Equipment:   Intra-op Plan:   Post-operative Plan: Extubation in OR  Informed Consent: I have reviewed the patients History and Physical, chart, labs and discussed the procedure including the risks, benefits and alternatives for the proposed anesthesia with the patient or authorized representative who has indicated his/her understanding and acceptance.     Dental advisory given  Plan Discussed with: CRNA  Anesthesia Plan Comments:         Anesthesia Quick Evaluation

## 2020-03-11 NOTE — Anesthesia Procedure Notes (Signed)
Procedure Name: Intubation Performed by: Anetha Slagel H, CRNA Pre-anesthesia Checklist: Patient identified, Emergency Drugs available, Suction available and Patient being monitored Patient Re-evaluated:Patient Re-evaluated prior to induction Oxygen Delivery Method: Circle System Utilized Preoxygenation: Pre-oxygenation with 100% oxygen Induction Type: IV induction Ventilation: Mask ventilation without difficulty Laryngoscope Size: Miller and 2 Grade View: Grade I Tube type: Oral Tube size: 7.5 mm Number of attempts: 1 Airway Equipment and Method: Stylet and Oral airway Placement Confirmation: ETT inserted through vocal cords under direct vision,  positive ETCO2 and breath sounds checked- equal and bilateral Secured at: 21 cm Tube secured with: Tape Dental Injury: Teeth and Oropharynx as per pre-operative assessment        

## 2020-03-11 NOTE — Progress Notes (Addendum)
Subjective: Tolerated the procedure well. 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   Objective: Current vital signs: BP 135/71 (BP Location: Right Arm)   Pulse 86   Temp 98.2 F (36.8 C) (Oral)   Resp 16   Ht 5\' 9"  (1.753 m)   Wt 61.2 kg   SpO2 98%   BMI 19.94 kg/m  Vital signs in last 24 hours: Temp:  [97.8 F (36.6 C)-99.4 F (37.4 C)] 98.2 F (36.8 C) (01/01 1615) Pulse Rate:  [76-91] 86 (01/01 1615) Resp:  [15-20] 16 (01/01 1600) BP: (108-138)/(61-79) 135/71 (01/01 1615) SpO2:  [93 %-100 %] 98 % (01/01 1615) FiO2 (%):  [21 %] 21 % (01/01 1245)  Intake/Output from previous day: 12/31 0701 - 01/01 0700 In: 2745.3 [P.O.:600; I.V.:1706.3; IV Piggyback:439] Out: -  Intake/Output this shift: Total I/O In: 690.3 [I.V.:600; IV Piggyback:90.3] Out: 750 [Urine:750] Nutritional status:  Diet Order            Diet regular Room service appropriate? Yes; Fluid consistency: Thin  Diet effective now                HEENT: [place]/AT Lungs: Respirations unlabored Ext: No edema  Neurologic Exam: Ment: Intact to complex questions and commands.  CN: Fixates and tracks normally. EOMI. Face symmetric.  Motor: BUE 5/5. ADF/APF 5/5 Sensory: Intact to FT, distal limbs Cerebellar: No ataxia with FNF bilaterally.   Lab Results: Results for orders placed or performed during the hospital encounter of 03/09/20 (from the past 48 hour(s))  SARS CORONAVIRUS 2 (TAT 6-24 HRS) Nasopharyngeal Nasopharyngeal Swab     Status: None   Collection Time: 03/09/20  5:47 PM   Specimen: Nasopharyngeal Swab  Result Value Ref Range   SARS Coronavirus 2 NEGATIVE NEGATIVE    Comment: (NOTE) SARS-CoV-2 target nucleic acids are NOT DETECTED.  The SARS-CoV-2 RNA is generally detectable in upper and lower respiratory specimens during the acute phase of infection. Negative results do not preclude SARS-CoV-2  infection, do not rule out co-infections with other pathogens, and should not be used as the sole basis for treatment or other patient management decisions. Negative results must be combined with clinical observations, patient history, and epidemiological information. The expected result is Negative.  Fact Sheet for Patients: SugarRoll.be  Fact Sheet for Healthcare Providers: https://www.woods-mathews.com/  This test is not yet approved or cleared by the [place] FDA and  has been authorized for detection and/or diagnosis of SARS-CoV-2 by FDA under an Emergency Use Authorization (EUA). This EUA will remain  in effect (meaning this test can be used) for the duration of the COVID-19 declaration under Se ction 564(b)(1) of the Act, 21 U.S.C. section 360bbb-3(b)(1), unless the authorization is terminated or revoked sooner.  Performed at [facility], [place] [address] [place], [place] [address]   HIV Antibody (routine testing w rflx)     Status: None   Collection Time: 03/10/20  3:19 AM  Result Value Ref Range   HIV Screen 4th Generation wRfx Non Reactive Non Reactive    Comment: Performed at [facility], [place] [address], [place] [address]  CBC     Status: None   Collection Time: 03/11/20  1:58 AM  Result Value Ref Range   WBC 8.4 4.0 - 10.5 K/uL   RBC 4.08 3.87 - 5.11 MIL/uL   Hemoglobin 12.1 12.0 - 15.0 g/dL   HCT 36.7 36.0 - 46.0 %  MCV 90.0 80.0 - 100.0 fL   MCH 29.7 26.0 - 34.0 pg   MCHC 33.0 30.0 - 36.0 g/dL   RDW 27.0 62.3 - 76.2 %   Platelets 249 150 - 400 K/uL   nRBC 0.0 0.0 - 0.2 %    Comment: Performed at [facility], [address], [place], [place] [address]  Basic metabolic panel     Status: Abnormal   Collection Time: 03/11/20  1:58 AM  Result Value Ref Range   Sodium 139 135 - 145 mmol/L   Potassium 3.7 3.5 - 5.1 mmol/L   Chloride 108 98 - 111 mmol/L   CO2 21 (L) 22 - 32 mmol/L    Glucose, Bld 102 (H) 70 - 99 mg/dL    Comment: Glucose reference range applies only to samples taken after fasting for at least 8 hours.   BUN <5 (L) 6 - 20 mg/dL   Creatinine, Ser 7.61 0.44 - 1.00 mg/dL   Calcium 8.6 (L) 8.9 - 10.3 mg/dL   GFR, Estimated >60 >73 mL/min    Comment: (NOTE) Calculated using the CKD-EPI Creatinine Equation (2021)    Anion gap 10 5 - 15    Comment: Performed at [facility], [address], [place], [place] [address]  MRSA PCR Screening     Status: None   Collection Time: 03/11/20  9:18 AM   Specimen: Nasopharyngeal  Result Value Ref Range   MRSA by PCR NEGATIVE NEGATIVE    Comment:        The GeneXpert MRSA Assay (FDA approved for NASAL specimens only), is one component of a comprehensive MRSA colonization surveillance program. It is not intended to diagnose MRSA infection nor to guide or monitor treatment for MRSA infections. Performed at [facility], [address], [place], [place] [address]     Recent Results (from the past 240 hour(s))  Resp Panel by RT-PCR (Flu A&B, Covid) Nasopharyngeal Swab     Status: None   Collection Time: 03/05/20  7:06 PM   Specimen: Nasopharyngeal Swab; Nasopharyngeal(NP) swabs in vial transport medium  Result Value Ref Range Status   SARS Coronavirus 2 by RT PCR NEGATIVE NEGATIVE Final    Comment: (NOTE) SARS-CoV-2 target nucleic acids are NOT DETECTED.  The SARS-CoV-2 RNA is generally detectable in upper respiratory specimens during the acute phase of infection. The lowest concentration of SARS-CoV-2 viral copies this assay can detect is 138 copies/mL. A negative result does not preclude SARS-Cov-2 infection and should not be used as the sole basis for treatment or other patient management decisions. A negative result may occur with  improper specimen collection/handling, submission of specimen other than nasopharyngeal swab, presence of viral mutation(s) within the areas targeted by this  assay, and inadequate number of viral copies(<138 copies/mL). A negative result must be combined with clinical observations, patient history, and epidemiological information. The expected result is Negative.  Fact Sheet for Patients:  BloggerCourse.com  Fact Sheet for Healthcare Providers:  SeriousBroker.it  This test is no t yet approved or cleared by the [place] FDA and  has been authorized for detection and/or diagnosis of SARS-CoV-2 by FDA under an Emergency Use Authorization (EUA). This EUA will remain  in effect (meaning this test can be used) for the duration of the COVID-19 declaration under Section 564(b)(1) of the Act, 21 U.S.C.section 360bbb-3(b)(1), unless the authorization is terminated  or revoked sooner.       Influenza A by PCR NEGATIVE NEGATIVE  Final   Influenza B by PCR NEGATIVE NEGATIVE Final    Comment: (NOTE) The Xpert Xpress SARS-CoV-2/FLU/RSV plus assay is intended as an aid in the diagnosis of influenza from Nasopharyngeal swab specimens and should not be used as a sole basis for treatment. Nasal washings and aspirates are unacceptable for Xpert Xpress SARS-CoV-2/FLU/RSV testing.  Fact Sheet for Patients: EntrepreneurPulse.com.au  Fact Sheet for Healthcare Providers: IncredibleEmployment.be  This test is not yet approved or cleared by the [place] FDA and has been authorized for detection and/or diagnosis of SARS-CoV-2 by FDA under an Emergency Use Authorization (EUA). This EUA will remain in effect (meaning this test can be used) for the duration of the COVID-19 declaration under Section 564(b)(1) of the Act, 21 U.S.C. section 360bbb-3(b)(1), unless the authorization is terminated or revoked.  Performed at [facility], [address] [place], [place] [address]   Culture, blood (routine x 2)     Status: None (Preliminary result)    Collection Time: 03/09/20 12:01 PM   Specimen: Left Antecubital; Blood  Result Value Ref Range Status   Specimen Description   Final    LEFT ANTECUBITAL Performed at [place] [address] [place], [place] [address]    Special Requests   Final    BOTTLES DRAWN AEROBIC ONLY Blood Culture adequate volume Performed at [facility] [address], [place], [place] [address]    Culture   Final    NO GROWTH 2 DAYS Performed at [facility] [address], [place], [place] [address]    Report Status PENDING  Incomplete  Culture, blood (routine x 2)     Status: None (Preliminary result)   Collection Time: 03/09/20 12:06 PM   Specimen: Right Antecubital; Blood  Result Value Ref Range Status   Specimen Description   Final    RIGHT ANTECUBITAL Performed at [facility] [address], [place], [place] [address]    Special Requests   Final    BOTTLES DRAWN AEROBIC AND ANAEROBIC Blood Culture adequate volume Performed at [facility] [address], [place], [place] [address]    Culture   Final    NO GROWTH 2 DAYS Performed at [facility] [address] [place], [place] [address]    Report Status PENDING  Incomplete  SARS CORONAVIRUS 2 (TAT 6-24 HRS) Nasopharyngeal Nasopharyngeal Swab     Status: None   Collection Time: 03/09/20  5:47 PM   Specimen: Nasopharyngeal Swab  Result Value Ref Range Status   SARS Coronavirus 2 NEGATIVE NEGATIVE Final    Comment: (NOTE) SARS-CoV-2 target nucleic acids are NOT DETECTED.  The SARS-CoV-2 RNA is generally detectable in upper and lower respiratory specimens during the acute phase of infection. Negative results do not preclude SARS-CoV-2 infection, do not rule out co-infections with other pathogens, and should not be used as the sole basis for treatment or other patient management decisions. Negative results must be combined with clinical  observations, patient history, and epidemiological information. The expected result is Negative.  Fact Sheet for Patients: SugarRoll.be  Fact Sheet for Healthcare Providers: https://www.woods-mathews.com/  This test is not yet approved or cleared by the [place] FDA and  has been authorized for detection and/or diagnosis of SARS-CoV-2 by FDA under an Emergency Use Authorization (EUA). This EUA will remain  in effect (meaning this test can be used) for the duration of the COVID-19 declaration under Se ction 564(b)(1)  of the Act, 21 U.S.C. section 360bbb-3(b)(1), unless the authorization is terminated or revoked sooner.  Performed at [facility], [place] [address], [place], [place] [address]   MRSA PCR Screening     Status: None   Collection Time: 03/11/20  9:18 AM   Specimen: Nasopharyngeal  Result Value Ref Range Status   MRSA by PCR NEGATIVE NEGATIVE Final    Comment:        The GeneXpert MRSA Assay (FDA approved for NASAL specimens only), is one component of a comprehensive MRSA colonization surveillance program. It is not intended to diagnose MRSA infection nor to guide or monitor treatment for MRSA infections. Performed at [facility], [place] [address], [place], [place] [address]     Lipid Panel No results for input(s): CHOL, TRIG, HDL, CHOLHDL, VLDL, LDLCALC in the last 72 hours.  Studies/Results: CT ANGIO CHEST PE W OR WO CONTRAST  Result Date: 03/10/2020 CLINICAL DATA:  60 year old female with abnormal thoracic spinal cord and subarachnoid space on MRI. 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 EXAM: CT ANGIOGRAPHY CHEST WITH CONTRAST TECHNIQUE: Multidetector CT imaging of the chest was performed using the standard protocol during bolus administration of intravenous contrast. Multiplanar CT image reconstructions and MIPs were obtained to evaluate the vascular anatomy. CONTRAST:  113mL OMNIPAQUE  IOHEXOL 350 MG/ML SOLN COMPARISON:  MRI head and cervical spine earlier today, thoracic and lumbar spine yesterday. CT Abdomen and Pelvis yesterday. FINDINGS: Thoracic spine: Stable thoracic vertebral height and alignment. There is mild dextroconvex scoliosis. No acute osseous abnormality identified. On precontrast images there is subtle increased density in the dorsal spinal canal at the T4-T5 level (series 14, image 36) which on MRI corresponds to an area of intermediate to increased T1 signal, dark T2 and STIR signal. Notably, there is no evidence of macroscopic fat density within the ventral spinal canal nearby at the T5-T6 level. Following contrast there does appear to be mild enhancement of the dorsal T5 region (series 10, image 61) which is more apparent on sagittal MIP (series 12, image 61). And furthermore a small nodular or curvilinear enhancing structure located just deep to the right T5 pedicle can be identified on this exam (series 6, image 49 and series 12, image 59) as well as the postcontrast MRI (series 32, image 9 of that exam). However, there do not appear to be abnormally enlarged thoracic paraspinal vessels, and no abnormally numerous or enlarged vessels entering or leaving the spinal canal can be identified. Cardiovascular: Negative thoracic aorta. Central pulmonary arteries also appear to be enhancing and patent. No cardiomegaly or pericardial effusion. No calcified coronary artery atherosclerosis is evident. Mediastinum/Nodes: Negative.  No lymphadenopathy. Lungs/Pleura: Major airways are patent. Lungs are clear aside from minor dependent atelectasis. No pleural effusion. Upper Abdomen: By carious excretion of contrast to the gallbladder since the CT Abdomen and Pelvis yesterday. Otherwise stable, negative visible upper abdominal viscera. Musculoskeletal: No acute osseous abnormality identified. Review of the MIP images confirms the above findings. IMPRESSION: 1. Subtle increased density in  the dorsal thoracic spinal canal at T4-T5 which does mildly enhance following contrast administration, along with a small nodular or curvilinear focus of enhancement just inside the right T5 pedicle, which is also identified on the postcontrast MRI yesterday. No clustered or abnormal paraspinal vessels are identified. No CT evidence of intraspinal lipoma. 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 2.  Otherwise negative CTA appearance of the chest. Electronically Signed   By: Genevie Ann M.D.   On: 03/10/2020 12:50   MR BRAIN W WO CONTRAST  Addendum Date: 03/10/2020   ADDENDUM REPORT: 03/10/2020 10:20 ADDENDUM: Study discussed by telephone with Dr. Cheral Marker on 03/10/2020 at 10:13, and also reviewed with Dr. Corrie Mckusick NIR. We discussed that the FLAIR subarachnoid signal around the brain and the possible leptomeningeal enhancement along the cervical spinal cord and cervicomedullary junction could be artifactual (3 Tesla imaging). Furthermore, we reverse viewed the thoracic spine MRI from yesterday, and that study demonstrates abnormal INTRINSIC T1 hyperintensity in the subarachnoid space along the abnormal segment of spinal cord more so than abnormal enhancement. Furthermore, the axial GRE appearance to me most resembles blood products around the spinal cord. So at this time noninvasive CTA Thoracic Spine (using chest CTA aortic timing protocol) will be pursued prior to conventional spinal angiogram. CSF analysis could still be valuable although I am now more suspicious of a vascular/hemorrhage etiology than an inflammatory or infectious process. Electronically Signed   By: Genevie Ann M.D.   On: 03/10/2020 10:20   Result Date: 03/10/2020 CLINICAL DATA:  60 year old female with back pain, difficulty walking. Abnormal signal and enhancement In the upper thoracic spinal cord, and intraspinal cystic lesions  at the sacrum thought to be unrelated. Indeterminate for thoracic transverse myelitis versus spinal vascular malformation. Query evidence of CNS infection. EXAM: MRI HEAD WITHOUT AND WITH CONTRAST MRI CERVICAL SPINE WITHOUT AND WITH CONTRAST TECHNIQUE: Multiplanar, multiecho pulse sequences of the brain and surrounding structures, and cervical spine, to include the craniocervical junction and cervicothoracic junction, were obtained without and with intravenous contrast. CONTRAST:  46mL GADAVIST GADOBUTROL 1 MMOL/ML IV SOLN COMPARISON:  Thoracic and lumbar MRI without and with contrast yesterday. FINDINGS: MRI HEAD FINDINGS Brain: Bilateral choroid plexus cysts, normal variant. Normal cerebral volume. No restricted diffusion to suggest acute infarction. No midline shift, mass effect, evidence of mass lesion, ventriculomegaly, extra-axial collection or acute intracranial hemorrhage. Cervicomedullary junction and pituitary are within normal limits. There is no ventriculomegaly or layering intraventricular debris. There is questionable incomplete suppression of CSF in some of the bilateral cerebral sulci on FLAIR imaging (series 8, image 15, 20). However, this is not apparent on sagittal FLAIR imaging (series 12). This is not associated with abnormal pachymeningeal or leptomeningeal enhancement. And underlying cerebral cortex signal appears to remain normal. There is scattered nonspecific mostly subcortical white matter small foci of T2 and FLAIR hyperintensity in both hemispheres, moderate for age. No abnormal parenchymal enhancement. No chronic cerebral blood products. Deep gray matter nuclei remain within normal limits, but there is possible abnormal T2 hyperintensity in the periaqueductal gray matter (series 7, image 12). Mamillary bodies appear normal. Possible trace abnormal FLAIR signal in the dorsal pons on series 8, image 8, but elsewhere brainstem signal appears normal. Cerebellum within normal limits.  Vascular: Major intracranial vascular flow voids are preserved. The major dural venous sinuses are enhancing and appear to be patent. Skull and upper cervical spine: Cervical spine detailed below. Visualized bone marrow signal is within normal limits. Sinuses/Orbits: Negative orbits. Trace paranasal sinus mucosal thickening. Other: Grossly normal visible internal auditory structures. Mastoids are clear. Scalp and face soft tissues appear negative. MRI CERVICAL SPINE FINDINGS Alignment: Relatively preserved cervical lordosis. Vertebrae: No marrow edema or evidence of acute osseous abnormality. Some degenerative endplate marrow signal changes but background bone marrow signal is normal. Cord: Unfortunate motion artifact on sagittal T2 and stir sequences.  And similar motion on axial T2 and GRE. No definite abnormal signal in the substance of the cervical spinal cord. No cord expansion. 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 Furthermore, the sagittal cervical spine postcontrast images raise the possibility of cerebellar folia leptomeningeal enhancement also (same image). No other spinal dural thickening. Posterior Fossa, vertebral arteries, paraspinal tissues: No definite signal abnormality within the substance of the cervicomedullary junction. Intracranial details above. Preserved major vascular flow voids in the neck. Negative visible neck soft tissues. Disc levels: Ordinary cervical spine disc and endplate degeneration from C4-C5 through C6-C7 without significant spinal stenosis. There is mild to moderate bilateral C6 neural foraminal stenosis. IMPRESSION: 1. 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 Although the dedicated brain postcontrast imaging does not suggest widespread intracranial leptomeningeal enhancement. 2. The substance of the cervical spinal cord appears to remain normal, although detail is suboptimal due to motion on sagittal and axial images. 3. Questionable incomplete CSF suppression along the convexities on FLAIR imaging. Moderate for age nonspecific mostly subcortical white matter T2 hyperintensities, and questionable abnormal signal in the periaqueductal gray matter. No abnormal brain diffusion or parenchymal enhancement. Optic nerves appear to remain normal. 4. 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 Also a spinal meningeal infection or metastatic disease is difficult to exclude. Other diagnoses considered but felt unlikely include Wernicke Encephalopathy. CSF analysis would be valuable in this case. Electronically Signed: By: Genevie Ann M.D. On: 03/10/2020 09:56   MR CERVICAL SPINE W WO CONTRAST  Addendum Date: 03/10/2020   ADDENDUM REPORT: 03/10/2020 10:20 ADDENDUM: Study discussed by telephone with Dr. Cheral Marker on 03/10/2020 at 10:13, and also reviewed with Dr. Corrie Mckusick NIR. We discussed that the FLAIR subarachnoid signal around the brain and the possible leptomeningeal enhancement along the cervical spinal cord and cervicomedullary junction could be artifactual (3 Tesla imaging). Furthermore, we reverse viewed the thoracic spine MRI from yesterday, and that study demonstrates abnormal INTRINSIC T1 hyperintensity in the subarachnoid space along the abnormal segment of spinal cord more so than abnormal enhancement. Furthermore, the axial GRE appearance to me most resembles blood products around the spinal cord. So at this time noninvasive CTA Thoracic Spine (using chest CTA aortic timing protocol) will be pursued prior to conventional spinal angiogram. CSF analysis could still be valuable although I am now more  suspicious of a vascular/hemorrhage etiology than an inflammatory or infectious process. Electronically Signed   By: Genevie Ann M.D.   On: 03/10/2020 10:20   Result Date: 03/10/2020 CLINICAL DATA:  60 year old female with back pain, difficulty walking. Abnormal signal and enhancement In the upper thoracic spinal cord, and intraspinal cystic lesions at the sacrum thought to be unrelated. Indeterminate for thoracic transverse myelitis versus spinal vascular malformation. Query evidence of CNS infection. EXAM: MRI HEAD WITHOUT AND WITH CONTRAST MRI CERVICAL SPINE WITHOUT AND WITH CONTRAST TECHNIQUE: Multiplanar, multiecho pulse sequences of the brain and surrounding structures, and cervical spine, to include the craniocervical junction and cervicothoracic junction, were obtained without and with intravenous contrast. CONTRAST:  64mL GADAVIST GADOBUTROL 1 MMOL/ML IV SOLN COMPARISON:  Thoracic and lumbar MRI without and with contrast yesterday. FINDINGS: MRI HEAD FINDINGS Brain: Bilateral choroid plexus cysts, normal variant. Normal cerebral volume. No restricted diffusion to  suggest acute infarction. No midline shift, mass effect, evidence of mass lesion, ventriculomegaly, extra-axial collection or acute intracranial hemorrhage. Cervicomedullary junction and pituitary are within normal limits. There is no ventriculomegaly or layering intraventricular debris. There is questionable incomplete suppression of CSF in some of the bilateral cerebral sulci on FLAIR imaging (series 8, image 15, 20). However, this is not apparent on sagittal FLAIR imaging (series 12). This is not associated with abnormal pachymeningeal or leptomeningeal enhancement. And underlying cerebral cortex signal appears to remain normal. There is scattered nonspecific mostly subcortical white matter small foci of T2 and FLAIR hyperintensity in both hemispheres, moderate for age. No abnormal parenchymal enhancement. No chronic cerebral blood products. Deep  gray matter nuclei remain within normal limits, but there is possible abnormal T2 hyperintensity in the periaqueductal gray matter (series 7, image 12). Mamillary bodies appear normal. Possible trace abnormal FLAIR signal in the dorsal pons on series 8, image 8, but elsewhere brainstem signal appears normal. Cerebellum within normal limits. Vascular: Major intracranial vascular flow voids are preserved. The major dural venous sinuses are enhancing and appear to be patent. Skull and upper cervical spine: Cervical spine detailed below. Visualized bone marrow signal is within normal limits. Sinuses/Orbits: Negative orbits. Trace paranasal sinus mucosal thickening. Other: Grossly normal visible internal auditory structures. Mastoids are clear. Scalp and face soft tissues appear negative. MRI CERVICAL SPINE FINDINGS Alignment: Relatively preserved cervical lordosis. Vertebrae: No marrow edema or evidence of acute osseous abnormality. Some degenerative endplate marrow signal changes but background bone marrow signal is normal. Cord: Unfortunate motion artifact on sagittal T2 and stir sequences. And similar motion on axial T2 and GRE. No definite abnormal signal in the substance of the cervical spinal cord. No cord expansion. 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 Furthermore, the sagittal cervical spine postcontrast images raise the possibility of cerebellar folia leptomeningeal enhancement also (same image). No other spinal dural thickening. Posterior Fossa, vertebral arteries, paraspinal tissues: No definite signal abnormality within the substance of the cervicomedullary junction. Intracranial details above. Preserved major vascular flow voids in the neck. Negative visible neck soft tissues. Disc  levels: Ordinary cervical spine disc and endplate degeneration from C4-C5 through C6-C7 without significant spinal stenosis. There is mild to moderate bilateral C6 neural foraminal stenosis. IMPRESSION: 1. 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 Although the dedicated brain postcontrast imaging does not suggest widespread intracranial leptomeningeal enhancement. 2. The substance of the cervical spinal cord appears to remain normal, although detail is suboptimal due to motion on sagittal and axial images. 3. Questionable incomplete CSF suppression along the convexities on FLAIR imaging. Moderate for age nonspecific mostly subcortical white matter T2 hyperintensities, and questionable abnormal signal in the periaqueductal gray matter. No abnormal brain diffusion or parenchymal enhancement. Optic nerves appear to remain normal. 4. 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 Also a spinal meningeal infection or metastatic disease is difficult to exclude. Other diagnoses considered but felt unlikely include Wernicke Encephalopathy. CSF analysis would be valuable in this case. Electronically Signed: By: Genevie Ann M.D. On: 03/10/2020 09:56    Medications:  Scheduled: . Chlorhexidine Gluconate Cloth  6 each Topical Daily  . HYDROmorphone       Continuous: . sodium chloride Stopped (03/10/20 2117)  .  cefTRIAXone (ROCEPHIN)  IV 2 g (03/11/20 1541)  . methocarbamol (ROBAXIN) IV Stopped (03/11/20 0747)    Assessment:60 year old female with no significant past medical history with worsening back pain over a week. 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  1. No motor or sensory deficits on serial neurological exams. Severe back pain centered at  midthoracic and lower lumbar levels corresponds to the approximate locations of the probable hemorrhage seen within the spinal canal intradurally at these levels on MRI.  2. Imaging-MRI of the thoracolumbar spine with central cord hyperintensity at T4-5 level without mass lesion. Differential now essentially cord edema secondary to adjacent irritative effects of intradural-extramedullary blood, versus artifact.   3. The patient is doing well following spinal angiography.   Recommendations: - Continue pain management per Hospitalist team. - Initially was on antibiotic coverage for possible spinal abscess. Imaging as above indicates ruptured pseudoaneurysm as the etiology for her presentation. Ceftriaxone has been discontinued.  -.Discussed the case with [facility], who have agreed to take the patient in transfer for possible aneurysm clipping procedure. Beds are currently full at [facility] but one will be made available. [facility] transfer center is in communication with [facility].  - Continue to closely monitor with frequent neuro checks.  - The patient and her husband were updated after the procedure. Total time spent with education regarding her condition: 25 minutes.  - Phone number for the [facility] transfer center: [phone]    LOS: 2 days   @Electronically  signed: Dr. Kerney Elbe 03/11/2020  4:36 PM

## 2020-03-11 NOTE — Progress Notes (Signed)
Report was called to a nurse Didi at [facility] . Foley was removed, Robaxin was administered 30 min, prior to arrival of [facility] and IV Dilaudid was administered on their arrival. [facility]. transported Pt to [facility], the two IVs were left in place

## 2020-03-11 NOTE — Discharge Summary (Signed)
Physician Discharge Summary  Taylor Sharp [id number] DOB: 09-13-1960 DOA: 03/09/2020  PCP: Lawerance Cruel, MD  Admit date: 03/09/2020 Discharge date: 03/11/2020  Time spent: 35 minutes  Recommendations for Outpatient Follow-up:  1. 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 -Over a week ago ate some chicken following which developed diarrhea, had some nausea and mild vomiting at the time, felt to be viral, remembers diarrhea as being pretty profuse, which subsequently resolved, following this developed mid back pain which progressively worsened, she was seen in the ER on 12/27 this is felt to be musculoskeletal and discharged home on pain meds. 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  In addition there was a complex cystic mass at the sacral canal at S1-S2.   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 Earleen Newport and Neurologist Dr.Lindzen in consultation,  -CTA chest/thoracic region yesterday was concerning for small spinal AVM with associated intradural hemorrhage. -underwent Spinal angiogram today which revealed  dissecting pseudoaneurysm of right posterior spinal artery, Neurologist Called [facility] to request transfer and was accepted for possible aneurysm clipping. -she has no neurological deficits    Procedure: Dr.Wagner Neuro IR 1/1 60 yo female with acute back pain/neck pain with hemorrhage at T4-T5 level, associated with cord T2 signal abnormality.  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   Image is T7 right segmental artery injection, 7degree left anterior oblique.   Discharge Exam: Vitals:   03/11/20 1615 03/11/20 1715  BP: 134/76 133/78  Pulse: 84 83  Resp: 18 16  Temp: 98.6 F (37 C) 98.4 F (36.9 C)  SpO2: 97% 96%    General: AAOx3  Cardiovascular: S1S2/RRR Respiratory: CTAB  Discharge Instructions    Allergies as of 03/11/2020      Reactions   Codeine    "MAKES ME CRAZY"   Prednisone    "MAKES ME KIND OF CRAZY"       Allergies  Allergen Reactions  . Codeine     "MAKES ME CRAZY"  . Prednisone     "MAKES ME KIND OF CRAZY"      The results of significant diagnostics from this hospitalization (including imaging, microbiology, ancillary and laboratory) are listed below for reference.    Significant Diagnostic Studies: CT ANGIO CHEST PE W OR WO CONTRAST  Result Date: 03/10/2020 CLINICAL DATA:  60 year old female with abnormal thoracic spinal cord and subarachnoid space on MRI. 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 EXAM: CT ANGIOGRAPHY CHEST WITH CONTRAST TECHNIQUE: Multidetector CT imaging of the chest was performed using the standard protocol during bolus administration of intravenous contrast. Multiplanar CT image reconstructions and MIPs were obtained to evaluate the vascular anatomy. CONTRAST:  176mL OMNIPAQUE IOHEXOL 350 MG/ML SOLN COMPARISON:  MRI head and cervical spine earlier today,  thoracic and lumbar spine yesterday. CT Abdomen and Pelvis yesterday. FINDINGS: Thoracic spine: Stable thoracic vertebral height and alignment. There is mild dextroconvex scoliosis. No acute osseous abnormality identified. On precontrast images there is subtle increased density in the dorsal spinal canal at the T4-T5 level (series 14, image 36) which on MRI corresponds to an area of intermediate to increased T1 signal, dark T2 and STIR signal. Notably, there is no evidence of macroscopic fat density within the ventral spinal canal nearby at the T5-T6 level. Following contrast there does appear to be mild enhancement of the dorsal T5 region (series 10, image 61) which is more apparent on sagittal MIP (series 12, image 61). And furthermore a small nodular or curvilinear enhancing structure located just deep to the right T5 pedicle can be identified on this exam (series 6, image 49 and series 12, image 59) as well as the postcontrast MRI (series 32, image 9 of that exam). However, there do not appear to be abnormally enlarged thoracic paraspinal vessels, and no abnormally numerous or enlarged vessels entering or leaving the spinal canal can be identified. Cardiovascular: Negative thoracic aorta. Central pulmonary arteries also appear to be enhancing and patent. No cardiomegaly or pericardial effusion. No calcified coronary artery atherosclerosis is evident. Mediastinum/Nodes: Negative.  No lymphadenopathy. Lungs/Pleura: Major airways are patent. Lungs are clear aside from minor dependent atelectasis. No pleural effusion. Upper Abdomen: By carious excretion of contrast to the gallbladder since the CT Abdomen and Pelvis yesterday. Otherwise stable, negative visible upper abdominal viscera. Musculoskeletal: No acute osseous abnormality identified. Review of the MIP images confirms the above findings. IMPRESSION: 1. Subtle increased density in the dorsal thoracic spinal canal at T4-T5 which does mildly enhance following  contrast administration, along with a small nodular or curvilinear focus of enhancement just inside the right T5 pedicle, which is also identified on the postcontrast MRI yesterday. No clustered or abnormal paraspinal vessels are identified. No CT evidence of intraspinal lipoma. 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 2. Otherwise negative CTA appearance of the chest. Electronically Signed   By: Genevie Ann M.D.   On: 03/10/2020 12:50   MR BRAIN W WO CONTRAST  Addendum Date: 03/10/2020   ADDENDUM REPORT: 03/10/2020 10:20 ADDENDUM: Study discussed by telephone with Dr. Cheral Marker on 03/10/2020 at 10:13, and also reviewed with Dr. Corrie Mckusick NIR. We discussed that the FLAIR subarachnoid signal around the brain and the possible leptomeningeal enhancement along the cervical spinal cord and cervicomedullary junction could be artifactual (3 Tesla imaging). Furthermore, we reverse viewed the thoracic spine MRI from yesterday, and that study demonstrates abnormal INTRINSIC T1 hyperintensity in the subarachnoid space along the abnormal segment of spinal cord more so than abnormal enhancement. Furthermore, the axial GRE appearance to me most resembles blood products around the spinal cord. So at this time noninvasive CTA Thoracic Spine (using chest CTA aortic timing protocol) will be pursued prior to conventional spinal angiogram. CSF analysis could still be valuable although I am now more suspicious of a vascular/hemorrhage etiology than an inflammatory or infectious process. Electronically  Signed   By: Odessa Fleming M.D.   On: 03/10/2020 10:20   Result Date: 03/10/2020 CLINICAL DATA:  60 year old female with back pain, difficulty walking. Abnormal signal and enhancement In the upper thoracic spinal cord, and intraspinal cystic lesions at the sacrum thought to be unrelated. Indeterminate for thoracic transverse  myelitis versus spinal vascular malformation. Query evidence of CNS infection. EXAM: MRI HEAD WITHOUT AND WITH CONTRAST MRI CERVICAL SPINE WITHOUT AND WITH CONTRAST TECHNIQUE: Multiplanar, multiecho pulse sequences of the brain and surrounding structures, and cervical spine, to include the craniocervical junction and cervicothoracic junction, were obtained without and with intravenous contrast. CONTRAST:  77mL GADAVIST GADOBUTROL 1 MMOL/ML IV SOLN COMPARISON:  Thoracic and lumbar MRI without and with contrast yesterday. FINDINGS: MRI HEAD FINDINGS Brain: Bilateral choroid plexus cysts, normal variant. Normal cerebral volume. No restricted diffusion to suggest acute infarction. No midline shift, mass effect, evidence of mass lesion, ventriculomegaly, extra-axial collection or acute intracranial hemorrhage. Cervicomedullary junction and pituitary are within normal limits. There is no ventriculomegaly or layering intraventricular debris. There is questionable incomplete suppression of CSF in some of the bilateral cerebral sulci on FLAIR imaging (series 8, image 15, 20). However, this is not apparent on sagittal FLAIR imaging (series 12). This is not associated with abnormal pachymeningeal or leptomeningeal enhancement. And underlying cerebral cortex signal appears to remain normal. There is scattered nonspecific mostly subcortical white matter small foci of T2 and FLAIR hyperintensity in both hemispheres, moderate for age. No abnormal parenchymal enhancement. No chronic cerebral blood products. Deep gray matter nuclei remain within normal limits, but there is possible abnormal T2 hyperintensity in the periaqueductal gray matter (series 7, image 12). Mamillary bodies appear normal. Possible trace abnormal FLAIR signal in the dorsal pons on series 8, image 8, but elsewhere brainstem signal appears normal. Cerebellum within normal limits. Vascular: Major intracranial vascular flow voids are preserved. The major dural  venous sinuses are enhancing and appear to be patent. Skull and upper cervical spine: Cervical spine detailed below. Visualized bone marrow signal is within normal limits. Sinuses/Orbits: Negative orbits. Trace paranasal sinus mucosal thickening. Other: Grossly normal visible internal auditory structures. Mastoids are clear. Scalp and face soft tissues appear negative. MRI CERVICAL SPINE FINDINGS Alignment: Relatively preserved cervical lordosis. Vertebrae: No marrow edema or evidence of acute osseous abnormality. Some degenerative endplate marrow signal changes but background bone marrow signal is normal. Cord: Unfortunate motion artifact on sagittal T2 and stir sequences. And similar motion on axial T2 and GRE. No definite abnormal signal in the substance of the cervical spinal cord. No cord expansion. 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 Furthermore, the sagittal cervical spine postcontrast images raise the possibility of cerebellar folia leptomeningeal enhancement also (same image). No other spinal dural thickening. Posterior Fossa, vertebral arteries, paraspinal tissues: No definite signal abnormality within the substance of the cervicomedullary junction. Intracranial details above. Preserved major vascular flow voids in the neck. Negative visible neck soft tissues. Disc levels: Ordinary cervical spine disc and endplate degeneration from C4-C5 through C6-C7 without significant spinal stenosis. There is mild to moderate bilateral C6 neural foraminal stenosis. IMPRESSION: 1. 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 Although the dedicated brain postcontrast imaging does not suggest  widespread intracranial leptomeningeal enhancement. 2. The substance of the cervical spinal  cord appears to remain normal, although detail is suboptimal due to motion on sagittal and axial images. 3. Questionable incomplete CSF suppression along the convexities on FLAIR imaging. Moderate for age nonspecific mostly subcortical white matter T2 hyperintensities, and questionable abnormal signal in the periaqueductal gray matter. No abnormal brain diffusion or parenchymal enhancement. Optic nerves appear to remain normal. 4. 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 Also a spinal meningeal infection or metastatic disease is difficult to exclude. Other diagnoses considered but felt unlikely include Wernicke Encephalopathy. CSF analysis would be valuable in this case. Electronically Signed: By: Genevie Ann M.D. On: 03/10/2020 09:56   MR CERVICAL SPINE W WO CONTRAST  Addendum Date: 03/10/2020   ADDENDUM REPORT: 03/10/2020 10:20 ADDENDUM: Study discussed by telephone with Dr. Cheral Marker on 03/10/2020 at 10:13, and also reviewed with Dr. Corrie Mckusick NIR. We discussed that the FLAIR subarachnoid signal around the brain and the possible leptomeningeal enhancement along the cervical spinal cord and cervicomedullary junction could be artifactual (3 Tesla imaging). Furthermore, we reverse viewed the thoracic spine MRI from yesterday, and that study demonstrates abnormal INTRINSIC T1 hyperintensity in the subarachnoid space along the abnormal segment of spinal cord more so than abnormal enhancement. Furthermore, the axial GRE appearance to me most resembles blood products around the spinal cord. So at this time noninvasive CTA Thoracic Spine (using chest CTA aortic timing protocol) will be pursued prior to conventional spinal angiogram. CSF analysis could still be valuable although I am now more suspicious of a vascular/hemorrhage etiology than an inflammatory or infectious  process. Electronically Signed   By: Genevie Ann M.D.   On: 03/10/2020 10:20   Result Date: 03/10/2020 CLINICAL DATA:  60 year old female with back pain, difficulty walking. Abnormal signal and enhancement In the upper thoracic spinal cord, and intraspinal cystic lesions at the sacrum thought to be unrelated. Indeterminate for thoracic transverse myelitis versus spinal vascular malformation. Query evidence of CNS infection. EXAM: MRI HEAD WITHOUT AND WITH CONTRAST MRI CERVICAL SPINE WITHOUT AND WITH CONTRAST TECHNIQUE: Multiplanar, multiecho pulse sequences of the brain and surrounding structures, and cervical spine, to include the craniocervical junction and cervicothoracic junction, were obtained without and with intravenous contrast. CONTRAST:  49mL GADAVIST GADOBUTROL 1 MMOL/ML IV SOLN COMPARISON:  Thoracic and lumbar MRI without and with contrast yesterday. FINDINGS: MRI HEAD FINDINGS Brain: Bilateral choroid plexus cysts, normal variant. Normal cerebral volume. No restricted diffusion to suggest acute infarction. No midline shift, mass effect, evidence of mass lesion, ventriculomegaly, extra-axial collection or acute intracranial hemorrhage. Cervicomedullary junction and pituitary are within normal limits. There is no ventriculomegaly or layering intraventricular debris. There is questionable incomplete suppression of CSF in some of the bilateral cerebral sulci on FLAIR imaging (series 8, image 15, 20). However, this is not apparent on sagittal FLAIR imaging (series 12). This is not associated with abnormal pachymeningeal or leptomeningeal enhancement. And underlying cerebral cortex signal appears to remain normal. There is scattered nonspecific mostly subcortical white matter small foci of T2 and FLAIR hyperintensity in both hemispheres, moderate for age. No abnormal parenchymal enhancement. No chronic cerebral blood products. Deep gray matter nuclei remain within normal limits, but there is possible abnormal  T2 hyperintensity in the periaqueductal gray matter (series 7, image 12). Mamillary bodies appear normal. Possible trace abnormal FLAIR signal in the dorsal pons on series 8, image 8, but elsewhere brainstem signal appears normal. Cerebellum within normal limits. Vascular: Major intracranial vascular flow voids are preserved. The major dural  venous sinuses are enhancing and appear to be patent. Skull and upper cervical spine: Cervical spine detailed below. Visualized bone marrow signal is within normal limits. Sinuses/Orbits: Negative orbits. Trace paranasal sinus mucosal thickening. Other: Grossly normal visible internal auditory structures. Mastoids are clear. Scalp and face soft tissues appear negative. MRI CERVICAL SPINE FINDINGS Alignment: Relatively preserved cervical lordosis. Vertebrae: No marrow edema or evidence of acute osseous abnormality. Some degenerative endplate marrow signal changes but background bone marrow signal is normal. Cord: Unfortunate motion artifact on sagittal T2 and stir sequences. And similar motion on axial T2 and GRE. No definite abnormal signal in the substance of the cervical spinal cord. No cord expansion. 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 Furthermore, the sagittal cervical spine postcontrast images raise the possibility of cerebellar folia leptomeningeal enhancement also (same image). No other spinal dural thickening. Posterior Fossa, vertebral arteries, paraspinal tissues: No definite signal abnormality within the substance of the cervicomedullary junction. Intracranial details above. Preserved major vascular flow voids in the neck. Negative visible neck soft tissues. Disc levels: Ordinary cervical spine disc and endplate degeneration from C4-C5  through C6-C7 without significant spinal stenosis. There is mild to moderate bilateral C6 neural foraminal stenosis. IMPRESSION: 1. 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 Although the dedicated brain postcontrast imaging does not suggest widespread intracranial leptomeningeal enhancement. 2. The substance of the cervical spinal cord appears to remain normal, although detail is suboptimal due to motion on sagittal and axial images. 3. Questionable incomplete CSF suppression along the convexities on FLAIR imaging. Moderate for age nonspecific mostly subcortical white matter T2 hyperintensities, and questionable abnormal signal in the periaqueductal gray matter. No abnormal brain diffusion or parenchymal enhancement. Optic nerves appear to remain normal. 4. 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 Also a spinal meningeal infection or metastatic disease is difficult to exclude. Other diagnoses considered but felt unlikely include Wernicke Encephalopathy. CSF analysis would be valuable in this case. Electronically Signed: By: Genevie Ann M.D. On: 03/10/2020 09:56   MR THORACIC SPINE W WO CONTRAST  Result Date: 03/09/2020 CLINICAL DATA:  Back pain several days. Additionally upper back pain now lower back pain. No fever. Negative for leukocytosis. No weakness identified. EXAM: MRI THORACIC AND LUMBAR SPINE WITHOUT AND WITH CONTRAST TECHNIQUE: Multiplanar and multiecho pulse sequences of the thoracic and lumbar spine were obtained without and with intravenous contrast. CONTRAST:  6.98mL GADAVIST GADOBUTROL 1 MMOL/ML IV SOLN COMPARISON:  CT abdomen pelvis 03/09/2020 FINDINGS: MRI THORACIC SPINE FINDINGS Alignment:  Normal Vertebrae: Negative for fracture. No evidence of discitis or osteomyelitis. No abnormal enhancement in the vertebra. Cord: Cord hyperintensity  centrally at the T4 and T5 level without expansion or abnormal enhancement. There is fatty infiltration in the dura on the right at T3-4 and on the right at T6 and T7. This is relatively mild and not causing cord compression. There are prominent areas of decreased signal within the subarachnoid space posterior to the cord in the thoracic spine. This raises the possibility of enlarged vessels from a vascular malformation however they do not seem to enhance postcontrast administration. Paraspinal and other soft tissues: No paraspinous mass or fluid collection Disc levels: Mild disc degeneration throughout the thoracic spine with mild disc degeneration but no  disc protrusion. MRI LUMBAR SPINE FINDINGS Segmentation:  Normal Alignment:  Normal Vertebrae:  Normal.  No evidence of discitis osteomyelitis. Conus medullaris: Extends to the L1-2 level and appears normal. Cauda equina appears normal in the lumbar spine. Paraspinal and other soft tissues: Negative for paraspinous mass, adenopathy, or fluid collection Disc levels: Mild lumbar disc degeneration without disc protrusion or stenosis. Complex cystic mass in the sacral canal at the S1 and S2 level. Multilocular cysts are present which show mild increased signal on T1 and decreased signal on T2 but no enhancement postcontrast administration. This fills the canal without bony destruction. IMPRESSION: 1. Central cord hyperintensity at the T4-T5 level without mass lesion. Possible transverse myelitis. Also possible is a dural vascular malformation. There are possible dilated vessels around the spinal cord in the subarachnoid space in the thoracic spine however these could be CSF flow related artifact. 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 2. Complex cystic mass in the sacral canal at S1 and S2 without abnormal enhancement. Favor a benign process such as epidermoid cyst. 3. No MRI evidence of infection in the thoracic  or lumbar spine. 4. These results were called by telephone at the time of interpretation on 03/09/2020 at 3:37 pm to provider [facility] , who verbally acknowledged these results. Electronically Signed   By: Franchot Gallo M.D.   On: 03/09/2020 15:40   MR Lumbar Spine W Wo Contrast  Result Date: 03/09/2020 CLINICAL DATA:  Back pain several days. Additionally upper back pain now lower back pain. No fever. Negative for leukocytosis. No weakness identified. EXAM: MRI THORACIC AND LUMBAR SPINE WITHOUT AND WITH CONTRAST TECHNIQUE: Multiplanar and multiecho pulse sequences of the thoracic and lumbar spine were obtained without and with intravenous contrast. CONTRAST:  6.43mL GADAVIST GADOBUTROL 1 MMOL/ML IV SOLN COMPARISON:  CT abdomen pelvis 03/09/2020 FINDINGS: MRI THORACIC SPINE FINDINGS Alignment:  Normal Vertebrae: Negative for fracture. No evidence of discitis or osteomyelitis. No abnormal enhancement in the vertebra. Cord: Cord hyperintensity centrally at the T4 and T5 level without expansion or abnormal enhancement. There is fatty infiltration in the dura on the right at T3-4 and on the right at T6 and T7. This is relatively mild and not causing cord compression. There are prominent areas of decreased signal within the subarachnoid space posterior to the cord in the thoracic spine. This raises the possibility of enlarged vessels from a vascular malformation however they do not seem to enhance postcontrast administration. Paraspinal and other soft tissues: No paraspinous mass or fluid collection Disc levels: Mild disc degeneration throughout the thoracic spine with mild disc degeneration but no disc protrusion. MRI LUMBAR SPINE FINDINGS Segmentation:  Normal Alignment:  Normal Vertebrae:  Normal.  No evidence of discitis osteomyelitis. Conus medullaris: Extends to the L1-2 level and appears normal. Cauda equina appears normal in the lumbar spine. Paraspinal and other soft tissues: Negative for paraspinous  mass, adenopathy, or fluid collection Disc levels: Mild lumbar disc degeneration without disc protrusion or stenosis. Complex cystic mass in the sacral canal at the S1 and S2 level. Multilocular cysts are present which show mild increased signal on T1 and decreased signal on T2 but no enhancement postcontrast administration. This fills the canal without bony destruction. IMPRESSION: 1. Central cord hyperintensity at the T4-T5 level without mass lesion. Possible transverse myelitis. Also possible is a dural vascular malformation. There are possible dilated vessels around the spinal cord in the subarachnoid space in the thoracic spine  however these could be CSF flow related artifact. 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 2. Complex cystic mass in the sacral canal at S1 and S2 without abnormal enhancement. Favor a benign process such as epidermoid cyst. 3. No MRI evidence of infection in the thoracic or lumbar spine. 4. These results were called by telephone at the time of interpretation on 03/09/2020 at 3:37 pm to provider [facility] , who verbally acknowledged these results. Electronically Signed   By: Franchot Gallo M.D.   On: 03/09/2020 15:40   CT Abdomen Pelvis W Contrast  Result Date: 03/09/2020 CLINICAL DATA:  Back pain for several days EXAM: CT ABDOMEN AND PELVIS WITH CONTRAST TECHNIQUE: Multidetector CT imaging of the abdomen and pelvis was performed using the standard protocol following bolus administration of intravenous contrast. CONTRAST:  170mL OMNIPAQUE IOHEXOL 300 MG/ML  SOLN COMPARISON:  None. FINDINGS: Lower chest: No acute abnormality. Hepatobiliary: No focal liver abnormality is seen. No gallstones, gallbladder wall thickening, or biliary dilatation. Pancreas: Unremarkable. No pancreatic ductal dilatation or surrounding inflammatory changes. Spleen: Normal in size without focal abnormality. Adrenals/Urinary Tract: Adrenal glands are  unremarkable. Kidneys are normal, without renal calculi, focal lesion, or hydronephrosis. Bladder is unremarkable. Stomach/Bowel: Stomach is within normal limits. Appendix appears normal. No evidence of bowel wall thickening, distention, or inflammatory changes. Vascular/Lymphatic: No significant vascular findings are present. No enlarged abdominal or pelvic lymph nodes. Reproductive: Uterus and bilateral adnexa are unremarkable. Other: No abdominal wall hernia or abnormality. No abdominopelvic ascites. Musculoskeletal: No acute or significant osseous findings. IMPRESSION: 1. No acute abdominal or pelvic pathology. Electronically Signed   By: Kathreen Devoid   On: 03/09/2020 12:53    Microbiology: Recent Results (from the past 240 hour(s))  Resp Panel by RT-PCR (Flu A&B, Covid) Nasopharyngeal Swab     Status: None   Collection Time: 03/05/20  7:06 PM   Specimen: Nasopharyngeal Swab; Nasopharyngeal(NP) swabs in vial transport medium  Result Value Ref Range Status   SARS Coronavirus 2 by RT PCR NEGATIVE NEGATIVE Final    Comment: (NOTE) SARS-CoV-2 target nucleic acids are NOT DETECTED.  The SARS-CoV-2 RNA is generally detectable in upper respiratory specimens during the acute phase of infection. The lowest concentration of SARS-CoV-2 viral copies this assay can detect is 138 copies/mL. A negative result does not preclude SARS-Cov-2 infection and should not be used as the sole basis for treatment or other patient management decisions. A negative result may occur with  improper specimen collection/handling, submission of specimen other than nasopharyngeal swab, presence of viral mutation(s) within the areas targeted by this assay, and inadequate number of viral copies(<138 copies/mL). A negative result must be combined with clinical observations, patient history, and epidemiological information. The expected result is Negative.  Fact Sheet for Patients:   EntrepreneurPulse.com.au  Fact Sheet for Healthcare Providers:  IncredibleEmployment.be  This test is no t yet approved or cleared by the [place] FDA and  has been authorized for detection and/or diagnosis of SARS-CoV-2 by FDA under an Emergency Use Authorization (EUA). This EUA will remain  in effect (meaning this test can be used) for the duration of the COVID-19 declaration under Section 564(b)(1) of the Act, 21 U.S.C.section 360bbb-3(b)(1), unless the authorization is terminated  or revoked sooner.       Influenza A by PCR NEGATIVE NEGATIVE Final   Influenza B by PCR NEGATIVE NEGATIVE Final    Comment: (NOTE) The Xpert Xpress SARS-CoV-2/FLU/RSV plus assay  is intended as an aid in the diagnosis of influenza from Nasopharyngeal swab specimens and should not be used as a sole basis for treatment. Nasal washings and aspirates are unacceptable for Xpert Xpress SARS-CoV-2/FLU/RSV testing.  Fact Sheet for Patients: EntrepreneurPulse.com.au  Fact Sheet for Healthcare Providers: IncredibleEmployment.be  This test is not yet approved or cleared by the [place] FDA and has been authorized for detection and/or diagnosis of SARS-CoV-2 by FDA under an Emergency Use Authorization (EUA). This EUA will remain in effect (meaning this test can be used) for the duration of the COVID-19 declaration under Section 564(b)(1) of the Act, 21 U.S.C. section 360bbb-3(b)(1), unless the authorization is terminated or revoked.  Performed at [facility], [address] [place], [place] [address]   Culture, blood (routine x 2)     Status: None (Preliminary result)   Collection Time: 03/09/20 12:01 PM   Specimen: Left Antecubital; Blood  Result Value Ref Range Status   Specimen Description   Final    LEFT ANTECUBITAL Performed at [facility] [address], [place], [place]  [address]    Special Requests   Final    BOTTLES DRAWN AEROBIC ONLY Blood Culture adequate volume Performed at [facility] [address], [place], [place] [address]    Culture   Final    NO GROWTH 2 DAYS Performed at [facility] [address], [place], [place] [address]    Report Status PENDING  Incomplete  Culture, blood (routine x 2)     Status: None (Preliminary result)   Collection Time: 03/09/20 12:06 PM   Specimen: Right Antecubital; Blood  Result Value Ref Range Status   Specimen Description   Final    RIGHT ANTECUBITAL Performed at [facility] [address], [place], [place] [address]    Special Requests   Final    BOTTLES DRAWN AEROBIC AND ANAEROBIC Blood Culture adequate volume Performed at [facility] [address], [place], [place] [address]    Culture   Final    NO GROWTH 2 DAYS Performed at [facility] [address] [place],  [address]    Report Status PENDING  Incomplete  SARS CORONAVIRUS 2 (TAT 6-24 HRS) Nasopharyngeal Nasopharyngeal Swab     Status: None   Collection Time: 03/09/20  5:47 PM   Specimen: Nasopharyngeal Swab  Result Value Ref Range Status   SARS Coronavirus 2 NEGATIVE NEGATIVE Final    Comment: (NOTE) SARS-CoV-2 target nucleic acids are NOT DETECTED.  The SARS-CoV-2 RNA is generally detectable in upper and lower respiratory specimens during the acute phase of infection. Negative results do not preclude SARS-CoV-2 infection, do not rule out co-infections with other pathogens, and should not be used as the sole basis for treatment or other patient management decisions. Negative results must be combined with clinical observations, patient history, and epidemiological information. The expected result is Negative.  Fact Sheet for Patients: SugarRoll.be  Fact Sheet for Healthcare  Providers: https://www.woods-mathews.com/  This test is not yet approved or cleared by the [place] FDA and  has been authorized for detection and/or diagnosis of SARS-CoV-2 by FDA under an Emergency Use Authorization (EUA). This EUA will remain  in effect (meaning this test can be used) for the duration of the COVID-19 declaration under Se ction 564(b)(1) of the Act, 21 U.S.C. section 360bbb-3(b)(1), unless the authorization is terminated or revoked sooner.  Performed at [facility], [address] [place], [place] [address]   MRSA PCR Screening     Status: None   Collection Time: 03/11/20  9:18 AM   Specimen: Nasopharyngeal  Result Value Ref Range Status   MRSA by PCR NEGATIVE NEGATIVE Final    Comment:        The GeneXpert MRSA Assay (FDA approved for NASAL specimens only), is one component of a comprehensive MRSA colonization surveillance program. It is not intended to diagnose MRSA infection nor to guide or monitor treatment for MRSA infections. Performed at [facility], [place] [address] [place], [place] [address]      Labs: Basic Metabolic Panel: Recent Labs  Lab 03/05/20 1906 03/06/20 0936 03/06/20 1445 03/09/20 1110 03/11/20 0158  NA 135 138  --  140 139  K 3.0* 3.5  --  3.7 3.7  CL 101 106  --  107 108  CO2 22 20*  --  20* 21*  GLUCOSE 170* 133*  --  106* 102*  BUN 10 7  --  11 <5*  CREATININE 0.77 0.80  --  0.84 0.67  CALCIUM 9.0 8.9  --  9.5 8.6*  MG  --   --  2.0  --   --    Liver Function Tests: Recent Labs  Lab 03/05/20 1906 03/06/20 0936 03/09/20 1110  AST 24 21 22   ALT 15 15 20   ALKPHOS 71 67 67  BILITOT 1.0 1.1 1.9*  PROT 6.9 6.3* 7.5  ALBUMIN 4.1 3.7 4.4   Recent Labs  Lab 03/05/20 1906 03/09/20 1110  LIPASE 34 26   No results for input(s): AMMONIA in the last 168 hours. CBC: Recent Labs  Lab 03/05/20 1906 03/06/20 0936 03/09/20 1110 03/11/20 0158  WBC 6.6 8.3 10.0 8.4  NEUTROABS  --   --   6.6  --   HGB 14.3 14.3 14.7 12.1  HCT 42.4 41.2 41.9 36.7  MCV 89.6 88.2 88.2 90.0  PLT 276 295 338 249   Cardiac Enzymes: Recent Labs  Lab 03/09/20 1110  CKTOTAL 136   BNP: BNP (last 3 results) No results for input(s): BNP in the last 8760 hours.  ProBNP (last 3 results) No results for input(s): PROBNP in the last 8760 hours.  CBG: No results for input(s): GLUCAP in the last 168 hours.     Signed:  Domenic Polite MD.  [facility] 03/11/2020, 5:41 PM

## 2020-03-11 NOTE — Anesthesia Postprocedure Evaluation (Signed)
Anesthesia Post Note  Patient: Taylor Sharp  Procedure(s) Performed: IR WITH ANESTHESIA (N/A )     Patient location during evaluation: PACU Anesthesia Type: General Level of consciousness: awake and alert Pain management: pain level controlled Vital Signs Assessment: post-procedure vital signs reviewed and stable Respiratory status: spontaneous breathing, nonlabored ventilation, respiratory function stable and patient connected to nasal cannula oxygen Cardiovascular status: blood pressure returned to baseline and stable Postop Assessment: no apparent nausea or vomiting Anesthetic complications: no   No complications documented.  Last Vitals:  Vitals:   03/11/20 1815 03/11/20 1957  BP: 140/71 (!) 143/77  Pulse: 90 84  Resp: 18 16  Temp: 36.8 C 36.7 C  SpO2: 98% 98%    Last Pain:  Vitals:   03/11/20 1957  TempSrc: Oral  PainSc:                  Cecile Hearing

## 2020-03-11 NOTE — Sedation Documentation (Signed)
Sheath removed and closed with 5 fr Celt.

## 2020-03-11 NOTE — Procedures (Addendum)
Neuro-Interventional Radiology  Post Spinal Angiogram Procedure Note  Operator:    Dr. Loreta Ave Assistant:   None  History:   60 yo female with acute back pain/neck pain with hemorrhage at T4-T5 level, associated with cord T2 signal abnormality.  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   Image is T7 right segmental artery injection, 7degree left anterior oblique.    Anesthesia:   GETA  EBL:    minimal     Complication:  None   Recommendations: - right hip straight for 1 hr.  Celt device has indication for immediate ambulation, when able.  - Goal SBP normal range - adequate pain control for SBP control - Discussed with Dr. Otelia Limes of Neurology  Signed,  Yvone Neu. Loreta Ave, DO

## 2020-03-13 ENCOUNTER — Encounter (HOSPITAL_COMMUNITY): Payer: Self-pay | Admitting: Radiology

## 2020-03-13 MED FILL — Ondansetron HCl Inj 4 MG/2ML (2 MG/ML): INTRAMUSCULAR | Qty: 2 | Status: AC

## 2020-03-13 MED FILL — Fentanyl Citrate Preservative Free (PF) Inj 100 MCG/2ML: INTRAMUSCULAR | Qty: 2 | Status: AC

## 2020-03-14 LAB — CULTURE, BLOOD (ROUTINE X 2)
Culture: NO GROWTH
Culture: NO GROWTH
Special Requests: ADEQUATE
Special Requests: ADEQUATE

## 2020-03-15 ENCOUNTER — Other Ambulatory Visit (HOSPITAL_COMMUNITY): Payer: Self-pay | Admitting: Neurology

## 2020-03-15 ENCOUNTER — Encounter (HOSPITAL_COMMUNITY): Payer: Self-pay

## 2020-03-15 DIAGNOSIS — I770 Arteriovenous fistula, acquired: Secondary | ICD-10-CM

## 2020-05-09 ENCOUNTER — Encounter: Payer: Self-pay | Admitting: Physical Therapy

## 2020-05-09 ENCOUNTER — Ambulatory Visit: Payer: 59 | Attending: Neurosurgery | Admitting: Physical Therapy

## 2020-05-09 ENCOUNTER — Other Ambulatory Visit: Payer: Self-pay

## 2020-05-09 DIAGNOSIS — M6281 Muscle weakness (generalized): Secondary | ICD-10-CM | POA: Insufficient documentation

## 2020-05-09 DIAGNOSIS — M62830 Muscle spasm of back: Secondary | ICD-10-CM | POA: Diagnosis present

## 2020-05-09 DIAGNOSIS — M5441 Lumbago with sciatica, right side: Secondary | ICD-10-CM | POA: Insufficient documentation

## 2020-05-09 DIAGNOSIS — R262 Difficulty in walking, not elsewhere classified: Secondary | ICD-10-CM | POA: Insufficient documentation

## 2020-05-09 NOTE — Therapy (Signed)
[facility] [address], [place], [place], [address] Phone: [phone]   Fax:  [phone]  Physical Therapy Evaluation  Patient Details  Name: Taylor Sharp MRN: [id number] Date of Birth: 03-Jan-1961 Referring Provider (PT): Dr. Nikki Dom   Encounter Date: 05/09/2020   PT End of Session - 05/09/20 1715    Visit Number 1    Number of Visits 30    Date for PT Re-Evaluation 07/04/20    Authorization Type [employer] 30 visit limit    PT Start Time 0800    PT Stop Time 0845    PT Time Calculation (min) 45 min    Activity Tolerance Patient tolerated treatment well           Past Medical History:  Diagnosis Date  . Asthma     Past Surgical History:  Procedure Laterality Date  . ABLATION    . IR ANGIO/SPINAL LEFT  03/11/2020  . IR ANGIO/SPINAL LEFT  03/11/2020  . IR ANGIO/SPINAL LEFT  03/11/2020  . IR ANGIO/SPINAL LEFT  03/11/2020  . IR ANGIO/SPINAL LEFT  03/11/2020  . IR ANGIO/SPINAL LEFT  03/11/2020  . IR ANGIO/SPINAL LEFT  03/11/2020  . IR ANGIO/SPINAL RIGHT  03/11/2020  . IR ANGIO/SPINAL RIGHT  03/11/2020  . IR ANGIO/SPINAL RIGHT  03/11/2020  . IR ANGIO/SPINAL RIGHT  03/11/2020  . IR ANGIO/SPINAL RIGHT  03/11/2020  . IR ANGIO/SPINAL RIGHT  03/11/2020  . IR ANGIOGRAM SELECTIVE EACH ADDITIONAL VESSEL  03/11/2020  . IR ANGIOGRAM SELECTIVE EACH ADDITIONAL VESSEL  03/11/2020  . IR US GUIDE VASC ACCESS RIGHT  03/11/2020  . RADIOLOGY WITH ANESTHESIA N/A 03/11/2020   Procedure: IR WITH ANESTHESIA;  Surgeon: Radiologist, Medication, MD;  Location: [facility];  Service: Radiology;  Laterality: N/A;    There were no vitals filed for this visit.    Subjective Assessment - 05/09/20 0803    Subjective On  Christmas Eve onset of  intense headache, backache, rash, nausea/vomiting.  No fever.  After further testing, diagnosed by MRI with 2 masses.  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  Also with tumor in S2 causing right LE  nerve pain.  Buttock, medial/posterior thigh to shin.  Worsened as the day goes on.  I've improved a lot now.  I get weak as the day goes on.  Radiates around ribs.  Bedrest 6 weeks with light walking only.  Needed help to go up /down steps.Aneurysm has been absorbed now and no surgery needed for sacral tumor.  Driving some.  From angiogram on right discomfort.    Pertinent History Gabapentin and Robaxin;  no current restrictions;  typically hypermobile; left hip bursitis; osteopenia    Limitations House hold activities;Walking;Lifting    How long can you sit comfortably? < 1 hour    How long can you walk comfortably? 1 mile with husband's support very slowly    Diagnostic tests MRI; angiogram tumor and aneurysm both on right    Patient Stated Goals walk 100# dog; walk further and faster;  get back to work;  later return to tennis; later return to riding horses; keep up with daughter    Currently in Pain? Yes    Pain Score 5     Pain Location Back    Pain Orientation Mid;Right    Pain Type Acute pain    Aggravating Factors  as the day goes on; moving right leg around, night time    Pain Relieving Factors wedge b/w  knees and behind knees;  lying with hands behing sacral region              [facility] PT Assessment - 05/09/20 0001      Assessment   Medical Diagnosis intractable low back pain    Referring Provider (PT) Dr. Nikki Dom    Onset Date/Surgical Date 03/03/20    Next MD Visit 6 months    Prior Therapy for shoulder      Precautions   Precautions None      Restrictions   Weight Bearing Restrictions No      Balance Screen   Has the patient fallen in the past 6 months No    Has the patient had a decrease in activity level because of a fear of falling?  No    Is the patient reluctant to leave their home because of a fear of falling?  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; hopes to work at the [employer] playing tennis; ride horses; spending time with family (daughters and husband)      Observation/Other Assessments   Focus on Therapeutic Outcomes (FOTO)  33% (goal 57%)      Posture/Postural Control   Posture/Postural Control No significant limitations      AROM   Lumbar Flexion finger tips just below knees   minimal lumbar mobility with most movement at the hips and thoracic spine   Lumbar Extension 10    Lumbar - Right Side Bend 15    Lumbar - Left Side Bend 15      Strength   Overall Strength Comments able to rise sit to stand without UE assist 1x but with great difficulty; extensive MMT not performed secondary to high symptom irritability; lacking full ROM with standing heel raises and toe raises on right    Right Ankle Dorsiflexion 4-/5    Right Ankle Plantar Flexion 4-/5    Left Ankle Dorsiflexion 5/5    Left Ankle Plantar Flexion 5/5    Lumbar Flexion 3+/5   decreased activation of transverse abdominus   Lumbar Extension 4-/5      Flexibility   Soft Tissue Assessment /Muscle Length yes   tendency of hypermobility although thoracolumbar fascia restriction and HS restriction secondary to neural component     Palpation   Palpation comment spasm and tender points in thoracic paraspinals      Special Tests   Other special tests + neural signs on right                      Objective measurements completed on examination: See above findings.               PT Education - 05/09/20 1713    Education Details supine transverse abdominus activation+ heel slides; supine UE beats; seated UE beats     Person(s) Educated Patient    Methods Explanation;Demonstration;Handout    Comprehension Returned demonstration;Verbalized understanding  PT Short Term Goals - 05/09/20 1728      PT SHORT TERM GOAL #1   Title The patient will be able to initiate a basic HEP for core and LE strengthening (especially) right LE to facilitate return to ADLs    Time 4    Period Weeks    Status New    Target Date 06/06/20      PT SHORT TERM GOAL #2   Title The patient will report a 30% improvement in back and right LE pain particularly at the end of the day and with household chores including doing the dishwasher    Time 4    Period Weeks    Status New      PT SHORT TERM GOAL #3   Title FOTO functional score improved from 33% to 43% indicating improved function with less pain    Time 4    Period Weeks    Status New      PT SHORT TERM GOAL #4   Title The patient will be able to walk 8-10 minutes unassisted    Time 4    Period Weeks    Status New      PT SHORT TERM GOAL #5   Title The patient will have improved spinal flexion to 60 degrees, extension to 20 degrees and bil sidebending to 20 degrees    Time 4    Period Weeks    Status New             PT Long Term Goals - 05/09/20 1734      PT LONG TERM GOAL #1   Title The patient will be independent with safe self progression of HEP    Time 8    Period Weeks    Status New    Target Date 07/04/20      PT LONG TERM GOAL #2   Title The patient will report a 60% improvement in back and right LE symptoms at the end of the day and with household chores    Time 8    Period Weeks    Status New      PT LONG TERM GOAL #3   Title The patient will have improved right LE and core strength grossly 4+/5 needed for reaching, sit to stand and walking longer distances    Time 8    Period Weeks    Status New      PT LONG TERM GOAL #4   Title The patient will demonstrate improved activity tolerance for sitting, standing and walking  needed to work at the [employer] in April or future return to work at [employer]    Time [place] #5   Title FOTO functional outcome score improved to 57% indicating improved function with less pain    Time 8    Period [place] - 05/09/20 1700    Clinical Impression Statement On  Christmas Eve onset of  intense headache, backache, rash, nausea/vomiting.  No fever.  After further testing, diagnosed by MRI with 2 masses.  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  Also with tumor in S2 causing right LE nerve pain. Initially on bedrest with light walking only allowed.  Recent imaging indicates aneurysm has been absorbed  and sacral tumor will be monitored with no surgery recommended at this time.  Pain in thoracic region radiates around to the front ribs as well as  right buttock, medial/posterior thigh to shin which worsens as the day goes on.  She reports she has improved and was able to walk a mile but her husband has to hold on/support her.  She is able to go up and down the steps unassisted now and can do very light household activities at this time.  She can drive short distances.  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  She has decreased right ankle DF and PF motor control in weight bearing.  +right neural signs  easily provoked with right LE movement.  Decreased activation and strength of lumbo/sacral/hip core muscles grossly 4-/5.  Decreased thoracolumbar fascia noted with forward bending. Thoracic paraspinal tender points and spasm noted.  Her FOTO is 33% indicating extreme difficulty performing usual work and household activities.  She would benefit from PT to address these deficits.    Personal Factors and Comorbidities Comorbidity 1;Profession;Comorbidity 2;Comorbidity 3+;Past/Current Experience     Comorbidities osteopenia; hx of left hip bursitis; anxiety    Examination-Activity Limitations Locomotion Level;Bed Mobility;Reach Overhead;Bend;Caring for Others;Carry;Sleep;Lift;Stand;Hygiene/Grooming;Squat    Examination-Participation Restrictions Meal Prep;Cleaning;Occupation;Community Activity;Driving;Laundry    Stability/Clinical Decision Making Evolving/Moderate complexity    Clinical Decision Making Moderate    Rehab Potential Good    PT Frequency 2x / week    PT Duration 8 weeks    PT Treatment/Interventions ADLs/Self Care Home Management;Aquatic Therapy;Cryotherapy;Electrical Stimulation;Ultrasound;Moist Heat;Iontophoresis 4mg /ml Dexamethasone;Functional mobility training;Therapeutic activities;Therapeutic exercise;Neuromuscular re-education;Manual techniques;Patient/family education;Taping;Dry needling    PT Next Visit Plan High symptom irritability right LE;  Pilates based progression for core muscle activation low level; ES as needed for pain control; aquatic PT;  possible DN to thoracic paraspinals    PT Home Exercise Plan [email or web address]    Consulted and Agree with Plan of Care Patient           Patient will benefit from skilled therapeutic intervention in order to improve the following deficits and impairments:  Decreased range of motion,Difficulty walking,Increased fascial restricitons,Increased muscle spasms,Decreased activity tolerance,Impaired perceived functional ability,Pain,Decreased strength  Visit Diagnosis: Acute right-sided low back pain with right-sided sciatica - Plan: PT plan of care cert/re-cert  Muscle spasm of back - Plan: PT plan of care cert/re-cert  Muscle weakness (generalized) - Plan: PT plan of care cert/re-cert  Difficulty in walking, not elsewhere classified - Plan: PT plan of care cert/re-cert     Problem List Patient Active Problem List   Diagnosis Date Noted  . 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 03/09/2020  . 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 Intractable low back pain 03/09/2020  . Left hip pain 12/10/2018  . Osteopenia 05/11/2018  . Bursitis of hip 02/18/2017  . 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 GERD (gastroesophageal reflux disease) 08/04/2014  . Upper airway cough syndrome 08/04/2014  . Chest discomfort 05/25/2014   Ruben Im, PT 05/09/20 5:41 PM Phone: [phone] Fax: [phone] Taylor Sharp 05/09/2020, 5:40 PM  [facility] [address], [place], [place], [address] Phone: [phone]   Fax:  [phone]  Name: Taylor Sharp MRN: [id number] Date of Birth: 05/19/1960

## 2020-05-09 NOTE — Patient Instructions (Signed)
Access Code: [email or web address] URL: [email or web address] Date: 05/09/2020 Prepared by: Ruben Im  Exercises Hooklying Transversus Abdominis Palpation - 1 x daily - 7 x weekly - 1 sets - 10 reps Supine Transversus Abdominis Bracing with Heel Slide - 1 x daily - 7 x weekly - 1 sets - 10 reps SEATED ARM PULSES - 1 x daily - 7 x weekly - 1 sets - 10 reps

## 2020-05-12 ENCOUNTER — Encounter: Payer: Self-pay | Admitting: Physical Therapy

## 2020-05-12 ENCOUNTER — Ambulatory Visit: Payer: 59 | Admitting: Physical Therapy

## 2020-05-12 ENCOUNTER — Other Ambulatory Visit: Payer: Self-pay

## 2020-05-12 DIAGNOSIS — M5441 Lumbago with sciatica, right side: Secondary | ICD-10-CM | POA: Diagnosis not present

## 2020-05-12 DIAGNOSIS — M6281 Muscle weakness (generalized): Secondary | ICD-10-CM

## 2020-05-12 DIAGNOSIS — R262 Difficulty in walking, not elsewhere classified: Secondary | ICD-10-CM

## 2020-05-12 DIAGNOSIS — M62830 Muscle spasm of back: Secondary | ICD-10-CM

## 2020-05-12 NOTE — Therapy (Signed)
[facility] [address], [place], [place], [address] Phone: [phone]   Fax:  [phone]  Physical Therapy Treatment  Patient Details  Name: Taylor Sharp MRN: [id number] Date of Birth: March 04, 1961 Referring Provider (PT): Dr. Nikki Dom   Encounter Date: 05/12/2020   PT End of Session - 05/12/20 1548    Visit Number 2    Number of Visits 30    Date for PT Re-Evaluation 07/04/20    Authorization Type [employer] 30 visit limit    PT Start Time 1210    PT Stop Time 1250    PT Time Calculation (min) 40 min    Activity Tolerance Patient tolerated treatment well    Behavior During Therapy [facility] for tasks assessed/performed           Past Medical History:  Diagnosis Date  . Asthma     Past Surgical History:  Procedure Laterality Date  . ABLATION    . IR ANGIO/SPINAL LEFT  03/11/2020  . IR ANGIO/SPINAL LEFT  03/11/2020  . IR ANGIO/SPINAL LEFT  03/11/2020  . IR ANGIO/SPINAL LEFT  03/11/2020  . IR ANGIO/SPINAL LEFT  03/11/2020  . IR ANGIO/SPINAL LEFT  03/11/2020  . IR ANGIO/SPINAL LEFT  03/11/2020  . IR ANGIO/SPINAL RIGHT  03/11/2020  . IR ANGIO/SPINAL RIGHT  03/11/2020  . IR ANGIO/SPINAL RIGHT  03/11/2020  . IR ANGIO/SPINAL RIGHT  03/11/2020  . IR ANGIO/SPINAL RIGHT  03/11/2020  . IR ANGIO/SPINAL RIGHT  03/11/2020  . IR ANGIOGRAM SELECTIVE EACH ADDITIONAL VESSEL  03/11/2020  . IR ANGIOGRAM SELECTIVE EACH ADDITIONAL VESSEL  03/11/2020  . IR US GUIDE VASC ACCESS RIGHT  03/11/2020  . RADIOLOGY WITH ANESTHESIA N/A 03/11/2020   Procedure: IR WITH ANESTHESIA;  Surgeon: Radiologist, Medication, MD;  Location: [place];  Service: Radiology;  Laterality: N/A;    There were no vitals filed for this visit.   Subjective Assessment - 05/12/20 1545    Subjective My RT leg has some mild symptoms today, I'm trying to wean off my muscle relaxer.    Pertinent History Gabapentin and Robaxin;  no current restrictions;  typically hypermobile; left hip  bursitis; osteopenia    Patient Stated Goals walk 100# dog; walk further and faster;  get back to work;  later return to tennis; later return to riding horses; keep up with daughter    Currently in Pain? Yes    Pain Score --   RT leg symptoms 2-3/10,   Pain Orientation Right;Lower;Medial    Pain Descriptors / Indicators Burning;Aching    Multiple Pain Sites No          Treatment: Aquatics Patient seen for aquatic therapy today.  Treatment took place in water 3.5-4 feet deep depending upon activity.  Pt entered the pool via stairs with light use of the hand rails. Water temp 90 degrees F.  Seated water bench with 75% submersion Pt performed seated LE AROM exercises 20x in all planes,all with concurrent discussion of her pain/symptoms and current status.  Standing exs performed in upper chest depth: Water walking long length of the pool 4 lengths in each direction.  High knee marching short length of pool 4 lengths. BAck supported on the pool wall for UE weights shoulder add/abd 1 min gentle force only Decompression float to help manage discomfort in her mid back 2-4 min with block noodle. Pt tried 4 lengths of side stroke, "tighter on my RT."  PT Education - 05/12/20 1547    Education Details Water principles, pt has swimming background    Person(s) Educated Patient    Methods Explanation    Comprehension Verbalized understanding            PT Short Term Goals - 05/09/20 1728      PT SHORT TERM GOAL #1   Title The patient will be able to initiate a basic HEP for core and LE strengthening (especially) right LE to facilitate return to ADLs    Time 4    Period Weeks    Status New    Target Date 06/06/20      PT SHORT TERM GOAL #2   Title The patient will report a 30% improvement in back and right LE pain particularly at the end of the day and with household chores including doing the dishwasher    Time 4    Period Weeks     Status New      PT SHORT TERM GOAL #3   Title FOTO functional score improved from 33% to 43% indicating improved function with less pain    Time 4    Period Weeks    Status New      PT SHORT TERM GOAL #4   Title The patient will be able to walk 8-10 minutes unassisted    Time 4    Period Weeks    Status New      PT SHORT TERM GOAL #5   Title The patient will have improved spinal flexion to 60 degrees, extension to 20 degrees and bil sidebending to 20 degrees    Time 4    Period Weeks    Status New             PT Long Term Goals - 05/09/20 1734      PT LONG TERM GOAL #1   Title The patient will be independent with safe self progression of HEP    Time 8    Period Weeks    Status New    Target Date 07/04/20      PT LONG TERM GOAL #2   Title The patient will report a 60% improvement in back and right LE symptoms at the end of the day and with household chores    Time 8    Period Weeks    Status New      PT LONG TERM GOAL #3   Title The patient will have improved right LE and core strength grossly 4+/5 needed for reaching, sit to stand and walking longer distances    Time 8    Period Weeks    Status New      PT LONG TERM GOAL #4   Title The patient will demonstrate improved activity tolerance for sitting, standing and walking needed to work at the [employer] in April or future return to work at [employer]    Time 8    Period Weeks    Status New      PT [place] #5   Title FOTO functional outcome score improved to 57% indicating improved function with less pain    Time 8    Period Weeks    Status New                 Plan - 05/12/20 1548    Clinical Impression Statement Pt arrives for first follow up today. Pt with first aquatics session, pt with aquatic background and reports she  is very eager to get back to "swimming." PTA advised a more gradual approach. Pt only reported mild thoracic "fatigue and a little soreness" about mid way through the  session. Overall pt tolerated mid waist to upper chest depth exercises very well, even trying 2 short lengthe of the sidestroke. Pt found the RT side to be "tight."    Personal Factors and Comorbidities Comorbidity 1;Profession;Comorbidity 2;Comorbidity 3+;Past/Current Experience    Comorbidities osteopenia; hx of left hip bursitis; anxiety    Examination-Activity Limitations Locomotion Level;Bed Mobility;Reach Overhead;Bend;Caring for Others;Carry;Sleep;Lift;Stand;Hygiene/Grooming;Squat    Examination-Participation Restrictions Meal Prep;Cleaning;Occupation;Community Activity;Driving;Laundry    Stability/Clinical Decision Making Evolving/Moderate complexity    Rehab Potential Good    PT Frequency 2x / week    PT Duration 8 weeks    PT Treatment/Interventions ADLs/Self Care Home Management;Aquatic Therapy;Cryotherapy;Electrical Stimulation;Ultrasound;Moist Heat;Iontophoresis 4mg /ml Dexamethasone;Functional mobility training;Therapeutic activities;Therapeutic exercise;Neuromuscular re-education;Manual techniques;Patient/family education;Taping;Dry needling    PT Next Visit Plan High symptom irritability right LE;  Pilates based progression for core muscle activation low level; ES as needed for pain control; aquatic PT;  possible DN to thoracic paraspinals    PT Home Exercise Plan [email or web address]    Consulted and Agree with Plan of Care Patient           Patient will benefit from skilled therapeutic intervention in order to improve the following deficits and impairments:  Decreased range of motion,Difficulty walking,Increased fascial restricitons,Increased muscle spasms,Decreased activity tolerance,Impaired perceived functional ability,Pain,Decreased strength  Visit Diagnosis: Acute right-sided low back pain with right-sided sciatica  Muscle spasm of back  Muscle weakness (generalized)  Difficulty in walking, not elsewhere classified     Problem List Patient Active Problem List   Diagnosis  Date Noted  . 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 03/09/2020  . 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 Intractable low back pain 03/09/2020  . Left hip pain 12/10/2018  . Osteopenia 05/11/2018  . Bursitis of hip 02/18/2017  . 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 GERD (gastroesophageal reflux disease) 08/04/2014  . Upper airway cough syndrome 08/04/2014  . Chest discomfort 05/25/2014    Mindee Robledo, PTA 05/12/2020, 3:53 PM  [facility] [address] [place], [place], [address] Phone: [phone]   Fax:  [phone]  Name: Shawntell Dixson MRN: [id number] Date of Birth: 1961-03-01

## 2020-05-16 ENCOUNTER — Other Ambulatory Visit: Payer: Self-pay

## 2020-05-16 ENCOUNTER — Ambulatory Visit: Payer: 59 | Admitting: Physical Therapy

## 2020-05-16 DIAGNOSIS — M5441 Lumbago with sciatica, right side: Secondary | ICD-10-CM

## 2020-05-16 DIAGNOSIS — R262 Difficulty in walking, not elsewhere classified: Secondary | ICD-10-CM

## 2020-05-16 DIAGNOSIS — M6281 Muscle weakness (generalized): Secondary | ICD-10-CM

## 2020-05-16 DIAGNOSIS — M62830 Muscle spasm of back: Secondary | ICD-10-CM

## 2020-05-16 NOTE — Patient Instructions (Addendum)
Access Code: [email or web address] URL: [email or web address] Date: 05/16/2020 Prepared by: Ruben Im  Exercises Hooklying Transversus Abdominis Palpation - 1 x daily - 7 x weekly - 1 sets - 10 reps Supine Transversus Abdominis Bracing with Heel Slide - 1 x daily - 7 x weekly - 1 sets - 10 reps SEATED ARM PULSES - 1 x daily - 7 x weekly - 1 sets - 10 reps Hooklying Clamshell with Resistance - 1 x daily - 7 x weekly - 1 sets - 10 reps   Trigger Point Dry Needling  . What is Trigger Point Dry Needling (DN)? o DN is a physical therapy technique used to treat muscle pain and dysfunction. Specifically, DN helps deactivate muscle trigger points (muscle knots).  o A thin filiform needle is used to penetrate the skin and stimulate the underlying trigger point. The goal is for a local twitch response (LTR) to occur and for the trigger point to relax. No medication of any kind is injected during the procedure.   . What Does Trigger Point Dry Needling Feel Like?  o The procedure feels different for each individual patient. Some patients report that they do not actually feel the needle enter the skin and overall the process is not painful. Very mild bleeding may occur. However, many patients feel a deep cramping in the muscle in which the needle was inserted. This is the local twitch response.   Marland Kitchen How Will I feel after the treatment? o Soreness is normal, and the onset of soreness may not occur for a few hours. Typically this soreness does not last longer than two days.  o Bruising is uncommon, however; ice can be used to decrease any possible bruising.  o In rare cases feeling tired or nauseous after the treatment is normal. In addition, your symptoms may get worse before they get better, this period will typically not last longer than 24 hours.   . What Can I do After My Treatment? o Increase your hydration by drinking more water for the next 24 hours. o You may place ice or heat on the areas  treated that have become sore, however, do not use heat on inflamed or bruised areas. Heat often brings more relief post needling. o You can continue your regular activities, but vigorous activity is not recommended initially after the treatment for 24 hours. o DN is best combined with other physical therapy such as strengthening, stretching, and other therapies.   TENS UNIT  This is helpful for muscle pain and spasm.   Search and Purchase a TENS 7000 2nd edition at www.tenspros.com or www.amazon.com  (It should be less than $30)     TENS unit instructions:   Do not shower or bathe with the unit on  Turn the unit off before removing electrodes or batteries  If the electrodes lose stickiness add a drop of water to the electrodes after they are disconnected from the unit and place on plastic sheet. If you continued to have difficulty, call the TENS unit company to purchase more electrodes.  Do not apply lotion on the skin area prior to use. Make sure the skin is clean and dry as this will help prolong the life of the electrodes.  After use, always check skin for unusual red areas, rash or other skin difficulties. If there are any skin problems, does not apply electrodes to the same area.  Never remove the electrodes from the unit by pulling the wires.  Do not use the TENS unit or  electrodes other than as directed.  Do not change electrode placement without consulting your therapist or physician.  Keep 2 fingers with between each electrode.     TENS stands for Transcutaneous Electrical Nerve Stimulation. In other words, electrical impulses are allowed to pass through the skin in order to excite a nerve.   Purpose and Use of TENS:  TENS is a method used to manage acute and chronic pain without the use of drugs. It has been effective in managing pain associated with surgery, sprains, strains, trauma, rheumatoid arthritis, and neuralgias. It is a non-addictive, low risk, and  non-invasive technique used to control pain. It is not, by any means, a curative form of treatment.   How TENS Works:  Most TENS units are a Paramedic unit powered by one 9 volt battery. Attached to the outside of the unit are two lead wires where two pins and/or snaps connect on each wire. All units come with a set of four reusable pads or electrodes. These are placed on the skin surrounding the area involved. By inserting the leads into  the pads, the electricity can pass from the unit making the circuit complete.  As the intensity is turned up slowly, the electrical current enters the body from the electrodes through the skin to the surrounding nerve fibers. This triggers the release of hormones from within the body. These hormones contain pain relievers. By increasing the circulation of these hormones, the person's pain may be lessened. It is also believed that the electrical stimulation itself helps to block the pain messages being sent to the brain, thus also decreasing the body's perception of pain.   Hazards:  TENS units are NOT to be used by patients with PACEMAKERS, DEFIBRILLATORS, DIABETIC PUMPS, PREGNANT WOMEN, and patients with SEIZURE DISORDERS.  TENS units are NOT to be used over the heart, throat, brain, or spinal cord.  One of the major side effects from the TENS unit may be skin irritation. Some people may develop a rash if they are sensitive to the materials used in the electrodes or the connecting wires.     Avoid overuse due the body getting used to the stem making it not as effective over time.

## 2020-05-16 NOTE — Therapy (Signed)
[facility] [address] [place], [place], [address] Phone: [phone]   Fax:  [phone]  Physical Therapy Treatment  Patient Details  Name: Taylor Sharp MRN: [id number] Date of Birth: 1960-07-30 Referring Provider (PT): Dr. Nikki Dom   Encounter Date: 05/16/2020   PT End of Session - 05/16/20 1743    Visit Number 3    Number of Visits 30    Date for PT Re-Evaluation 07/04/20    Authorization Type [employer] 30 visit limit    PT Start Time 1400    PT Stop Time 1450    PT Time Calculation (min) 50 min    Activity Tolerance Patient limited by pain           Past Medical History:  Diagnosis Date  . Asthma     Past Surgical History:  Procedure Laterality Date  . ABLATION    . IR ANGIO/SPINAL LEFT  03/11/2020  . IR ANGIO/SPINAL LEFT  03/11/2020  . IR ANGIO/SPINAL LEFT  03/11/2020  . IR ANGIO/SPINAL LEFT  03/11/2020  . IR ANGIO/SPINAL LEFT  03/11/2020  . IR ANGIO/SPINAL LEFT  03/11/2020  . IR ANGIO/SPINAL LEFT  03/11/2020  . IR ANGIO/SPINAL RIGHT  03/11/2020  . IR ANGIO/SPINAL RIGHT  03/11/2020  . IR ANGIO/SPINAL RIGHT  03/11/2020  . IR ANGIO/SPINAL RIGHT  03/11/2020  . IR ANGIO/SPINAL RIGHT  03/11/2020  . IR ANGIO/SPINAL RIGHT  03/11/2020  . IR ANGIOGRAM SELECTIVE EACH ADDITIONAL VESSEL  03/11/2020  . IR ANGIOGRAM SELECTIVE EACH ADDITIONAL VESSEL  03/11/2020  . IR US GUIDE VASC ACCESS RIGHT  03/11/2020  . RADIOLOGY WITH ANESTHESIA N/A 03/11/2020   Procedure: IR WITH ANESTHESIA;  Surgeon: Radiologist, Medication, MD;  Location: [facility];  Service: Radiology;  Laterality: N/A;    There were no vitals filed for this visit.   Subjective Assessment - 05/16/20 1403    Subjective I"m sore from sitting in the chair at the beauty shop. The pool was good.  I stayed in the deeper part.  It was harder than I thought.    Pertinent History Gabapentin and Robaxin;  no current restrictions;  typically hypermobile; left hip bursitis; osteopenia     Patient Stated Goals walk 100# dog; walk further and faster;  get back to work;  later return to tennis; later return to riding horses; keep up with daughter    Currently in Pain? Yes    Pain Score 5    Legs and back   Pain Location Back                             [facility] Adult PT Treatment/Exercise - 05/16/20 0001      Lumbar Exercises: Standing   Heel Raises 5 reps    Heel Raises Limitations single leg 5x    Other Standing Lumbar Exercises PIlates "running" mid air switch 10x      Lumbar Exercises: Supine   Ab Set 5 reps    Heel Slides 10 reps    Heel Slides Limitations with core set    Other Supine Lumbar Exercises attempted low level neural glides: ankle pump OK but knee flex/extension quite irritable at 5 reps    Other Supine Lumbar Exercises arm pulses head down 15x      Knee/Hip Exercises: Supine   Other Supine Knee/Hip Exercises red band double and single leg clam red band 25x      Moist Heat Therapy  Number Minutes Moist Heat 10 Minutes   prone concurrent with ES   Moist Heat Location Lumbar Spine      Electrical Stimulation   Electrical Stimulation Location bil thoracic and bil lumbar    Electrical Stimulation Action Premod 8 ma    Electrical Stimulation Parameters 10 min prone    Electrical Stimulation Goals Pain      Manual Therapy   Manual Therapy Soft tissue mobilization    Soft tissue mobilization bil thoracic paraspinals and bil lumbar paraspinals            Trigger Point Dry Needling - 05/16/20 0001    Consent Given? Yes    Education Handout Provided Yes    Muscles Treated Back/Hip Lumbar multifidi;Thoracic multifidi    Other Dry Needling bil    Lumbar multifidi Response Palpable increased muscle length    Thoracic multifidi response Palpable increased muscle length   superficial thoracic paraspinals               PT Education - 05/16/20 1743    Education Details red band supine clams; home TENS info; DN info     Person(s) Educated Patient    Methods Explanation;Demonstration    Comprehension Returned demonstration;Verbalized understanding            PT Short Term Goals - 05/09/20 1728      PT SHORT TERM GOAL #1   Title The patient will be able to initiate a basic HEP for core and LE strengthening (especially) right LE to facilitate return to ADLs    Time 4    Period Weeks    Status New    Target Date 06/06/20      PT SHORT TERM GOAL #2   Title The patient will report a 30% improvement in back and right LE pain particularly at the end of the day and with household chores including doing the dishwasher    Time 4    Period Weeks    Status New      PT SHORT TERM GOAL #3   Title FOTO functional score improved from 33% to 43% indicating improved function with less pain    Time 4    Period Weeks    Status New      PT SHORT TERM GOAL #4   Title The patient will be able to walk 8-10 minutes unassisted    Time 4    Period Weeks    Status New      PT SHORT TERM GOAL #5   Title The patient will have improved spinal flexion to 60 degrees, extension to 20 degrees and bil sidebending to 20 degrees    Time 4    Period Weeks    Status New             PT Long Term Goals - 05/09/20 1734      PT LONG TERM GOAL #1   Title The patient will be independent with safe self progression of HEP    Time 8    Period Weeks    Status New    Target Date 07/04/20      PT LONG TERM GOAL #2   Title The patient will report a 60% improvement in back and right LE symptoms at the end of the day and with household chores    Time 8    Period Weeks    Status New      PT LONG TERM GOAL #3   Title The  patient will have improved right LE and core strength grossly 4+/5 needed for reaching, sit to stand and walking longer distances    Time 8    Period Weeks    Status New      PT LONG TERM GOAL #4   Title The patient will demonstrate improved activity tolerance for sitting, standing and walking needed to  work at the [employer] in April or future return to work at [employer]    Time [place] #5   Title FOTO functional outcome score improved to 57% indicating improved function with less pain    Time 8    Period Weeks    Status New                 Plan - 05/16/20 1419    Clinical Impression Statement Patient is eager to progress with strengthening her right LE however she has high symptom/nerve irritability.  Low intensity ex's performed in limited repetition with therapist modifying accordingly.  Trial of Dn to superficial thoracic paraspinals to treat taut bands and spasm with post needling soreness particularly in lumbar musculature following.  ES/heat for pain relief.    Comorbidities osteopenia; hx of left hip bursitis; anxiety    Examination-Activity Limitations Locomotion Level;Bed Mobility;Reach Overhead;Bend;Caring for Others;Carry;Sleep;Lift;Stand;Hygiene/Grooming;Squat    Rehab Potential Good    PT Frequency 2x / week    PT Duration 8 weeks    PT Treatment/Interventions ADLs/Self Care Home Management;Aquatic Therapy;Cryotherapy;Electrical Stimulation;Ultrasound;Moist Heat;Iontophoresis 4mg /ml Dexamethasone;Functional mobility training;Therapeutic activities;Therapeutic exercise;Neuromuscular re-education;Manual techniques;Patient/family education;Taping;Dry needling    PT Next Visit Plan High symptom irritability right LE;  Pilates based progression for core muscle activation low level; ES as needed for pain control try to right LE; aquatic PT;  assess response to  DN to thoracic paraspinals and lumbar multifidi    PT Home Exercise Plan [email or web address]           Patient will benefit from skilled therapeutic intervention in order to improve the following deficits and impairments:  Decreased range of motion,Difficulty walking,Increased fascial restricitons,Increased muscle spasms,Decreased activity tolerance,Impaired perceived  functional ability,Pain,Decreased strength  Visit Diagnosis: Acute right-sided low back pain with right-sided sciatica  Muscle spasm of back  Muscle weakness (generalized)  Difficulty in walking, not elsewhere classified     Problem List Patient Active Problem List   Diagnosis Date Noted  . 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 03/09/2020  . 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 Intractable low back pain 03/09/2020  . Left hip pain 12/10/2018  . Osteopenia 05/11/2018  . Bursitis of hip 02/18/2017  . 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 GERD (gastroesophageal reflux disease) 08/04/2014  . Upper airway cough syndrome 08/04/2014  . Chest discomfort 05/25/2014   Ruben Im, PT 05/16/20 5:50 PM Phone: [phone] Fax: [phone] Alvera Singh 05/16/2020, 5:50 PM  [facility] [address] [place], [place], [address] Phone: [phone]   Fax:  [phone]  Name: Taylor Sharp MRN: [id number] Date of Birth: [birth year]

## 2020-05-18 ENCOUNTER — Ambulatory Visit: Payer: 59 | Admitting: Physical Therapy

## 2020-05-18 ENCOUNTER — Other Ambulatory Visit: Payer: Self-pay

## 2020-05-18 DIAGNOSIS — M62830 Muscle spasm of back: Secondary | ICD-10-CM

## 2020-05-18 DIAGNOSIS — M6281 Muscle weakness (generalized): Secondary | ICD-10-CM

## 2020-05-18 DIAGNOSIS — R262 Difficulty in walking, not elsewhere classified: Secondary | ICD-10-CM

## 2020-05-18 DIAGNOSIS — M5441 Lumbago with sciatica, right side: Secondary | ICD-10-CM

## 2020-05-18 NOTE — Therapy (Signed)
[facility] [address] [place], [place], [address] Phone: [phone]   Fax:  [phone]  Physical Therapy Treatment  Patient Details  Name: Taylor Sharp MRN: [id number] Date of Birth: [birth year] Referring Provider (PT): Dr. Nikki Dom   Encounter Date: 05/18/2020   PT End of Session - 05/18/20 1431    Visit Number 4    Number of Visits 30    Date for PT Re-Evaluation 07/04/20    Authorization Type [employer] 30 visit limit    PT Start Time 0930    PT Stop Time 1015    PT Time Calculation (min) 45 min    Activity Tolerance Patient limited by pain           Past Medical History:  Diagnosis Date  . Asthma     Past Surgical History:  Procedure Laterality Date  . ABLATION    . IR ANGIO/SPINAL LEFT  03/11/2020  . IR ANGIO/SPINAL LEFT  03/11/2020  . IR ANGIO/SPINAL LEFT  03/11/2020  . IR ANGIO/SPINAL LEFT  03/11/2020  . IR ANGIO/SPINAL LEFT  03/11/2020  . IR ANGIO/SPINAL LEFT  03/11/2020  . IR ANGIO/SPINAL LEFT  03/11/2020  . IR ANGIO/SPINAL RIGHT  03/11/2020  . IR ANGIO/SPINAL RIGHT  03/11/2020  . IR ANGIO/SPINAL RIGHT  03/11/2020  . IR ANGIO/SPINAL RIGHT  03/11/2020  . IR ANGIO/SPINAL RIGHT  03/11/2020  . IR ANGIO/SPINAL RIGHT  03/11/2020  . IR ANGIOGRAM SELECTIVE EACH ADDITIONAL VESSEL  03/11/2020  . IR ANGIOGRAM SELECTIVE EACH ADDITIONAL VESSEL  03/11/2020  . IR US GUIDE VASC ACCESS RIGHT  03/11/2020  . RADIOLOGY WITH ANESTHESIA N/A 03/11/2020   Procedure: IR WITH ANESTHESIA;  Surgeon: Radiologist, Medication, MD;  Location: [facility];  Service: Radiology;  Laterality: N/A;    There were no vitals filed for this visit.   Subjective Assessment - 05/18/20 0950    Subjective I've been really sore.  I might order a TENS unit.  I don't want to do the DN again.    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 Yes    Pain Score 5     Pain Location Back    Pain Orientation Right;Left    Pain Type Acute pain                             [facility] Adult PT Treatment/Exercise - 05/18/20 0001      Lumbar Exercises: Standing   Other Standing Lumbar Exercises rocker board 45 sec      Lumbar Exercises: Seated   Other Seated Lumbar Exercises foam roll push down 10x light push      Lumbar Exercises: Supine   Ab Set 5 reps    Isometric Hip Flexion 10 reps    Isometric Hip Flexion Limitations starting from feet on wedge    Other Supine Lumbar Exercises yellow band overhead lat pull down with open palm on handles 12x      Lumbar Exercises: Sidelying   Clam 5 reps;Right;Left      Moist Heat Therapy   Number Minutes Moist Heat 10  Minutes    Moist Heat Location Lumbar Spine   prone     Electrical Stimulation   Electrical Stimulation Location bil thoracic and bil lumbar    Electrical Stimulation Action IFC 9 ma 10 min    Electrical Stimulation Parameters prone    Electrical Stimulation Goals Pain                    PT Short Term Goals - 05/09/20 1728      PT SHORT TERM GOAL #1   Title The patient will be able to initiate a basic HEP for core and LE strengthening (especially) right LE to facilitate return to ADLs    Time 4    Period Weeks    Status New    Target Date 06/06/20      PT SHORT TERM GOAL #2   Title The patient will report a 30% improvement in back and right LE pain particularly at the end of the day and with household chores including doing the dishwasher    Time 4    Period Weeks    Status New      PT SHORT TERM GOAL #3   Title FOTO functional score improved from 33% to 43% indicating improved function with less pain    Time 4    Period Weeks     Status New      PT SHORT TERM GOAL #4   Title The patient will be able to walk 8-10 minutes unassisted    Time 4    Period Weeks    Status New      PT SHORT TERM GOAL #5   Title The patient will have improved spinal flexion to 60 degrees, extension to 20 degrees and bil sidebending to 20 degrees    Time 4    Period Weeks    Status New             PT Long Term Goals - 05/09/20 1734      PT LONG TERM GOAL #1   Title The patient will be independent with safe self progression of HEP    Time 8    Period Weeks    Status New    Target Date 07/04/20      PT LONG TERM GOAL #2   Title The patient will report a 60% improvement in back and right LE symptoms at the end of the day and with household chores    Time 8    Period Weeks    Status New      PT LONG TERM GOAL #3   Title The patient will have improved right LE and core strength grossly 4+/5 needed for reaching, sit to stand and walking longer distances    Time 8    Period Weeks    Status New      PT LONG TERM GOAL #4   Title The patient will demonstrate improved activity tolerance for sitting, standing and walking needed to work at the [employer] in April or future return to work at [employer]    Time 8    Period Weeks    Status New      PT [place] #5   Title FOTO functional outcome score improved to 57% indicating improved function with less pain    Time 8    Period Weeks    Status New                 Plan -  05/18/20 1432    Clinical Impression Statement The patient has difficulty activating core muscles and stabilizing while sitting and even while lying on a gel-filled hot pack.  She fatigues quickly in lumbo/pelvic and hip muscles.  Still with signficant nerve irritability right LE.  Decreased sensation noted on one side with ES.  Therapist monitoring response and modifying ex's accordingly.    Comorbidities osteopenia; hx of left hip bursitis; anxiety    Examination-Activity Limitations  Locomotion Level;Bed Mobility;Reach Overhead;Bend;Caring for Others;Carry;Sleep;Lift;Stand;Hygiene/Grooming;Squat    Stability/Clinical Decision Making Evolving/Moderate complexity    Rehab Potential Good    PT Frequency 2x / week    PT Duration 8 weeks    PT Treatment/Interventions ADLs/Self Care Home Management;Aquatic Therapy;Cryotherapy;Electrical Stimulation;Ultrasound;Moist Heat;Iontophoresis 4mg /ml Dexamethasone;Functional mobility training;Therapeutic activities;Therapeutic exercise;Neuromuscular re-education;Manual techniques;Patient/family education;Taping;Dry needling    PT Next Visit Plan High symptom irritability right LE;  Pilates based progression for core muscle activation low level; ES as needed for pain control try to right LE; aquatic PT    PT [place]           Patient will benefit from skilled therapeutic intervention in order to improve the following deficits and impairments:  Decreased range of motion,Difficulty walking,Increased fascial restricitons,Increased muscle spasms,Decreased activity tolerance,Impaired perceived functional ability,Pain,Decreased strength  Visit Diagnosis: Acute right-sided low back pain with right-sided sciatica  Muscle spasm of back  Muscle weakness (generalized)  Difficulty in walking, not elsewhere classified     Problem List Patient Active Problem List   Diagnosis Date Noted  . 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 03/09/2020  . 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 Intractable low back pain 03/09/2020  . Left hip pain 12/10/2018  . Osteopenia 05/11/2018  . Bursitis of hip 02/18/2017  . 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 GERD (gastroesophageal reflux disease) 08/04/2014  . Upper airway cough syndrome 08/04/2014  . Chest discomfort 05/25/2014   Ruben Im, PT 05/18/20 2:41 PM Phone: [phone] Fax: [phone] Alvera Singh 05/18/2020, 2:40 PM  [facility] [address] [place], [place], [address] Phone: [phone]   Fax:  [phone]  Name: Maryam Feely MRN: [id number] Date of Birth: 1961-01-21

## 2020-05-22 ENCOUNTER — Ambulatory Visit: Payer: 59 | Admitting: Physical Therapy

## 2020-05-22 ENCOUNTER — Other Ambulatory Visit: Payer: Self-pay

## 2020-05-22 ENCOUNTER — Encounter: Payer: Self-pay | Admitting: Physical Therapy

## 2020-05-22 DIAGNOSIS — M62830 Muscle spasm of back: Secondary | ICD-10-CM

## 2020-05-22 DIAGNOSIS — R262 Difficulty in walking, not elsewhere classified: Secondary | ICD-10-CM

## 2020-05-22 DIAGNOSIS — M5441 Lumbago with sciatica, right side: Secondary | ICD-10-CM | POA: Diagnosis not present

## 2020-05-22 DIAGNOSIS — M6281 Muscle weakness (generalized): Secondary | ICD-10-CM

## 2020-05-22 NOTE — Therapy (Signed)
[facility] [address] [place], [place], [address] Phone: [phone]   Fax:  [phone]  Physical Therapy Treatment  Patient Details  Name: Taylor Sharp MRN: [id number] Date of Birth: Aug 08, 1960 Referring Provider (PT): Dr. Nikki Dom   Encounter Date: 05/22/2020   PT End of Session - 05/22/20 1149    Visit Number 5    Number of Visits 30    Date for PT Re-Evaluation 07/04/20    Authorization Type [employer] 30 visit limit    PT Start Time 1145    PT Stop Time 1230    PT Time Calculation (min) 45 min    Activity Tolerance Patient limited by pain;Patient tolerated treatment well    Behavior During Therapy [facility] for tasks assessed/performed           Past Medical History:  Diagnosis Date  . Asthma     Past Surgical History:  Procedure Laterality Date  . ABLATION    . IR ANGIO/SPINAL LEFT  03/11/2020  . IR ANGIO/SPINAL LEFT  03/11/2020  . IR ANGIO/SPINAL LEFT  03/11/2020  . IR ANGIO/SPINAL LEFT  03/11/2020  . IR ANGIO/SPINAL LEFT  03/11/2020  . IR ANGIO/SPINAL LEFT  03/11/2020  . IR ANGIO/SPINAL LEFT  03/11/2020  . IR ANGIO/SPINAL RIGHT  03/11/2020  . IR ANGIO/SPINAL RIGHT  03/11/2020  . IR ANGIO/SPINAL RIGHT  03/11/2020  . IR ANGIO/SPINAL RIGHT  03/11/2020  . IR ANGIO/SPINAL RIGHT  03/11/2020  . IR ANGIO/SPINAL RIGHT  03/11/2020  . IR ANGIOGRAM SELECTIVE EACH ADDITIONAL VESSEL  03/11/2020  . IR ANGIOGRAM SELECTIVE EACH ADDITIONAL VESSEL  03/11/2020  . IR US GUIDE VASC ACCESS RIGHT  03/11/2020  . RADIOLOGY WITH ANESTHESIA N/A 03/11/2020   Procedure: IR WITH ANESTHESIA;  Surgeon: Radiologist, Medication, MD;  Location: [facility];  Service: Radiology;  Laterality: N/A;    There were no vitals filed for this visit.   Subjective Assessment - 05/22/20 1151    Subjective Mid back pain today Lt > RT today    Pertinent History Gabapentin and Robaxin;  no current restrictions;  typically hypermobile; left hip bursitis; osteopenia     Currently in Pain? Yes    Pain Score 5     Pain Location Back    Pain Orientation Mid    Pain Descriptors / Indicators Sore    Aggravating Factors  If i do too much    Pain Relieving Factors Hooklying    Multiple Pain Sites No                             [facility] Adult PT Treatment/Exercise - 05/22/20 0001      Lumbar Exercises: Aerobic   Nustep L1 x 3 min slow pace PTA montiored pt for nerve pain increases      Lumbar Exercises: Supine   Ab Set --   2x5   Clam --   Single 6x Bil   Heel Slides --   6x bil used floor slider   Bent Knee Raise --   6x bil   Other Supine Lumbar Exercises red band 10x Bil VC for core activation    Other Supine Lumbar Exercises cane with 1# wt chest press 10x, then 2# 10X, VC for core activation.      Lumbar Exercises: Sidelying   Clam Both;10 reps  PT Short Term Goals - 05/09/20 1728      PT SHORT TERM GOAL #1   Title The patient will be able to initiate a basic HEP for core and LE strengthening (especially) right LE to facilitate return to ADLs    Time 4    Period Weeks    Status New    Target Date 06/06/20      PT SHORT TERM GOAL #2   Title The patient will report a 30% improvement in back and right LE pain particularly at the end of the day and with household chores including doing the dishwasher    Time 4    Period Weeks    Status New      PT SHORT TERM GOAL #3   Title FOTO functional score improved from 33% to 43% indicating improved function with less pain    Time 4    Period Weeks    Status New      PT SHORT TERM GOAL #4   Title The patient will be able to walk 8-10 minutes unassisted    Time 4    Period Weeks    Status New      PT SHORT TERM GOAL #5   Title The patient will have improved spinal flexion to 60 degrees, extension to 20 degrees and bil sidebending to 20 degrees    Time 4    Period Weeks    Status New             PT Long Term Goals - 05/09/20 1734      PT  LONG TERM GOAL #1   Title The patient will be independent with safe self progression of HEP    Time 8    Period Weeks    Status New    Target Date 07/04/20      PT LONG TERM GOAL #2   Title The patient will report a 60% improvement in back and right LE symptoms at the end of the day and with household chores    Time 8    Period Weeks    Status New      PT LONG TERM GOAL #3   Title The patient will have improved right LE and core strength grossly 4+/5 needed for reaching, sit to stand and walking longer distances    Time 8    Period Weeks    Status New      PT LONG TERM GOAL #4   Title The patient will demonstrate improved activity tolerance for sitting, standing and walking needed to work at the [employer] in April or future return to work at [employer]    Time 8    Period Weeks    Status New      PT [place] #5   Title FOTO functional outcome score improved to 57% indicating improved function with less pain    Time 8    Period Weeks    Status New                 Plan - 05/22/20 1231    Clinical Impression Statement Pt continues to require low level core, back and LE theraputic exercises. By increasing reps slowly we were able to maintain her pain levels and not increase them during the session. Rt gastroc was 'fired up" by 3 min on the Nustep to end todays session. Pt did demonstrate improving ability to generate a core contraction especially with practice ( reps ). Pt  did report sitting up exercises tend to increase her back pain so we kept all arm movements in supine to manage her pain and work a little more gradually into sitting.    Personal Factors and Comorbidities Comorbidity 1;Profession;Comorbidity 2;Comorbidity 3+;Past/Current Experience    Comorbidities osteopenia; hx of left hip bursitis; anxiety    Examination-Activity Limitations Locomotion Level;Bed Mobility;Reach Overhead;Bend;Caring for Others;Carry;Sleep;Lift;Stand;Hygiene/Grooming;Squat     Examination-Participation Restrictions Meal Prep;Cleaning;Occupation;Community Activity;Driving;Laundry    Stability/Clinical Decision Making Evolving/Moderate complexity    Rehab Potential Good    PT Frequency 2x / week    PT Duration 8 weeks    PT Treatment/Interventions ADLs/Self Care Home Management;Aquatic Therapy;Cryotherapy;Electrical Stimulation;Ultrasound;Moist Heat;Iontophoresis 4mg /ml Dexamethasone;Functional mobility training;Therapeutic activities;Therapeutic exercise;Neuromuscular re-education;Manual techniques;Patient/family education;Taping;Dry needling    PT Next Visit Plan Aquatics next    PT Home Exercise Plan [id number]    Consulted and Agree with Plan of Care Patient           Patient will benefit from skilled therapeutic intervention in order to improve the following deficits and impairments:  Decreased range of motion,Difficulty walking,Increased fascial restricitons,Increased muscle spasms,Decreased activity tolerance,Impaired perceived functional ability,Pain,Decreased strength  Visit Diagnosis: Acute right-sided low back pain with right-sided sciatica  Muscle spasm of back  Muscle weakness (generalized)  Difficulty in walking, not elsewhere classified     Problem List Patient Active Problem List   Diagnosis Date Noted  . 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 03/09/2020  . 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 Intractable low back pain 03/09/2020  . Left hip pain 12/10/2018  . Osteopenia 05/11/2018  . Bursitis of hip 02/18/2017  . 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 GERD (gastroesophageal reflux disease) 08/04/2014  . Upper airway cough syndrome 08/04/2014  . Chest discomfort 05/25/2014    Vollie Brunty, PTA 05/22/2020, 12:37 PM  [facility] [address] [place], [place], [address] Phone: [phone]   Fax:  [phone]  Name: Kaylia Winborne MRN: [id number] Date of Birth: [birth year]

## 2020-05-24 ENCOUNTER — Encounter: Payer: 59 | Admitting: Physical Therapy

## 2020-05-26 ENCOUNTER — Encounter: Payer: Self-pay | Admitting: Physical Therapy

## 2020-05-26 ENCOUNTER — Ambulatory Visit: Payer: 59 | Admitting: Physical Therapy

## 2020-05-26 ENCOUNTER — Other Ambulatory Visit: Payer: Self-pay

## 2020-05-26 DIAGNOSIS — R262 Difficulty in walking, not elsewhere classified: Secondary | ICD-10-CM

## 2020-05-26 DIAGNOSIS — M6281 Muscle weakness (generalized): Secondary | ICD-10-CM

## 2020-05-26 DIAGNOSIS — M5441 Lumbago with sciatica, right side: Secondary | ICD-10-CM

## 2020-05-26 DIAGNOSIS — M62830 Muscle spasm of back: Secondary | ICD-10-CM

## 2020-05-26 NOTE — Therapy (Signed)
[facility] [address] [place], [place], [address] Phone: [phone]   Fax:  [phone]  Physical Therapy Treatment  Patient Details  Name: Taylor Sharp MRN: [id number] Date of Birth: 09/13/1960 Referring Provider (PT): Dr. Nikki Dom   Encounter Date: 05/26/2020   PT End of Session - 05/26/20 1525    Visit Number 6    Number of Visits 30    Date for PT Re-Evaluation 07/04/20    Authorization Type [employer] 30 visit limit    PT Start Time 1345    PT Stop Time 1423    PT Time Calculation (min) 38 min    Activity Tolerance Patient limited by fatigue;Patient limited by pain    Behavior During Therapy Agitated           Past Medical History:  Diagnosis Date  . Asthma     Past Surgical History:  Procedure Laterality Date  . ABLATION    . IR ANGIO/SPINAL LEFT  03/11/2020  . IR ANGIO/SPINAL LEFT  03/11/2020  . IR ANGIO/SPINAL LEFT  03/11/2020  . IR ANGIO/SPINAL LEFT  03/11/2020  . IR ANGIO/SPINAL LEFT  03/11/2020  . IR ANGIO/SPINAL LEFT  03/11/2020  . IR ANGIO/SPINAL LEFT  03/11/2020  . IR ANGIO/SPINAL RIGHT  03/11/2020  . IR ANGIO/SPINAL RIGHT  03/11/2020  . IR ANGIO/SPINAL RIGHT  03/11/2020  . IR ANGIO/SPINAL RIGHT  03/11/2020  . IR ANGIO/SPINAL RIGHT  03/11/2020  . IR ANGIO/SPINAL RIGHT  03/11/2020  . IR ANGIOGRAM SELECTIVE EACH ADDITIONAL VESSEL  03/11/2020  . IR ANGIOGRAM SELECTIVE EACH ADDITIONAL VESSEL  03/11/2020  . IR US GUIDE VASC ACCESS RIGHT  03/11/2020  . RADIOLOGY WITH ANESTHESIA N/A 03/11/2020   Procedure: IR WITH ANESTHESIA;  Surgeon: Radiologist, Medication, MD;  Location: [facility];  Service: Radiology;  Laterality: N/A;    There were no vitals filed for this visit.   Subjective Assessment - 05/26/20 1524    Subjective I did not sleep well last night and my pain has ramped up. I am very tired today.    Pertinent History Gabapentin and Robaxin;  no current restrictions;  typically hypermobile; left hip bursitis;  osteopenia    Currently in Pain? Yes   RT buttocks, anterior lower leg, calf, LT mid back > RT   Pain Score 7     Pain Descriptors / Indicators Stabbing;Shooting;Tender           Treatment: Aquatics Patient seen for aquatic therapy today.  Treatment took place in water 2.5-4 feet deep depending upon activity.  Pt entered the pool via stairs with heavy use of hand rails. Water temp 90 degrees F.  Seated water bench with 75% submersion Pt performed seated LE AROM exercises 20x in all planes,pain assessment.  Decompression float in a variety of positions for 3 min intervals. 1. Aquajogger with blue block noodle and head support: most horizontal position 2. Blue block noodle anterior body for more flexion based, 3. Modified breast stroke arms with noodle under chest: short length of pool 4x  Upper thoracic depth: Gentle LE AROM 10 in each direction, increased RT LE pain.                             PT Short Term Goals - 05/09/20 1728      PT SHORT TERM GOAL #1   Title The patient will be able to initiate a basic HEP for core and LE  strengthening (especially) right LE to facilitate return to ADLs    Time 4    Period Weeks    Status New    Target Date 06/06/20      PT SHORT TERM GOAL #2   Title The patient will report a 30% improvement in back and right LE pain particularly at the end of the day and with household chores including doing the dishwasher    Time 4    Period Weeks    Status New      PT SHORT TERM GOAL #3   Title FOTO functional score improved from 33% to 43% indicating improved function with less pain    Time 4    Period Weeks    Status New      PT SHORT TERM GOAL #4   Title The patient will be able to walk 8-10 minutes unassisted    Time 4    Period Weeks    Status New      PT SHORT TERM GOAL #5   Title The patient will have improved spinal flexion to 60 degrees, extension to 20 degrees and bil sidebending to 20 degrees    Time 4     Period Weeks    Status New             PT Long Term Goals - 05/09/20 1734      PT LONG TERM GOAL #1   Title The patient will be independent with safe self progression of HEP    Time 8    Period Weeks    Status New    Target Date 07/04/20      PT LONG TERM GOAL #2   Title The patient will report a 60% improvement in back and right LE symptoms at the end of the day and with household chores    Time 8    Period Weeks    Status New      PT LONG TERM GOAL #3   Title The patient will have improved right LE and core strength grossly 4+/5 needed for reaching, sit to stand and walking longer distances    Time 8    Period Weeks    Status New      PT LONG TERM GOAL #4   Title The patient will demonstrate improved activity tolerance for sitting, standing and walking needed to work at the [employer] in April or future return to work at [employer]    Time 8    Period Weeks    Status New      PT [place] #5   Title FOTO functional outcome score improved to 57% indicating improved function with less pain    Time 8    Period Weeks    Status New                 Plan - 05/26/20 1527    Clinical Impression Statement Pt arrives for aquatic PT after a poor nights sleep and increased symtptoms. Pt very tired and stressed out, often teary. Pt did not tolerate much movement even in the water, pt was able to float in various positions that provided decompression to her spine or support to her trunk. Pt very much wants to swim ( strokes) but was advised to keep with more gentle movements until she tolerates them better. Pt had increased Rt leg symtpoms after AROM in upper chest depth. Flexion based float gave best relief.    Personal Factors and Comorbidities  Comorbidity 1;Profession;Comorbidity 2;Comorbidity 3+;Past/Current Experience    Comorbidities osteopenia; hx of left hip bursitis; anxiety    Examination-Activity Limitations Locomotion Level;Bed Mobility;Reach  Overhead;Bend;Caring for Others;Carry;Sleep;Lift;Stand;Hygiene/Grooming;Squat    Examination-Participation Restrictions Meal Prep;Cleaning;Occupation;Community Activity;Driving;Laundry    Stability/Clinical Decision Making Evolving/Moderate complexity    Rehab Potential Good    PT Frequency 2x / week    PT Duration 8 weeks    PT Treatment/Interventions ADLs/Self Care Home Management;Aquatic Therapy;Cryotherapy;Electrical Stimulation;Ultrasound;Moist Heat;Iontophoresis 4mg /ml Dexamethasone;Functional mobility training;Therapeutic activities;Therapeutic exercise;Neuromuscular re-education;Manual techniques;Patient/family education;Taping;Dry needling    PT Next Visit Plan Assess pain and continue with matbased core and trunk strength, assess leg symptoms before proceeding with LE exercises.    PT Home Exercise Plan [id number]    Consulted and Agree with Plan of Care Patient           Patient will benefit from skilled therapeutic intervention in order to improve the following deficits and impairments:  Decreased range of motion,Difficulty walking,Increased fascial restricitons,Increased muscle spasms,Decreased activity tolerance,Impaired perceived functional ability,Pain,Decreased strength  Visit Diagnosis: Acute right-sided low back pain with right-sided sciatica  Muscle spasm of back  Muscle weakness (generalized)  Difficulty in walking, not elsewhere classified     Problem List Patient Active Problem List   Diagnosis Date Noted  . 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 03/09/2020  . 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 Intractable low back pain 03/09/2020  . Left hip pain 12/10/2018  . Osteopenia 05/11/2018  . Bursitis of hip 02/18/2017  . 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 GERD (gastroesophageal reflux disease) 08/04/2014  . Upper airway cough syndrome 08/04/2014  . Chest discomfort 05/25/2014    Allyne Hebert, PTA 05/26/2020,  3:32 PM  [facility] [address], [place], [place], [address] Phone: [phone]   Fax:  [phone]  Name: Nela Bascom MRN: [id number] Date of Birth: [birth year]

## 2020-05-30 ENCOUNTER — Other Ambulatory Visit: Payer: Self-pay

## 2020-05-30 ENCOUNTER — Ambulatory Visit: Payer: 59 | Admitting: Physical Therapy

## 2020-05-30 DIAGNOSIS — M5441 Lumbago with sciatica, right side: Secondary | ICD-10-CM | POA: Diagnosis not present

## 2020-05-30 DIAGNOSIS — M6281 Muscle weakness (generalized): Secondary | ICD-10-CM

## 2020-05-30 DIAGNOSIS — R262 Difficulty in walking, not elsewhere classified: Secondary | ICD-10-CM

## 2020-05-30 DIAGNOSIS — M62830 Muscle spasm of back: Secondary | ICD-10-CM

## 2020-05-30 NOTE — Patient Instructions (Signed)
sleep 

## 2020-05-30 NOTE — Therapy (Signed)
[facility] [address] [place], [place], [address] Phone: [phone]   Fax:  [phone]  Physical Therapy Treatment  Patient Details  Name: Taylor Sharp MRN: [id number] Date of Birth: [birth year] Referring Provider (PT): Dr. Nikki Dom   Encounter Date: 05/30/2020   PT End of Session - 05/30/20 2046    Visit Number 7    Number of Visits 30    Date for PT Re-Evaluation 07/04/20    Authorization Type [employer] 30 visit limit    PT Start Time 0845    PT Stop Time 0927    PT Time Calculation (min) 42 min    Activity Tolerance Patient limited by pain           Past Medical History:  Diagnosis Date  . Asthma     Past Surgical History:  Procedure Laterality Date  . ABLATION    . IR ANGIO/SPINAL LEFT  03/11/2020  . IR ANGIO/SPINAL LEFT  03/11/2020  . IR ANGIO/SPINAL LEFT  03/11/2020  . IR ANGIO/SPINAL LEFT  03/11/2020  . IR ANGIO/SPINAL LEFT  03/11/2020  . IR ANGIO/SPINAL LEFT  03/11/2020  . IR ANGIO/SPINAL LEFT  03/11/2020  . IR ANGIO/SPINAL RIGHT  03/11/2020  . IR ANGIO/SPINAL RIGHT  03/11/2020  . IR ANGIO/SPINAL RIGHT  03/11/2020  . IR ANGIO/SPINAL RIGHT  03/11/2020  . IR ANGIO/SPINAL RIGHT  03/11/2020  . IR ANGIO/SPINAL RIGHT  03/11/2020  . IR ANGIOGRAM SELECTIVE EACH ADDITIONAL VESSEL  03/11/2020  . IR ANGIOGRAM SELECTIVE EACH ADDITIONAL VESSEL  03/11/2020  . IR US GUIDE VASC ACCESS RIGHT  03/11/2020  . RADIOLOGY WITH ANESTHESIA N/A 03/11/2020   Procedure: IR WITH ANESTHESIA;  Surgeon: Radiologist, Medication, MD;  Location: [place];  Service: Radiology;  Laterality: N/A;    There were no vitals filed for this visit.   Subjective Assessment - 05/30/20 0847    Subjective It was hard getting out of the pool and dressing.  I felt really heavy.  I'm sore today.  Not sleeping well.  It might be anxiety and stress.  I got a TENs unit for home.    Pertinent History Gabapentin and Robaxin;  no current restrictions;  typically  hypermobile; left hip bursitis; osteopenia    Patient Stated Goals walk 100# dog; walk further and faster;  get back to work;  later return to tennis; later return to riding horses; keep up with daughter    Currently in Pain? Yes    Pain Score 6     Pain Location Back    Pain Radiating Towards right LE                             [facility] Adult PT Treatment/Exercise - 05/30/20 0001      Therapeutic Activites    Therapeutic Activities Other Therapeutic Activities    Other Therapeutic Activities sleep hygiene      Lumbar Exercises: Aerobic   UBE (Upper Arm Bike) 1 min forward only      Lumbar Exercises: Standing   Row Both;Strengthening;5 reps    Theraband Level (Row) Level 3 (Green)    Row Limitations discontinued secondary to inc pain      Lumbar Exercises: Seated   Other Seated Lumbar Exercises in chair power tower 15# 10x      Lumbar Exercises: Supine   Ab Set 5 reps    Other Supine Lumbar Exercises red band overhead 5x; horizontal abduction  5x; sash 5x; external rotation 5x    Other Supine Lumbar Exercises cane press 3# 10x                  PT Education - 05/30/20 2045    Education Details sleep hygiene    Person(s) Educated Patient    Methods Explanation;Handout    Comprehension Verbalized understanding            PT Short Term Goals - 05/09/20 1728      PT SHORT TERM GOAL #1   Title The patient will be able to initiate a basic HEP for core and LE strengthening (especially) right LE to facilitate return to ADLs    Time 4    Period Weeks    Status New    Target Date 06/06/20      PT SHORT TERM GOAL #2   Title The patient will report a 30% improvement in back and right LE pain particularly at the end of the day and with household chores including doing the dishwasher    Time 4    Period Weeks    Status New      PT SHORT TERM GOAL #3   Title FOTO functional score improved from 33% to 43% indicating improved function with less pain     Time 4    Period Weeks    Status New      PT SHORT TERM GOAL #4   Title The patient will be able to walk 8-10 minutes unassisted    Time 4    Period Weeks    Status New      PT SHORT TERM GOAL #5   Title The patient will have improved spinal flexion to 60 degrees, extension to 20 degrees and bil sidebending to 20 degrees    Time 4    Period Weeks    Status New             PT Long Term Goals - 05/09/20 1734      PT LONG TERM GOAL #1   Title The patient will be independent with safe self progression of HEP    Time 8    Period Weeks    Status New    Target Date 07/04/20      PT LONG TERM GOAL #2   Title The patient will report a 60% improvement in back and right LE symptoms at the end of the day and with household chores    Time 8    Period Weeks    Status New      PT LONG TERM GOAL #3   Title The patient will have improved right LE and core strength grossly 4+/5 needed for reaching, sit to stand and walking longer distances    Time 8    Period Weeks    Status New      PT LONG TERM GOAL #4   Title The patient will demonstrate improved activity tolerance for sitting, standing and walking needed to work at the [employer] in April or future return to work at [employer]    Time 8    Period Weeks    Status New      PT [place] #5   Title FOTO functional outcome score improved to 57% indicating improved function with less pain    Time 8    Period Weeks    Status New  Plan - 05/30/20 0857    Clinical Impression Statement As with the last visit, the patient reports she is not sleeping well.  We discussed priorizing sleep as needed for healing and good habits to promote sleep.  Her LE symptoms continue to be highly irritable so we focused on UE movements only in supine, seated and standing however her calf paresthesia is present in all positions.  Very limited standing tolerance.  Therapist very closely monitoring response and modifying  often based on symptoms.    Personal Factors and Comorbidities Comorbidity 1;Profession;Comorbidity 2;Comorbidity 3+;Past/Current Experience    Comorbidities osteopenia; hx of left hip bursitis; anxiety    Examination-Activity Limitations Locomotion Level;Bed Mobility;Reach Overhead;Bend;Caring for Others;Carry;Sleep;Lift;Stand;Hygiene/Grooming;Squat    Examination-Participation Restrictions Meal Prep;Cleaning;Occupation;Community Activity;Driving;Laundry    Stability/Clinical Decision Making Evolving/Moderate complexity    Rehab Potential Good    PT Frequency 2x / week    PT Duration 8 weeks    PT Treatment/Interventions ADLs/Self Care Home Management;Aquatic Therapy;Cryotherapy;Electrical Stimulation;Ultrasound;Moist Heat;Iontophoresis 4mg /ml Dexamethasone;Functional mobility training;Therapeutic activities;Therapeutic exercise;Neuromuscular re-education;Manual techniques;Patient/family education;Taping;Dry needling    PT Next Visit Plan High symptom irritability; focus on UE ex like UBE, seated rows; mat ex's; aquatic ex    PT Home Exercise Plan [id number]           Patient will benefit from skilled therapeutic intervention in order to improve the following deficits and impairments:  Decreased range of motion,Difficulty walking,Increased fascial restricitons,Increased muscle spasms,Decreased activity tolerance,Impaired perceived functional ability,Pain,Decreased strength  Visit Diagnosis: Acute right-sided low back pain with right-sided sciatica  Muscle spasm of back  Muscle weakness (generalized)  Difficulty in walking, not elsewhere classified     Problem List Patient Active Problem List   Diagnosis Date Noted  . 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 03/09/2020  . 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 Intractable low back pain 03/09/2020  . Left hip pain 12/10/2018  . Osteopenia 05/11/2018  . Bursitis of hip 02/18/2017  . 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 GERD (gastroesophageal reflux disease) 08/04/2014  . Upper airway cough syndrome 08/04/2014  . Chest discomfort 05/25/2014   Ruben Im, PT 05/30/20 8:54 PM Phone: [phone] Fax: [phone] Alvera Singh 05/30/2020, 8:53 PM  [facility] [address], [place], [place], [address] Phone: [phone]   Fax:  [phone]  Name: Taylor Sharp MRN: [id number] Date of Birth: Nov 24, 1960

## 2020-06-01 ENCOUNTER — Ambulatory Visit: Payer: 59 | Admitting: Physical Therapy

## 2020-06-01 ENCOUNTER — Other Ambulatory Visit: Payer: Self-pay

## 2020-06-01 DIAGNOSIS — M5441 Lumbago with sciatica, right side: Secondary | ICD-10-CM

## 2020-06-01 DIAGNOSIS — M62830 Muscle spasm of back: Secondary | ICD-10-CM

## 2020-06-01 DIAGNOSIS — M6281 Muscle weakness (generalized): Secondary | ICD-10-CM

## 2020-06-01 DIAGNOSIS — R262 Difficulty in walking, not elsewhere classified: Secondary | ICD-10-CM

## 2020-06-01 NOTE — Therapy (Signed)
[facility] [address], [place], [place], [address] Phone: [phone]   Fax:  [phone]  Physical Therapy Treatment  Patient Details  Name: Taylor Sharp MRN: [id number] Date of Birth: 13-Mar-1960 Referring Provider (PT): Dr. Nikki Dom   Encounter Date: 06/01/2020   PT End of Session - 06/01/20 0918    Visit Number 8    Number of Visits 30    Date for PT Re-Evaluation 07/04/20    Authorization Type [employer] 30 visit limit    PT Start Time 0845    PT Stop Time 0927    PT Time Calculation (min) 42 min    Activity Tolerance Patient limited by pain           Past Medical History:  Diagnosis Date  . Asthma     Past Surgical History:  Procedure Laterality Date  . ABLATION    . IR ANGIO/SPINAL LEFT  03/11/2020  . IR ANGIO/SPINAL LEFT  03/11/2020  . IR ANGIO/SPINAL LEFT  03/11/2020  . IR ANGIO/SPINAL LEFT  03/11/2020  . IR ANGIO/SPINAL LEFT  03/11/2020  . IR ANGIO/SPINAL LEFT  03/11/2020  . IR ANGIO/SPINAL LEFT  03/11/2020  . IR ANGIO/SPINAL RIGHT  03/11/2020  . IR ANGIO/SPINAL RIGHT  03/11/2020  . IR ANGIO/SPINAL RIGHT  03/11/2020  . IR ANGIO/SPINAL RIGHT  03/11/2020  . IR ANGIO/SPINAL RIGHT  03/11/2020  . IR ANGIO/SPINAL RIGHT  03/11/2020  . IR ANGIOGRAM SELECTIVE EACH ADDITIONAL VESSEL  03/11/2020  . IR ANGIOGRAM SELECTIVE EACH ADDITIONAL VESSEL  03/11/2020  . IR US GUIDE VASC ACCESS RIGHT  03/11/2020  . RADIOLOGY WITH ANESTHESIA N/A 03/11/2020   Procedure: IR WITH ANESTHESIA;  Surgeon: Radiologist, Medication, MD;  Location: [place];  Service: Radiology;  Laterality: N/A;    There were no vitals filed for this visit.   Subjective Assessment - 06/01/20 0852    Subjective I was tired and sore the next day.  I went out to eat with friends and that was hard.    Pertinent History Gabapentin and Robaxin;  no current restrictions;  typically hypermobile; left hip bursitis; osteopenia    Patient Stated Goals walk 100# dog; walk  further and faster;  get back to work;  later return to tennis; later return to riding horses; keep up with daughter    Currently in Pain? Yes    Pain Score 6     Pain Location Back    Pain Type Acute pain                             [facility] Adult PT Treatment/Exercise - 06/01/20 0001      Lumbar Exercises: Aerobic   UBE (Upper Arm Bike) 3 min forward only      Lumbar Exercises: Seated   Other Seated Lumbar Exercises in chair power tower 15# 15x      Lumbar Exercises: Supine   AB Set Limitations LEs on wedge ball squeeze 7x    Clam 10 reps    Clam Limitations with LEs on wedge red band    Heel Slides 5 reps    Heel Slides Limitations with LEs on wedge    Other Supine Lumbar Exercises red band overhead 5x; horizontal abduction 10x; sash 5x; external rotation 10x    Other Supine Lumbar Exercises cane press 3# 10x      Shoulder Exercises: Supine   Other Supine Exercises red band single arm shoulder  extension with therapist anchoring overhead 10x right/left                    PT Short Term Goals - 05/09/20 1728      PT SHORT TERM GOAL #1   Title The patient will be able to initiate a basic HEP for core and LE strengthening (especially) right LE to facilitate return to ADLs    Time 4    Period Weeks    Status New    Target Date 06/06/20      PT SHORT TERM GOAL #2   Title The patient will report a 30% improvement in back and right LE pain particularly at the end of the day and with household chores including doing the dishwasher    Time 4    Period Weeks    Status New      PT SHORT TERM GOAL #3   Title FOTO functional score improved from 33% to 43% indicating improved function with less pain    Time 4    Period Weeks    Status New      PT SHORT TERM GOAL #4   Title The patient will be able to walk 8-10 minutes unassisted    Time 4    Period Weeks    Status New      PT SHORT TERM GOAL #5   Title The patient will have improved spinal flexion  to 60 degrees, extension to 20 degrees and bil sidebending to 20 degrees    Time 4    Period Weeks    Status New             PT Long Term Goals - 05/09/20 1734      PT LONG TERM GOAL #1   Title The patient will be independent with safe self progression of HEP    Time 8    Period Weeks    Status New    Target Date 07/04/20      PT LONG TERM GOAL #2   Title The patient will report a 60% improvement in back and right LE symptoms at the end of the day and with household chores    Time 8    Period Weeks    Status New      PT LONG TERM GOAL #3   Title The patient will have improved right LE and core strength grossly 4+/5 needed for reaching, sit to stand and walking longer distances    Time 8    Period Weeks    Status New      PT LONG TERM GOAL #4   Title The patient will demonstrate improved activity tolerance for sitting, standing and walking needed to work at the [employer] in April or future return to work at [employer]    Time 8    Period Weeks    Status New      PT [place] #5   Title FOTO functional outcome score improved to 57% indicating improved function with less pain    Time 8    Period Weeks    Status New                 Plan - 06/01/20 2026    Clinical Impression Statement Continue graded exposure to low intensity exercise secondary to symptom irritability.  She is able to perform increased repetitions with focus on mostly UE movements to avoid exacerbating right LE peripheral symptoms.  Therapist very closely monitoring  response and modifying exercises accordingly.  Symptoms no worse post session.    Personal Factors and Comorbidities Comorbidity 1;Profession;Comorbidity 2;Comorbidity 3+;Past/Current Experience    Comorbidities osteopenia; hx of left hip bursitis; anxiety    Examination-Activity Limitations Locomotion Level;Bed Mobility;Reach Overhead;Bend;Caring for Others;Carry;Sleep;Lift;Stand;Hygiene/Grooming;Squat     Stability/Clinical Decision Making Evolving/Moderate complexity    Rehab Potential Good    PT Frequency 2x / week    PT Duration 8 weeks    PT Treatment/Interventions ADLs/Self Care Home Management;Aquatic Therapy;Cryotherapy;Electrical Stimulation;Ultrasound;Moist Heat;Iontophoresis 4mg /ml Dexamethasone;Functional mobility training;Therapeutic activities;Therapeutic exercise;Neuromuscular re-education;Manual techniques;Patient/family education;Taping;Dry needling    PT Next Visit Plan High symptom irritability; focus on UE ex like UBE, seated rows; mat ex's; aquatic ex;  graded exposure exercise    PT Home Exercise Plan [id number]           Patient will benefit from skilled therapeutic intervention in order to improve the following deficits and impairments:  Decreased range of motion,Difficulty walking,Increased fascial restricitons,Increased muscle spasms,Decreased activity tolerance,Impaired perceived functional ability,Pain,Decreased strength  Visit Diagnosis: Acute right-sided low back pain with right-sided sciatica  Muscle spasm of back  Muscle weakness (generalized)  Difficulty in walking, not elsewhere classified     Problem List Patient Active Problem List   Diagnosis Date Noted  . 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 03/09/2020  . 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 Intractable low back pain 03/09/2020  . Left hip pain 12/10/2018  . Osteopenia 05/11/2018  . Bursitis of hip 02/18/2017  . 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 GERD (gastroesophageal reflux disease) 08/04/2014  . Upper airway cough syndrome 08/04/2014  . Chest discomfort 05/25/2014   Ruben Im, PT 06/01/20 8:35 PM Phone: [phone] Fax: [phone] Alvera Singh 06/01/2020, 8:35 PM  [facility] [address] [place], [place], [address] Phone: [phone]   Fax:  [phone]  Name:  Taylor Sharp MRN: [id number] Date of Birth: 1960-11-29

## 2020-06-06 ENCOUNTER — Encounter: Payer: 59 | Admitting: Physical Therapy

## 2020-06-16 ENCOUNTER — Ambulatory Visit: Payer: 59 | Attending: Neurosurgery | Admitting: Physical Therapy

## 2020-06-16 ENCOUNTER — Other Ambulatory Visit: Payer: Self-pay

## 2020-06-16 DIAGNOSIS — R262 Difficulty in walking, not elsewhere classified: Secondary | ICD-10-CM | POA: Insufficient documentation

## 2020-06-16 DIAGNOSIS — M6281 Muscle weakness (generalized): Secondary | ICD-10-CM | POA: Diagnosis present

## 2020-06-16 DIAGNOSIS — M5441 Lumbago with sciatica, right side: Secondary | ICD-10-CM | POA: Insufficient documentation

## 2020-06-16 DIAGNOSIS — M62830 Muscle spasm of back: Secondary | ICD-10-CM | POA: Insufficient documentation

## 2020-06-16 NOTE — Therapy (Signed)
[facility] [address] [place], [place], [address] Phone: [phone]   Fax:  [phone]  Physical Therapy Treatment  Patient Details  Name: Taylor Sharp MRN: [id number] Date of Birth: February 18, 1961 Referring Provider (PT): Dr. Nikki Dom   Encounter Date: 06/16/2020   PT End of Session - 06/16/20 0958    Visit Number 9    Number of Visits 30    Date for PT Re-Evaluation 07/04/20    Authorization Type [employer] 30 visit limit    PT Start Time 0930    PT Stop Time 1015    PT Time Calculation (min) 45 min    Activity Tolerance Patient limited by pain           Past Medical History:  Diagnosis Date  . Asthma     Past Surgical History:  Procedure Laterality Date  . ABLATION    . IR ANGIO/SPINAL LEFT  03/11/2020  . IR ANGIO/SPINAL LEFT  03/11/2020  . IR ANGIO/SPINAL LEFT  03/11/2020  . IR ANGIO/SPINAL LEFT  03/11/2020  . IR ANGIO/SPINAL LEFT  03/11/2020  . IR ANGIO/SPINAL LEFT  03/11/2020  . IR ANGIO/SPINAL LEFT  03/11/2020  . IR ANGIO/SPINAL RIGHT  03/11/2020  . IR ANGIO/SPINAL RIGHT  03/11/2020  . IR ANGIO/SPINAL RIGHT  03/11/2020  . IR ANGIO/SPINAL RIGHT  03/11/2020  . IR ANGIO/SPINAL RIGHT  03/11/2020  . IR ANGIO/SPINAL RIGHT  03/11/2020  . IR ANGIOGRAM SELECTIVE EACH ADDITIONAL VESSEL  03/11/2020  . IR ANGIOGRAM SELECTIVE EACH ADDITIONAL VESSEL  03/11/2020  . IR US GUIDE VASC ACCESS RIGHT  03/11/2020  . RADIOLOGY WITH ANESTHESIA N/A 03/11/2020   Procedure: IR WITH ANESTHESIA;  Surgeon: Radiologist, Medication, MD;  Location: [place];  Service: Radiology;  Laterality: N/A;    There were no vitals filed for this visit.   Subjective Assessment - 06/16/20 0929    Subjective I worked a little at [employer].  My back hurts and then left posterior thigh pain.  OK in AM and then worse in PM.  I have an inversion table at home but I haven't done it.    Pertinent History Gabapentin and Robaxin;  no current restrictions;  typically  hypermobile; left hip bursitis; osteopenia    How long can you walk comfortably? < 1 mile Maybe 1/2 mile    Patient Stated Goals walk 100# dog; walk further and faster;  get back to work;  later return to tennis; later return to riding horses; keep up with daughter    Currently in Pain? Yes    Pain Score 6     Pain Location Back    Pain Orientation Right    Pain Radiating Towards right LE symptoms    Pain Relieving Factors lidocaine patch                             [facility] Adult PT Treatment/Exercise - 06/16/20 0001      Neuro Re-ed    Neuro Re-ed Details  pain education and brains role in pain      Lumbar Exercises: Standing   Other Standing Lumbar Exercises 15# bil rows at power tower 6x      Lumbar Exercises: Supine   Clam 10 reps    Clam Limitations red loop single and double    Other Supine Lumbar Exercises march on box 8x    Other Supine Lumbar Exercises ball squeeze 10x  Traction   Type of Traction Lumbar    Min (lbs) 35    Max (lbs) 70    Hold Time 45    Rest Time 15    Time 12                    PT Short Term Goals - 06/16/20 1003      PT SHORT TERM GOAL #1   Title The patient will be able to initiate a basic HEP for core and LE strengthening (especially) right LE to facilitate return to ADLs    Status Achieved      PT SHORT TERM GOAL #2   Title The patient will report a 30% improvement in back and right LE pain particularly at the end of the day and with household chores including doing the dishwasher    Period Weeks    Status On-going      PT SHORT TERM GOAL #3   Title FOTO functional score improved from 33% to 43% indicating improved function with less pain    Time 4    Period Weeks    Status On-going      PT SHORT TERM GOAL #4   Title The patient will be able to walk 8-10 minutes unassisted    Time 4    Period Weeks    Status On-going      PT SHORT TERM GOAL #5   Title The patient will have improved spinal flexion  to 60 degrees, extension to 20 degrees and bil sidebending to 20 degrees    Time 4    Period Weeks    Status On-going             PT Long Term Goals - 05/09/20 1734      PT LONG TERM GOAL #1   Title The patient will be independent with safe self progression of HEP    Time 8    Period Weeks    Status New    Target Date 07/04/20      PT LONG TERM GOAL #2   Title The patient will report a 60% improvement in back and right LE symptoms at the end of the day and with household chores    Time 8    Period Weeks    Status New      PT LONG TERM GOAL #3   Title The patient will have improved right LE and core strength grossly 4+/5 needed for reaching, sit to stand and walking longer distances    Time 8    Period Weeks    Status New      PT LONG TERM GOAL #4   Title The patient will demonstrate improved activity tolerance for sitting, standing and walking needed to work at the [employer] in April or future return to work at [employer]    Time 8    Period Weeks    Status New      PT [place] #5   Title FOTO functional outcome score improved to 57% indicating improved function with less pain    Time 8    Period Weeks    Status New                 Plan - 06/16/20 1159    Clinical Impression Statement Symptoms continue to be highly irritable in right LE and back therefore slower progress with rehab goals.   Best position is hooklying but even then exercise tolerance is  very limited.  She reports an exacerbation of back pain with standing rows.  Positive response to manual distraction of LEs so performed mechanical traction at a low intensity and intermittent pull.  She is monitored constantly for the duration with reports minimal right LE symptoms and "stretching sensation" to her back muscles.    Comorbidities osteopenia; hx of left hip bursitis; anxiety    Examination-Activity Limitations Locomotion Level;Bed Mobility;Reach Overhead;Bend;Caring for  Others;Carry;Sleep;Lift;Stand;Hygiene/Grooming;Squat    Rehab Potential Good    PT Frequency 2x / week    PT Duration 8 weeks    PT Treatment/Interventions ADLs/Self Care Home Management;Aquatic Therapy;Cryotherapy;Electrical Stimulation;Ultrasound;Moist Heat;Iontophoresis 4mg /ml Dexamethasone;Functional mobility training;Therapeutic activities;Therapeutic exercise;Neuromuscular re-education;Manual techniques;Patient/family education;Taping;Dry needling    PT Next Visit Plan assess response to lumbar traction;  High symptom irritability; focus on UE ex like UBE, seated rows; mat ex's; aquatic ex;  graded exposure exercise    PT Home Exercise Plan [id number]           Patient will benefit from skilled therapeutic intervention in order to improve the following deficits and impairments:  Decreased range of motion,Difficulty walking,Increased fascial restricitons,Increased muscle spasms,Decreased activity tolerance,Impaired perceived functional ability,Pain,Decreased strength  Visit Diagnosis: Acute right-sided low back pain with right-sided sciatica  Muscle spasm of back  Muscle weakness (generalized)  Difficulty in walking, not elsewhere classified     Problem List Patient Active Problem List   Diagnosis Date Noted  . 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 03/09/2020  . 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 Intractable low back pain 03/09/2020  . Left hip pain 12/10/2018  . Osteopenia 05/11/2018  . Bursitis of hip 02/18/2017  . 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 GERD (gastroesophageal reflux disease) 08/04/2014  . Upper airway cough syndrome 08/04/2014  . Chest discomfort 05/25/2014   Ruben Im, PT 06/16/20 12:06 PM Phone: [phone] Fax: [phone] Alvera Singh 06/16/2020, 12:05 PM  [facility] [address], [place], [place], [address] Phone: [phone]    Fax:  [phone]  Name: Corina Rasha Ibe MRN: [id number] Date of Birth: 1961/02/22

## 2020-06-20 ENCOUNTER — Encounter: Payer: 59 | Admitting: Physical Therapy

## 2020-06-22 ENCOUNTER — Encounter: Payer: 59 | Admitting: Physical Therapy

## 2020-06-28 ENCOUNTER — Encounter: Payer: Self-pay | Admitting: Physical Therapy

## 2020-06-28 ENCOUNTER — Other Ambulatory Visit: Payer: Self-pay

## 2020-06-28 ENCOUNTER — Ambulatory Visit: Payer: 59 | Admitting: Physical Therapy

## 2020-06-28 DIAGNOSIS — M6281 Muscle weakness (generalized): Secondary | ICD-10-CM

## 2020-06-28 DIAGNOSIS — M5441 Lumbago with sciatica, right side: Secondary | ICD-10-CM | POA: Diagnosis not present

## 2020-06-28 DIAGNOSIS — M62830 Muscle spasm of back: Secondary | ICD-10-CM

## 2020-06-28 DIAGNOSIS — R262 Difficulty in walking, not elsewhere classified: Secondary | ICD-10-CM

## 2020-06-28 NOTE — Therapy (Signed)
[facility] [address] [place], [place], [address] Phone: [phone]   Fax:  [phone]  Physical Therapy Treatment  Patient Details  Name: Taylor Sharp MRN: [id number] Date of Birth: 18-Aug-1960 Referring Provider (PT): Dr. Nikki Dom   Encounter Date: 06/28/2020   PT End of Session - 06/28/20 0841    Visit Number 10    Number of Visits 30    Date for PT Re-Evaluation 07/04/20    Authorization Type [employer] 30 visit limit    PT Start Time 0841    PT Stop Time 0943    PT Time Calculation (min) 62 min    Activity Tolerance Patient tolerated treatment well    Behavior During Therapy [facility] for tasks assessed/performed           Past Medical History:  Diagnosis Date  . Asthma     Past Surgical History:  Procedure Laterality Date  . ABLATION    . IR ANGIO/SPINAL LEFT  03/11/2020  . IR ANGIO/SPINAL LEFT  03/11/2020  . IR ANGIO/SPINAL LEFT  03/11/2020  . IR ANGIO/SPINAL LEFT  03/11/2020  . IR ANGIO/SPINAL LEFT  03/11/2020  . IR ANGIO/SPINAL LEFT  03/11/2020  . IR ANGIO/SPINAL LEFT  03/11/2020  . IR ANGIO/SPINAL RIGHT  03/11/2020  . IR ANGIO/SPINAL RIGHT  03/11/2020  . IR ANGIO/SPINAL RIGHT  03/11/2020  . IR ANGIO/SPINAL RIGHT  03/11/2020  . IR ANGIO/SPINAL RIGHT  03/11/2020  . IR ANGIO/SPINAL RIGHT  03/11/2020  . IR ANGIOGRAM SELECTIVE EACH ADDITIONAL VESSEL  03/11/2020  . IR ANGIOGRAM SELECTIVE EACH ADDITIONAL VESSEL  03/11/2020  . IR US GUIDE VASC ACCESS RIGHT  03/11/2020  . RADIOLOGY WITH ANESTHESIA N/A 03/11/2020   Procedure: IR WITH ANESTHESIA;  Surgeon: Radiologist, Medication, MD;  Location: [place];  Service: Radiology;  Laterality: N/A;    There were no vitals filed for this visit.   Subjective Assessment - 06/28/20 0842    Subjective Traction was "ok" i did have post thigh pain some hours later. I did try my inversion table but I went too far. I am hurting in my mid back back. I was sick last week and could not come to  therapy. I think my walks for exercise are getting a little better as long as I walk on flat terrain.    Currently in Pain? Yes    Pain Score 7     Pain Location Back    Pain Orientation Mid    Pain Descriptors / Indicators Sore    Aggravating Factors  doing to much    Pain Relieving Factors meds    Multiple Pain Sites No                             [facility] Adult PT Treatment/Exercise - 06/28/20 0001      Lumbar Exercises: Supine   Clam --   x5   Clam Limitations red loop single and double    Bent Knee Raise 5 reps   1 1/2 # on ankles, VC for core, 2 sets   Dead Bug 5 reps   holding 1# UE   Other Supine Lumbar Exercises 1# UE scissor arms 2x5 with core activation      Traction   Type of Traction Lumbar    Min (lbs) 35    Max (lbs) 70    Hold Time 40    Rest Time 20    Time  10                    PT Short Term Goals - 06/16/20 1003      PT SHORT TERM GOAL #1   Title The patient will be able to initiate a basic HEP for core and LE strengthening (especially) right LE to facilitate return to ADLs    Status Achieved      PT SHORT TERM GOAL #2   Title The patient will report a 30% improvement in back and right LE pain particularly at the end of the day and with household chores including doing the dishwasher    Period Weeks    Status On-going      PT SHORT TERM GOAL #3   Title FOTO functional score improved from 33% to 43% indicating improved function with less pain    Time 4    Period Weeks    Status On-going      PT SHORT TERM GOAL #4   Title The patient will be able to walk 8-10 minutes unassisted    Time 4    Period Weeks    Status On-going      PT SHORT TERM GOAL #5   Title The patient will have improved spinal flexion to 60 degrees, extension to 20 degrees and bil sidebending to 20 degrees    Time 4    Period Weeks    Status On-going             PT Long Term Goals - 05/09/20 1734      PT LONG TERM GOAL #1   Title The patient  will be independent with safe self progression of HEP    Time 8    Period Weeks    Status New    Target Date 07/04/20      PT LONG TERM GOAL #2   Title The patient will report a 60% improvement in back and right LE symptoms at the end of the day and with household chores    Time 8    Period Weeks    Status New      PT LONG TERM GOAL #3   Title The patient will have improved right LE and core strength grossly 4+/5 needed for reaching, sit to stand and walking longer distances    Time 8    Period Weeks    Status New      PT LONG TERM GOAL #4   Title The patient will demonstrate improved activity tolerance for sitting, standing and walking needed to work at the [employer] in April or future return to work at [employer]    Time 8    Period Weeks    Status New      PT [place] #5   Title FOTO functional outcome score improved to 57% indicating improved function with less pain    Time 8    Period [place] - 06/28/20 2130    Clinical Impression Statement Pt appeared to tolerate hooklying exercises ok, some increased Rt foot tingling upon sitting up. Pt tried her inversion table but unfortunately she went almost vertical and did not tolerate that very well. PTA suggested trying only a slight tilt of maybe 20-30 degrees to start for a few minutes. pt verbally agreed. Pt did want to try the mechanical traction again in the  clinic, parameters were kept pretty much the same today. Pt does require extra time for all transitions and time between exericses during her session and especially getting off the traction table. Pt reported no increased symptoms post session today. Pt cannot keep her aquatic session this friday as she will be out of town.    Personal Factors and Comorbidities Comorbidity 1;Profession;Comorbidity 2;Comorbidity 3+;Past/Current Experience    Comorbidities osteopenia; hx of left hip bursitis; anxiety    Examination-Activity  Limitations Locomotion Level;Bed Mobility;Reach Overhead;Bend;Caring for Others;Carry;Sleep;Lift;Stand;Hygiene/Grooming;Squat    Examination-Participation Restrictions Meal Prep;Cleaning;Occupation;Community Activity;Driving;Laundry    Stability/Clinical Decision Making Evolving/Moderate complexity    Rehab Potential Good    PT Frequency 2x / week    PT Duration 8 weeks    PT Treatment/Interventions ADLs/Self Care Home Management;Aquatic Therapy;Cryotherapy;Electrical Stimulation;Ultrasound;Moist Heat;Iontophoresis 4mg /ml Dexamethasone;Functional mobility training;Therapeutic activities;Therapeutic exercise;Neuromuscular re-education;Manual techniques;Patient/family education;Taping;Dry needling    PT Next Visit Plan assess response to lumbar traction;  High symptom irritability; focus on UE ex like UBE, seated rows; mat ex's; aquatic ex;  graded exposure exercise    PT Home Exercise Plan [id number]    Consulted and Agree with Plan of Care Patient           Patient will benefit from skilled therapeutic intervention in order to improve the following deficits and impairments:  Decreased range of motion,Difficulty walking,Increased fascial restricitons,Increased muscle spasms,Decreased activity tolerance,Impaired perceived functional ability,Pain,Decreased strength  Visit Diagnosis: Acute right-sided low back pain with right-sided sciatica  Muscle spasm of back  Muscle weakness (generalized)  Difficulty in walking, not elsewhere classified     Problem List Patient Active Problem List   Diagnosis Date Noted  . 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 03/09/2020  . 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 Intractable low back pain 03/09/2020  . Left hip pain 12/10/2018  . Osteopenia 05/11/2018  . Bursitis of hip 02/18/2017  . 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 GERD (gastroesophageal reflux disease) 08/04/2014  . Upper airway cough syndrome  08/04/2014  . Chest discomfort 05/25/2014    Lacreshia Bondarenko, PTA 06/28/2020, 9:47 AM  [facility] [address] [place], [place], [address] Phone: [phone]   Fax:  [phone]  Name: Desirie Minteer MRN: [id number] Date of Birth: [birth year]

## 2020-06-30 ENCOUNTER — Ambulatory Visit: Payer: 59 | Admitting: Physical Therapy

## 2020-07-04 ENCOUNTER — Ambulatory Visit: Payer: 59 | Admitting: Physical Therapy

## 2020-07-04 ENCOUNTER — Other Ambulatory Visit: Payer: Self-pay

## 2020-07-04 DIAGNOSIS — M5441 Lumbago with sciatica, right side: Secondary | ICD-10-CM | POA: Diagnosis not present

## 2020-07-04 DIAGNOSIS — M6281 Muscle weakness (generalized): Secondary | ICD-10-CM

## 2020-07-04 DIAGNOSIS — R262 Difficulty in walking, not elsewhere classified: Secondary | ICD-10-CM

## 2020-07-04 DIAGNOSIS — M62830 Muscle spasm of back: Secondary | ICD-10-CM

## 2020-07-04 NOTE — Therapy (Signed)
[facility] [address] [place], [place], [address] Phone: [phone]   Fax:  [phone]  Physical Therapy Treatment/Recertification   Patient Details  Name: Taylor Sharp MRN: [id number] Date of Birth: 1960/07/29 Referring Provider (PT): Dr. Nikki Dom   Encounter Date: 07/04/2020   PT End of Session - 07/04/20 1720    Visit Number 11    Number of Visits 30    Date for PT Re-Evaluation 08/29/20    Authorization Type [employer] 30 visit limit    PT Start Time 0845    PT Stop Time 0932    PT Time Calculation (min) 47 min    Activity Tolerance Patient tolerated treatment well           Past Medical History:  Diagnosis Date  . Asthma     Past Surgical History:  Procedure Laterality Date  . ABLATION    . IR ANGIO/SPINAL LEFT  03/11/2020  . IR ANGIO/SPINAL LEFT  03/11/2020  . IR ANGIO/SPINAL LEFT  03/11/2020  . IR ANGIO/SPINAL LEFT  03/11/2020  . IR ANGIO/SPINAL LEFT  03/11/2020  . IR ANGIO/SPINAL LEFT  03/11/2020  . IR ANGIO/SPINAL LEFT  03/11/2020  . IR ANGIO/SPINAL RIGHT  03/11/2020  . IR ANGIO/SPINAL RIGHT  03/11/2020  . IR ANGIO/SPINAL RIGHT  03/11/2020  . IR ANGIO/SPINAL RIGHT  03/11/2020  . IR ANGIO/SPINAL RIGHT  03/11/2020  . IR ANGIO/SPINAL RIGHT  03/11/2020  . IR ANGIOGRAM SELECTIVE EACH ADDITIONAL VESSEL  03/11/2020  . IR ANGIOGRAM SELECTIVE EACH ADDITIONAL VESSEL  03/11/2020  . IR US GUIDE VASC ACCESS RIGHT  03/11/2020  . RADIOLOGY WITH ANESTHESIA N/A 03/11/2020   Procedure: IR WITH ANESTHESIA;  Surgeon: Radiologist, Medication, MD;  Location: [place];  Service: Radiology;  Laterality: N/A;    There were no vitals filed for this visit.   Subjective Assessment - 07/04/20 0850    Subjective The traction feels good when I first get up but it changes the leg pain, I can it in different places afterwards.  I would like to continue it.  I've been to a few social events like book club.  I can do the dishwasher now but not  vacumn.  I sometimes need help lifting overhead.  Need railings on the stairs.    Pertinent History Gabapentin and Robaxin;  no current restrictions;  typically hypermobile; left hip bursitis; osteopenia    Limitations House hold activities;Walking;Lifting    How long can you walk comfortably? with somebody I can go further and do hills;  yesterday I did 3 laps around 2 different cul de sacs    Patient Stated Goals walk 100# dog; walk further and faster;  get back to work;  later return to tennis; later return to riding horses; keep up with daughter    Currently in Pain? Yes    Pain Score 5     Pain Location Back    Pain Type Acute pain              [facility] PT Assessment - 07/04/20 0001      Observation/Other Assessments   Focus on Therapeutic Outcomes (FOTO)  44%      AROM   Lumbar Flexion 40    Lumbar Extension 10    Lumbar - Right Side Bend 20    Lumbar - Left Side Bend 20      Strength   Overall Strength Comments able to rise sit to stand without UE assist 1x  Right Ankle Dorsiflexion 4-/5    Right Ankle Plantar Flexion 4/5    Lumbar Flexion 4-/5    Lumbar Extension 4-/5                         [facility] Adult PT Treatment/Exercise - 07/04/20 0001      Therapeutic Activites    Other Therapeutic Activities discussed 2 short walks/day and tracking time; save longer walks for when someone is with her      Lumbar Exercises: Seated   Other Seated Lumbar Exercises in chair power tower 20# 15x      Lumbar Exercises: Supine   Bent Knee Raise 5 reps    Isometric Hip Flexion 5 reps;5 seconds      Knee/Hip Exercises: Standing   Forward Step Up Right;Left;1 set;5 reps      Traction   Type of Traction Lumbar    Min (lbs) 45    Max (lbs) 75    Hold Time 45    Rest Time 15    Time 12                    PT Short Term Goals - 07/04/20 1730      PT SHORT TERM GOAL #1   Title The patient will be able to initiate a basic HEP for core and LE  strengthening (especially) right LE to facilitate return to ADLs    Status Achieved      PT SHORT TERM GOAL #2   Title The patient will report a 30% improvement in back and right LE pain particularly at the end of the day and with household chores including doing the dishwasher    Status Achieved      PT SHORT TERM GOAL #3   Title FOTO functional score improved from 33% to 43% indicating improved function with less pain    Status Achieved      PT SHORT TERM GOAL #4   Title The patient will be able to walk 8-10 minutes unassisted    Status Achieved      PT SHORT TERM GOAL #5   Title The patient will have improved spinal flexion to 60 degrees, extension to 20 degrees and bil sidebending to 20 degrees    Status Partially Met             PT Long Term Goals - 07/04/20 1730      PT LONG TERM GOAL #1   Title The patient will be independent with safe self progression of HEP    Time 8    Period Weeks    Status On-going    Target Date 08/29/20      PT LONG TERM GOAL #2   Title The patient will report a 60% improvement in back and right LE symptoms at the end of the day and with household chores    Time 8    Period Weeks    Status On-going      PT LONG TERM GOAL #3   Title The patient will have improved right LE and core strength grossly 4+/5 needed for reaching, sit to stand and walking longer distances    Time 8    Period Weeks    Status On-going      PT LONG TERM GOAL #4   Title The patient will demonstrate improved activity tolerance for sitting, standing and walking 20-30 minutes    Time 8    Period Weeks  Status Revised      PT LONG TERM GOAL #5   Title FOTO functional outcome score improved to 57% indicating improved function with less pain    Time 8    Period Weeks    Status On-going                 Plan - 07/04/20 1723    Clinical Impression Statement The patient rates her overall progress at 50%. She reports functional improvements with loading the  dishwasher and attending some social gatherings but still limited with strength to do stairs without railings, vacumning and walking longer distances.  Her gait speed and transitional movements are improving.  Her FOTO score has improved from 33% (highly impaired) to 44%.  She continues to be limited by right LE pain as well as back pain especially with sitting.  She reports short term relief with mechanical traction however LE symptoms do not fully resolve.  She would benefit from continued aquatic PT for the benefits of decompression in addition to functional strengthening and pain relieving modalities in the clinic.    Personal Factors and Comorbidities Comorbidity 1;Profession;Comorbidity 2;Comorbidity 3+;Past/Current Experience    Comorbidities osteopenia; hx of left hip bursitis; anxiety    Examination-Activity Limitations Locomotion Level;Bed Mobility;Reach Overhead;Bend;Caring for Others;Carry;Sleep;Lift;Stand;Hygiene/Grooming;Squat    Examination-Participation Restrictions Meal Prep;Cleaning;Occupation;Community Activity;Driving;Laundry    Stability/Clinical Decision Making Evolving/Moderate complexity    Clinical Decision Making Moderate    Rehab Potential Good    PT Frequency 2x / week    PT Duration 8 weeks    PT Treatment/Interventions ADLs/Self Care Home Management;Aquatic Therapy;Cryotherapy;Electrical Stimulation;Ultrasound;Moist Heat;Iontophoresis 14m/ml Dexamethasone;Functional mobility training;Therapeutic activities;Therapeutic exercise;Neuromuscular re-education;Manual techniques;Patient/family education;Taping;Dry needling    PT Next Visit Plan lumbar traction;  High symptom irritability; seated rows; mat ex's; aquatic ex;  graded exposure exercise, low repetition step ups; reaching up; staggered stance rocking to simulate vacumning    PT Home Exercise Plan [id number]           Patient will benefit from skilled therapeutic intervention in order to improve the following deficits  and impairments:  Decreased range of motion,Difficulty walking,Increased fascial restricitons,Increased muscle spasms,Decreased activity tolerance,Impaired perceived functional ability,Pain,Decreased strength  Visit Diagnosis: Acute right-sided low back pain with right-sided sciatica - Plan: PT plan of care cert/re-cert  Muscle spasm of back - Plan: PT plan of care cert/re-cert  Muscle weakness (generalized) - Plan: PT plan of care cert/re-cert  Difficulty in walking, not elsewhere classified - Plan: PT plan of care cert/re-cert     Problem List Patient Active Problem List   Diagnosis Date Noted  . 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 03/09/2020  . [place] 03/09/2020  . Intractable low back pain 03/09/2020  . Left hip pain 12/10/2018  . Osteopenia 05/11/2018  . Bursitis of hip 02/18/2017  . 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 GERD (gastroesophageal reflux disease) 08/04/2014  . Upper airway cough syndrome 08/04/2014  . Chest discomfort 05/25/2014   SRuben Im PT 07/04/20 5:34 PM Phone: [phone]: [phone] Singh4/26/2022, 5:33 PM  [facility] [address] [place] [address]Phone: [phone]  Fax:  [phone] Name: Taylor CasadMRN: [id number] of Birth: [birth year]

## 2020-07-06 ENCOUNTER — Other Ambulatory Visit: Payer: Self-pay

## 2020-07-06 ENCOUNTER — Ambulatory Visit: Payer: 59 | Admitting: Physical Therapy

## 2020-07-06 DIAGNOSIS — M6281 Muscle weakness (generalized): Secondary | ICD-10-CM

## 2020-07-06 DIAGNOSIS — M5441 Lumbago with sciatica, right side: Secondary | ICD-10-CM

## 2020-07-06 DIAGNOSIS — R262 Difficulty in walking, not elsewhere classified: Secondary | ICD-10-CM

## 2020-07-06 DIAGNOSIS — M62830 Muscle spasm of back: Secondary | ICD-10-CM

## 2020-07-06 NOTE — Therapy (Signed)
[facility] [address], [place], [place], [address] Phone: [phone]   Fax:  [phone]  Physical Therapy Treatment  Patient Details  Name: Taylor Sharp MRN: [id number] Date of Birth: 1960/12/12 Referring Provider (PT): Dr. Nikki Dom   Encounter Date: 07/06/2020   PT End of Session - 07/06/20 1845    Visit Number 12    Number of Visits 30    Date for PT Re-Evaluation 08/29/20    Authorization Type [employer] 30 visit limit    PT Start Time 0845    PT Stop Time 0930    PT Time Calculation (min) 45 min    Activity Tolerance Patient limited by pain           Past Medical History:  Diagnosis Date  . Asthma     Past Surgical History:  Procedure Laterality Date  . ABLATION    . IR ANGIO/SPINAL LEFT  03/11/2020  . IR ANGIO/SPINAL LEFT  03/11/2020  . IR ANGIO/SPINAL LEFT  03/11/2020  . IR ANGIO/SPINAL LEFT  03/11/2020  . IR ANGIO/SPINAL LEFT  03/11/2020  . IR ANGIO/SPINAL LEFT  03/11/2020  . IR ANGIO/SPINAL LEFT  03/11/2020  . IR ANGIO/SPINAL RIGHT  03/11/2020  . IR ANGIO/SPINAL RIGHT  03/11/2020  . IR ANGIO/SPINAL RIGHT  03/11/2020  . IR ANGIO/SPINAL RIGHT  03/11/2020  . IR ANGIO/SPINAL RIGHT  03/11/2020  . IR ANGIO/SPINAL RIGHT  03/11/2020  . IR ANGIOGRAM SELECTIVE EACH ADDITIONAL VESSEL  03/11/2020  . IR ANGIOGRAM SELECTIVE EACH ADDITIONAL VESSEL  03/11/2020  . IR US GUIDE VASC ACCESS RIGHT  03/11/2020  . RADIOLOGY WITH ANESTHESIA N/A 03/11/2020   Procedure: IR WITH ANESTHESIA;  Surgeon: Radiologist, Medication, MD;  Location: [place];  Service: Radiology;  Laterality: N/A;    There were no vitals filed for this visit.   Subjective Assessment - 07/06/20 0849    Subjective My back is really sore.  I rode in the car to [place] to see my daughter and then came back the next day.  I may not want to do traction today.    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 Yes    Pain Score 6     Pain Location Back    Pain Orientation Mid    Pain Type Acute pain                             [facility] Adult PT Treatment/Exercise - 07/06/20 0001      Lumbar Exercises: Stretches   Single Knee to Chest Stretch Right;Left;3 reps    Lower Trunk Rotation 5 reps      Lumbar Exercises: Aerobic   Nustep 6 min seat 9      Lumbar Exercises: Standing   Other Standing Lumbar Exercises weight shift rebounder side to side and forward and back 1 min each      Lumbar Exercises: Supine   Bridge 10 reps    Other Supine Lumbar Exercises red band bil UE horizontal abduction and sash 10x each    Other Supine Lumbar Exercises red band overhead  lat pull downs 10x      Moist Heat Therapy   Number Minutes Moist Heat 10 Minutes   concurrent with ex   Moist Heat Location Lumbar Spine      Manual Therapy   Soft tissue mobilization Addaday instrument assisted soft tissue mobilization gluteals, HS, ITB, gastroc                    PT Short Term Goals - 07/04/20 1730      PT SHORT TERM GOAL #1   Title The patient will be able to initiate a basic HEP for core and LE strengthening (especially) right LE to facilitate return to ADLs    Status Achieved      PT SHORT TERM GOAL #2   Title The patient will report a 30% improvement in back and right LE pain particularly at the end of the day and with household chores including doing the dishwasher    Status Achieved      PT SHORT TERM GOAL #3   Title FOTO functional score improved from 33% to 43% indicating improved function with less pain    Status Achieved      PT SHORT TERM GOAL #4   Title The patient will be able to walk 8-10 minutes  unassisted    Status Achieved      PT SHORT TERM GOAL #5   Title The patient will have improved spinal flexion to 60 degrees, extension to 20 degrees and bil sidebending to 20 degrees    Status Partially Met             PT Long Term Goals - 07/04/20 1730      PT LONG TERM GOAL #1   Title The patient will be independent with safe self progression of HEP    Time 8    Period Weeks    Status On-going    Target Date 08/29/20      PT LONG TERM GOAL #2   Title The patient will report a 60% improvement in back and right LE symptoms at the end of the day and with household chores    Time 8    Period Weeks    Status On-going      PT LONG TERM GOAL #3   Title The patient will have improved right LE and core strength grossly 4+/5 needed for reaching, sit to stand and walking longer distances    Time 8    Period Weeks    Status On-going      PT LONG TERM GOAL #4   Title The patient will demonstrate improved activity tolerance for sitting, standing and walking 20-30 minutes    Time 8    Period Weeks    Status Revised      PT LONG TERM GOAL #5   Title FOTO functional outcome score improved to 57% indicating improved function with less pain    Time 8    Period Weeks    Status On-going                 Plan - 07/06/20 1846    Clinical Impression Statement Despite complaints of increased mid and low back pain, she is able to continue with graded exposure exercise with moist heat for added comfort.  High sensitivity to Addaday instrument assisted soft tissue mobilization particularly to lower leg.  Therapist closely monitoring response to all and modifying accordingly.    Personal Factors and Comorbidities Comorbidity 1;Profession;Comorbidity 2;Comorbidity 3+;Past/Current  Experience    Comorbidities osteopenia; hx of left hip bursitis; anxiety    Examination-Activity Limitations Locomotion Level;Bed Mobility;Reach Overhead;Bend;Caring for  Others;Carry;Sleep;Lift;Stand;Hygiene/Grooming;Squat    Rehab Potential Good    PT Frequency 2x / week    PT Duration 8 weeks    PT Next Visit Plan High symptom irritability; seated rows; mat ex's; aquatic ex;  graded exposure exercise, low repetition step ups; reaching up; staggered stance rocking to simulate vacumning    PT Home Exercise Plan [id number]           Patient will benefit from skilled therapeutic intervention in order to improve the following deficits and impairments:  Decreased range of motion,Difficulty walking,Increased fascial restricitons,Increased muscle spasms,Decreased activity tolerance,Impaired perceived functional ability,Pain,Decreased strength  Visit Diagnosis: Acute right-sided low back pain with right-sided sciatica  Muscle spasm of back  Muscle weakness (generalized)  Difficulty in walking, not elsewhere classified     Problem List Patient Active Problem List   Diagnosis Date Noted  . 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 03/09/2020  . 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 Intractable low back pain 03/09/2020  . Left hip pain 12/10/2018  . Osteopenia 05/11/2018  . Bursitis of hip 02/18/2017  . 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 GERD (gastroesophageal reflux disease) 08/04/2014  . Upper airway cough syndrome 08/04/2014  . Chest discomfort 05/25/2014   Ruben Im, PT 07/06/20 6:55 PM Phone: [phone] Fax: [phone]  Alvera Singh 07/06/2020, 6:54 PM  [facility] [address], [place], [place], [address] Phone: [phone]   Fax:  [phone]  Name: Jenice Leiner MRN: [id number] Date of Birth: 01/22/1961

## 2020-07-11 ENCOUNTER — Ambulatory Visit: Payer: 59 | Attending: Neurosurgery | Admitting: Physical Therapy

## 2020-07-11 ENCOUNTER — Other Ambulatory Visit: Payer: Self-pay

## 2020-07-11 DIAGNOSIS — M5441 Lumbago with sciatica, right side: Secondary | ICD-10-CM

## 2020-07-11 DIAGNOSIS — M6281 Muscle weakness (generalized): Secondary | ICD-10-CM

## 2020-07-11 DIAGNOSIS — M62830 Muscle spasm of back: Secondary | ICD-10-CM

## 2020-07-11 DIAGNOSIS — R262 Difficulty in walking, not elsewhere classified: Secondary | ICD-10-CM

## 2020-07-11 NOTE — Therapy (Signed)
[facility] [address] [place], [place], [address] Phone: [phone]   Fax:  [phone]  Physical Therapy Treatment  Patient Details  Name: Taylor Sharp MRN: [id number] Date of Birth: 12-Oct-1960 Referring Provider (PT): Dr. Nikki Dom   Encounter Date: 07/11/2020   PT End of Session - 07/11/20 2019    Visit Number 13    Number of Visits 30    Date for PT Re-Evaluation 08/29/20    Authorization Type [employer] 30 visit limit    PT Start Time 0845    PT Stop Time 0920   treatment ended early in order to call MD office   PT Time Calculation (min) 35 min    Activity Tolerance Patient limited by pain           Past Medical History:  Diagnosis Date  . Asthma     Past Surgical History:  Procedure Laterality Date  . ABLATION    . IR ANGIO/SPINAL LEFT  03/11/2020  . IR ANGIO/SPINAL LEFT  03/11/2020  . IR ANGIO/SPINAL LEFT  03/11/2020  . IR ANGIO/SPINAL LEFT  03/11/2020  . IR ANGIO/SPINAL LEFT  03/11/2020  . IR ANGIO/SPINAL LEFT  03/11/2020  . IR ANGIO/SPINAL LEFT  03/11/2020  . IR ANGIO/SPINAL RIGHT  03/11/2020  . IR ANGIO/SPINAL RIGHT  03/11/2020  . IR ANGIO/SPINAL RIGHT  03/11/2020  . IR ANGIO/SPINAL RIGHT  03/11/2020  . IR ANGIO/SPINAL RIGHT  03/11/2020  . IR ANGIO/SPINAL RIGHT  03/11/2020  . IR ANGIOGRAM SELECTIVE EACH ADDITIONAL VESSEL  03/11/2020  . IR ANGIOGRAM SELECTIVE EACH ADDITIONAL VESSEL  03/11/2020  . IR US GUIDE VASC ACCESS RIGHT  03/11/2020  . RADIOLOGY WITH ANESTHESIA N/A 03/11/2020   Procedure: IR WITH ANESTHESIA;  Surgeon: Radiologist, Medication, MD;  Location: [place];  Service: Radiology;  Laterality: N/A;    There were no vitals filed for this visit.   Subjective Assessment - 07/11/20 0844    Subjective I went to visit my sister in [place].  My husband drove me.  I had to lie down when I got there.  My leg bothers me in different places.  I did walk yesterday.  I feel like my leg weakness is getting worse.   Reports some bowel incontinence.  Decreased right LE senstation; back weakness ( I feel like I'm going to fall backwards)    Pertinent History Gabapentin and Robaxin;  no current restrictions;  typically hypermobile; left hip bursitis; osteopenia    How long can you walk comfortably? 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 Yes    Pain Score 6     Pain Location Back    Pain Orientation Mid                             [facility] Adult PT Treatment/Exercise - 07/11/20 0001      Lumbar Exercises: Stretches   Lower Trunk Rotation 5 reps    Lower Trunk Rotation Limitations with LEs on green ball      Lumbar Exercises: Supine   Ab Set 10  reps    Heel Slides Limitations HS sets on green ball 5    Bridge 5 reps    Bridge Limitations Legs straight on green ball    Other Supine Lumbar Exercises UE bil shoulder extension pushing isometrically into the mat 5x                    PT Short Term Goals - 07/04/20 1730      PT SHORT TERM GOAL #1   Title The patient will be able to initiate a basic HEP for core and LE strengthening (especially) right LE to facilitate return to ADLs    Status Achieved      PT SHORT TERM GOAL #2   Title The patient will report a 30% improvement in back and right LE pain particularly at the end of the day and with household chores including doing the dishwasher    Status Achieved      PT SHORT TERM GOAL #3   Title FOTO functional score improved from 33% to 43% indicating improved function with less pain    Status Achieved      PT SHORT TERM GOAL #4   Title The patient will be able to walk 8-10 minutes unassisted    Status Achieved       PT SHORT TERM GOAL #5   Title The patient will have improved spinal flexion to 60 degrees, extension to 20 degrees and bil sidebending to 20 degrees    Status Partially Met             PT Long Term Goals - 07/04/20 1730      PT LONG TERM GOAL #1   Title The patient will be independent with safe self progression of HEP    Time 8    Period Weeks    Status On-going    Target Date 08/29/20      PT LONG TERM GOAL #2   Title The patient will report a 60% improvement in back and right LE symptoms at the end of the day and with household chores    Time 8    Period Weeks    Status On-going      PT LONG TERM GOAL #3   Title The patient will have improved right LE and core strength grossly 4+/5 needed for reaching, sit to stand and walking longer distances    Time 8    Period Weeks    Status On-going      PT LONG TERM GOAL #4   Title The patient will demonstrate improved activity tolerance for sitting, standing and walking 20-30 minutes    Time 8    Period Weeks    Status Revised      PT LONG TERM GOAL #5   Title FOTO functional outcome score improved to 57% indicating improved function with less pain    Time 8    Period Weeks    Status On-going                 Plan - 07/11/20 2020    Clinical Impression Statement Discussed at length her continued severe back pain, right LE pain, decreased sensation in multiple dermatomes, strength loss and report of bowel incontinence (she states she's had some issues in the past but now much worse).  Pt agreed with the plan to notify Dr. Zannie Cove office regarding these symptoms.  I called and spoke with the triage nurse and she plans to discuss with the team  to determine next steps.    Shortened treatment session in positions of comfort (supine with knees bent) with few reps.    Personal Factors and Comorbidities Comorbidity 1;Profession;Comorbidity 2;Comorbidity 3+;Past/Current Experience    Comorbidities osteopenia; hx of left hip  bursitis; anxiety    Examination-Activity Limitations Locomotion Level;Bed Mobility;Reach Overhead;Bend;Caring for Others;Carry;Sleep;Lift;Stand;Hygiene/Grooming;Squat    Stability/Clinical Decision Making Evolving/Moderate complexity    Rehab Potential Good    PT Frequency 2x / week    PT Duration 8 weeks    PT Treatment/Interventions ADLs/Self Care Home Management;Aquatic Therapy;Cryotherapy;Electrical Stimulation;Ultrasound;Moist Heat;Iontophoresis 41m/ml Dexamethasone;Functional mobility training;Therapeutic activities;Therapeutic exercise;Neuromuscular re-education;Manual techniques;Patient/family education;Taping;Dry needling    PT Next Visit Plan await input from MD office regarding neuro symptoms;  High symptom irritability; seated rows; mat ex's; aquatic ex;  graded exposure exercise, low repetition step ups; reaching up; staggered stance rocking to simulate vacumning           Patient will benefit from skilled therapeutic intervention in order to improve the following deficits and impairments:  Decreased range of motion,Difficulty walking,Increased fascial restricitons,Increased muscle spasms,Decreased activity tolerance,Impaired perceived functional ability,Pain,Decreased strength  Visit Diagnosis: Acute right-sided low back pain with right-sided sciatica  Muscle spasm of back  Muscle weakness (generalized)  Difficulty in walking, not elsewhere classified     Problem List Patient Active Problem List   Diagnosis Date Noted  . 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 03/09/2020  . 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 Intractable low back pain 03/09/2020  . Left hip pain 12/10/2018  . Osteopenia 05/11/2018  . Bursitis of hip 02/18/2017  . 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 GERD (gastroesophageal reflux disease) 08/04/2014  . Upper airway cough syndrome 08/04/2014  . Chest discomfort 05/25/2014   SRuben Im PT 07/11/20 8:30  PM Phone: [phone]: [phone] SAlvera Singh5/05/2020, 8:30 PM  [facility] [address] [place] [address]: [phone]  Fax:  [phone] Name: RSyrah DaughtreyMRN: [id number] of Birth: 106-Feb-1962

## 2020-07-14 ENCOUNTER — Other Ambulatory Visit: Payer: Self-pay

## 2020-07-14 ENCOUNTER — Encounter: Payer: Self-pay | Admitting: Physical Therapy

## 2020-07-14 ENCOUNTER — Ambulatory Visit: Payer: 59 | Admitting: Physical Therapy

## 2020-07-14 DIAGNOSIS — M5441 Lumbago with sciatica, right side: Secondary | ICD-10-CM

## 2020-07-14 DIAGNOSIS — M6281 Muscle weakness (generalized): Secondary | ICD-10-CM

## 2020-07-14 DIAGNOSIS — M62830 Muscle spasm of back: Secondary | ICD-10-CM

## 2020-07-14 NOTE — Therapy (Signed)
[facility] [address], [place], [place], [address] Phone: [phone]   Fax:  [phone]  Physical Therapy Treatment  Patient Details  Name: Taylor Sharp MRN: [id number] Date of Birth: January 11, 1961 Referring Provider (PT): Dr. Nikki Dom   Encounter Date: 07/14/2020   PT End of Session - 07/14/20 1539    Visit Number 14    Number of Visits 30    Date for PT Re-Evaluation 08/29/20    Authorization Type [employer] 30 visit limit    PT Start Time 1215    PT Stop Time 1300    PT Time Calculation (min) 45 min    Activity Tolerance Patient limited by pain    Behavior During Therapy [facility] for tasks assessed/performed           Past Medical History:  Diagnosis Date  . Asthma     Past Surgical History:  Procedure Laterality Date  . ABLATION    . IR ANGIO/SPINAL LEFT  03/11/2020  . IR ANGIO/SPINAL LEFT  03/11/2020  . IR ANGIO/SPINAL LEFT  03/11/2020  . IR ANGIO/SPINAL LEFT  03/11/2020  . IR ANGIO/SPINAL LEFT  03/11/2020  . IR ANGIO/SPINAL LEFT  03/11/2020  . IR ANGIO/SPINAL LEFT  03/11/2020  . IR ANGIO/SPINAL RIGHT  03/11/2020  . IR ANGIO/SPINAL RIGHT  03/11/2020  . IR ANGIO/SPINAL RIGHT  03/11/2020  . IR ANGIO/SPINAL RIGHT  03/11/2020  . IR ANGIO/SPINAL RIGHT  03/11/2020  . IR ANGIO/SPINAL RIGHT  03/11/2020  . IR ANGIOGRAM SELECTIVE EACH ADDITIONAL VESSEL  03/11/2020  . IR ANGIOGRAM SELECTIVE EACH ADDITIONAL VESSEL  03/11/2020  . IR US GUIDE VASC ACCESS RIGHT  03/11/2020  . RADIOLOGY WITH ANESTHESIA N/A 03/11/2020   Procedure: IR WITH ANESTHESIA;  Surgeon: Radiologist, Medication, MD;  Location: [place];  Service: Radiology;  Laterality: N/A;    There were no vitals filed for this visit.   Subjective Assessment - 07/14/20 1541    Subjective I am doing ok today. Mid back pain 3-4/10, intermittent RTLE symptoms.    Pertinent History Gabapentin and Robaxin;  no current restrictions;  typically hypermobile; left hip bursitis; osteopenia     Currently in Pain? Yes   mid back sore 3-4/10   Aggravating Factors  overdoing it    Pain Relieving Factors meds    Multiple Pain Sites No          Treatment: Aquatics Patient seen for aquatic therapy today.  Treatment took place in water 3.5-4.5 feet deep depending upon activity.  Pt entered the pool via stairs slowly with heavy use of the hand rails.  Seated water bench with 75% submersion Pt performed seated LE AROM exercises 20x in all planes, Floatation front plank off the water bench.  Water walking: spotlight to spotlight 4x in each direction. Some RT inner gastroc irritation. Various positions tried to less back pain and/or RTLE symptoms using the thick noodle and other floatation devices. Pt has most success using the thickest noodle behind her and almost sitting in a chair position and then putting noodle in front of her and bringing [place] AKA Monkey Stretch.                              PT Short Term Goals - 07/04/20 1730      PT SHORT TERM GOAL #1   Title The patient will be able to initiate a basic HEP for core and LE strengthening (especially)  right LE to facilitate return to ADLs    Status Achieved      PT SHORT TERM GOAL #2   Title The patient will report a 30% improvement in back and right LE pain particularly at the end of the day and with household chores including doing the dishwasher    Status Achieved      PT SHORT TERM GOAL #3   Title FOTO functional score improved from 33% to 43% indicating improved function with less pain    Status Achieved      PT SHORT TERM GOAL #4   Title The patient will be able to walk 8-10 minutes unassisted    Status Achieved      PT SHORT TERM GOAL #5   Title The patient will have improved spinal flexion to 60 degrees, extension to 20 degrees and bil sidebending to 20 degrees    Status Partially Met             PT Long Term Goals - 07/04/20 1730      PT LONG TERM GOAL #1   Title The patient will  be independent with safe self progression of HEP    Time 8    Period Weeks    Status On-going    Target Date 08/29/20      PT LONG TERM GOAL #2   Title The patient will report a 60% improvement in back and right LE symptoms at the end of the day and with household chores    Time 8    Period Weeks    Status On-going      PT LONG TERM GOAL #3   Title The patient will have improved right LE and core strength grossly 4+/5 needed for reaching, sit to stand and walking longer distances    Time 8    Period Weeks    Status On-going      PT LONG TERM GOAL #4   Title The patient will demonstrate improved activity tolerance for sitting, standing and walking 20-30 minutes    Time 8    Period Weeks    Status Revised      PT LONG TERM GOAL #5   Title FOTO functional outcome score improved to 57% indicating improved function with less pain    Time 8    Period Weeks    Status On-going                 Plan - 07/14/20 1544    Clinical Impression Statement Pt's pain in her mid back or the RTLE symptoms do increase in the pool the more she moves, She tolerated walking better than she has in the past but mostly finding positions to decompress the spine, gentle stretch and lessen pain was the primary goal. Pt was able to get out of the pool easier today.    Personal Factors and Comorbidities Comorbidity 1;Profession;Comorbidity 2;Comorbidity 3+;Past/Current Experience    Comorbidities osteopenia; hx of left hip bursitis; anxiety    Examination-Activity Limitations Locomotion Level;Bed Mobility;Reach Overhead;Bend;Caring for Others;Carry;Sleep;Lift;Stand;Hygiene/Grooming;Squat    Examination-Participation Restrictions Meal Prep;Cleaning;Occupation;Community Activity;Driving;Laundry    Stability/Clinical Decision Making Evolving/Moderate complexity    PT Frequency 2x / week    PT Duration 8 weeks    PT Treatment/Interventions ADLs/Self Care Home Management;Aquatic  Therapy;Cryotherapy;Electrical Stimulation;Ultrasound;Moist Heat;Iontophoresis 50m/ml Dexamethasone;Functional mobility training;Therapeutic activities;Therapeutic exercise;Neuromuscular re-education;Manual techniques;Patient/family education;Taping;Dry needling    PT Next Visit Plan await input from MD office regarding neuro symptoms;  High symptom irritability; seated rows; mat ex's;  aquatic ex;  graded exposure exercise, low repetition step ups; reaching up; staggered stance rocking to simulate vacumning    PT Home Exercise Plan [id number]    Consulted and Agree with Plan of Care Patient           Patient will benefit from skilled therapeutic intervention in order to improve the following deficits and impairments:  Decreased range of motion,Difficulty walking,Increased fascial restricitons,Increased muscle spasms,Decreased activity tolerance,Impaired perceived functional ability,Pain,Decreased strength  Visit Diagnosis: Acute right-sided low back pain with right-sided sciatica  Muscle spasm of back  Muscle weakness (generalized)     Problem List Patient Active Problem List   Diagnosis Date Noted  . 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 03/09/2020  . 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 Intractable low back pain 03/09/2020  . Left hip pain 12/10/2018  . Osteopenia 05/11/2018  . Bursitis of hip 02/18/2017  . 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 GERD (gastroesophageal reflux disease) 08/04/2014  . Upper airway cough syndrome 08/04/2014  . Chest discomfort 05/25/2014    Lisia Westbay, PTA 07/14/2020, 3:47 PM  [facility] [address] [place], [place], [address] Phone: [phone]   Fax:  [phone]  Name: Taylor Sharp MRN: [id number] Date of Birth: 1961/01/30

## 2020-07-17 ENCOUNTER — Ambulatory Visit: Payer: 59 | Admitting: Physical Therapy

## 2020-07-17 ENCOUNTER — Other Ambulatory Visit: Payer: Self-pay

## 2020-07-17 ENCOUNTER — Encounter: Payer: Self-pay | Admitting: Physical Therapy

## 2020-07-17 DIAGNOSIS — R262 Difficulty in walking, not elsewhere classified: Secondary | ICD-10-CM

## 2020-07-17 DIAGNOSIS — M5441 Lumbago with sciatica, right side: Secondary | ICD-10-CM | POA: Diagnosis not present

## 2020-07-17 DIAGNOSIS — M62830 Muscle spasm of back: Secondary | ICD-10-CM

## 2020-07-17 DIAGNOSIS — M6281 Muscle weakness (generalized): Secondary | ICD-10-CM

## 2020-07-17 NOTE — Therapy (Signed)
[facility] [address], [place], [place], [address] Phone: [phone]   Fax:  [phone]  Physical Therapy Treatment  Patient Details  Name: Taylor Sharp MRN: [id number] Date of Birth: Nov 05, 1960 Referring Provider (PT): Dr. Nikki Dom   Encounter Date: 07/17/2020   PT End of Session - 07/17/20 0929    Visit Number 15    Number of Visits 30    Date for PT Re-Evaluation 08/29/20    Authorization Type [employer] 30 visit limit    PT Start Time 0930    PT Stop Time 1009    PT Time Calculation (min) 39 min    Activity Tolerance Patient limited by pain    Behavior During Therapy [facility] for tasks assessed/performed           Past Medical History:  Diagnosis Date  . Asthma     Past Surgical History:  Procedure Laterality Date  . ABLATION    . IR ANGIO/SPINAL LEFT  03/11/2020  . IR ANGIO/SPINAL LEFT  03/11/2020  . IR ANGIO/SPINAL LEFT  03/11/2020  . IR ANGIO/SPINAL LEFT  03/11/2020  . IR ANGIO/SPINAL LEFT  03/11/2020  . IR ANGIO/SPINAL LEFT  03/11/2020  . IR ANGIO/SPINAL LEFT  03/11/2020  . IR ANGIO/SPINAL RIGHT  03/11/2020  . IR ANGIO/SPINAL RIGHT  03/11/2020  . IR ANGIO/SPINAL RIGHT  03/11/2020  . IR ANGIO/SPINAL RIGHT  03/11/2020  . IR ANGIO/SPINAL RIGHT  03/11/2020  . IR ANGIO/SPINAL RIGHT  03/11/2020  . IR ANGIOGRAM SELECTIVE EACH ADDITIONAL VESSEL  03/11/2020  . IR ANGIOGRAM SELECTIVE EACH ADDITIONAL VESSEL  03/11/2020  . IR US GUIDE VASC ACCESS RIGHT  03/11/2020  . RADIOLOGY WITH ANESTHESIA N/A 03/11/2020   Procedure: IR WITH ANESTHESIA;  Surgeon: Radiologist, Medication, MD;  Location: [place];  Service: Radiology;  Laterality: N/A;    There were no vitals filed for this visit.   Subjective Assessment - 07/17/20 0934    Subjective Did ok after the pool, just tired, no increased pain.    Currently in Pain? Yes   LTmid/low back 4/10, RT leg 5/10   Pain Descriptors / Indicators Dull;Sore;Radiating    Multiple Pain Sites No                              [facility] Adult PT Treatment/Exercise - 07/17/20 0001      Lumbar Exercises: Stretches   Double Knee to Chest Stretch 3 reps;10 seconds    Lower Trunk Rotation 5 reps    Lower Trunk Rotation Limitations LE on mat      Lumbar Exercises: Supine   Ab Set 10 reps    Heel Slides Limitations HS sets on green ball 5x   Pt reports < 50% effort, some increased RT groin symptoms.   Bridge 5 reps    Bridge Limitations Legs straight on green ball    Other Supine Lumbar Exercises UE bil shoulder extension pushing isometrically into the mat 5x      Knee/Hip Exercises: Standing   Forward Step Up Left;1 set;10 reps;Hand Hold: 2;Step Height: 6"    Other Standing Knee Exercises Single leg stance RT with LT toe taps 10x: increased RT calf "tingles."      Shoulder Exercises: Supine   Other Supine Exercises Arms at 90 degrees, pressing elbows into the mat 2x5  PT Short Term Goals - 07/04/20 1730      PT SHORT TERM GOAL #1   Title The patient will be able to initiate a basic HEP for core and LE strengthening (especially) right LE to facilitate return to ADLs    Status Achieved      PT SHORT TERM GOAL #2   Title The patient will report a 30% improvement in back and right LE pain particularly at the end of the day and with household chores including doing the dishwasher    Status Achieved      PT SHORT TERM GOAL #3   Title FOTO functional score improved from 33% to 43% indicating improved function with less pain    Status Achieved      PT SHORT TERM GOAL #4   Title The patient will be able to walk 8-10 minutes unassisted    Status Achieved      PT SHORT TERM GOAL #5   Title The patient will have improved spinal flexion to 60 degrees, extension to 20 degrees and bil sidebending to 20 degrees    Status Partially Met             PT Long Term Goals - 07/04/20 1730      PT LONG TERM GOAL #1   Title The patient will be  independent with safe self progression of HEP    Time 8    Period Weeks    Status On-going    Target Date 08/29/20      PT LONG TERM GOAL #2   Title The patient will report a 60% improvement in back and right LE symptoms at the end of the day and with household chores    Time 8    Period Weeks    Status On-going      PT LONG TERM GOAL #3   Title The patient will have improved right LE and core strength grossly 4+/5 needed for reaching, sit to stand and walking longer distances    Time 8    Period Weeks    Status On-going      PT LONG TERM GOAL #4   Title The patient will demonstrate improved activity tolerance for sitting, standing and walking 20-30 minutes    Time 8    Period Weeks    Status Revised      PT LONG TERM GOAL #5   Title FOTO functional outcome score improved to 57% indicating improved function with less pain    Time 8    Period Weeks    Status On-going                 Plan - 07/17/20 0943    Clinical Impression Statement Typically symptoms in RT leg come on about after 5 rep. At one point in the session pt had to stop and lay on her side due to increased symptoms along the RTLE. Pt reports a decreased muscular sensation on the LT mid back/lats vs the RT. Pt does require extra time when changing positions or transitioning as it can increase her RTLE symptoms.    Personal Factors and Comorbidities Comorbidity 1;Profession;Comorbidity 2;Comorbidity 3+;Past/Current Experience    Comorbidities osteopenia; hx of left hip bursitis; anxiety    Examination-Activity Limitations Locomotion Level;Bed Mobility;Reach Overhead;Bend;Caring for Others;Carry;Sleep;Lift;Stand;Hygiene/Grooming;Squat    Examination-Participation Restrictions Meal Prep;Cleaning;Occupation;Community Activity;Driving;Laundry    Stability/Clinical Decision Making Evolving/Moderate complexity    Rehab Potential Good    PT Frequency 2x / week    PT  Duration 8 weeks    PT Treatment/Interventions  ADLs/Self Care Home Management;Aquatic Therapy;Cryotherapy;Electrical Stimulation;Ultrasound;Moist Heat;Iontophoresis 41m/ml Dexamethasone;Functional mobility training;Therapeutic activities;Therapeutic exercise;Neuromuscular re-education;Manual techniques;Patient/family education;Taping;Dry needling    PT Next Visit Plan Aquatics next    PT Home Exercise Plan [id number]    Consulted and Agree with Plan of Care Patient           Patient will benefit from skilled therapeutic intervention in order to improve the following deficits and impairments:  Decreased range of motion,Difficulty walking,Increased fascial restricitons,Increased muscle spasms,Decreased activity tolerance,Impaired perceived functional ability,Pain,Decreased strength  Visit Diagnosis: Acute right-sided low back pain with right-sided sciatica  Muscle spasm of back  Muscle weakness (generalized)  Difficulty in walking, not elsewhere classified     Problem List Patient Active Problem List   Diagnosis Date Noted  . 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 03/09/2020  . 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 Intractable low back pain 03/09/2020  . Left hip pain 12/10/2018  . Osteopenia 05/11/2018  . Bursitis of hip 02/18/2017  . 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 GERD (gastroesophageal reflux disease) 08/04/2014  . Upper airway cough syndrome 08/04/2014  . Chest discomfort 05/25/2014    Taylor Sharp, PTA 07/17/2020, 10:11 AM  [facility] [address] [place] [address]Phone: [phone]  Fax:  [phone] Name: RLulie HurdMRN: [id number] of Birth: 1July 07, 1962

## 2020-07-19 ENCOUNTER — Encounter: Payer: 59 | Admitting: Physical Therapy

## 2020-07-26 ENCOUNTER — Encounter: Payer: 59 | Admitting: Physical Therapy

## 2020-07-28 ENCOUNTER — Ambulatory Visit: Payer: 59 | Admitting: Physical Therapy

## 2020-07-28 ENCOUNTER — Encounter: Payer: Self-pay | Admitting: Physical Therapy

## 2020-07-28 ENCOUNTER — Other Ambulatory Visit: Payer: Self-pay

## 2020-07-28 DIAGNOSIS — M5441 Lumbago with sciatica, right side: Secondary | ICD-10-CM | POA: Diagnosis not present

## 2020-07-28 DIAGNOSIS — M62830 Muscle spasm of back: Secondary | ICD-10-CM

## 2020-07-28 DIAGNOSIS — M6281 Muscle weakness (generalized): Secondary | ICD-10-CM

## 2020-07-28 DIAGNOSIS — R262 Difficulty in walking, not elsewhere classified: Secondary | ICD-10-CM

## 2020-07-28 NOTE — Therapy (Signed)
[facility] [address] [place], [place], [address] Phone: [phone]   Fax:  [phone]  Physical Therapy Treatment  Patient Details  Name: Taylor Sharp MRN: [id number] Date of Birth: December 10, 1960 Referring Provider (PT): Dr. Nikki Dom   Encounter Date: 07/28/2020   PT End of Session - 07/28/20 1544    Visit Number 16    Number of Visits 30    Date for PT Re-Evaluation 08/29/20    Authorization Type [employer] 30 visit limit    PT Start Time 1210    PT Stop Time 1250    PT Time Calculation (min) 40 min    Activity Tolerance Patient limited by pain    Behavior During Therapy [facility] for tasks assessed/performed           Past Medical History:  Diagnosis Date  . Asthma     Past Surgical History:  Procedure Laterality Date  . ABLATION    . IR ANGIO/SPINAL LEFT  03/11/2020  . IR ANGIO/SPINAL LEFT  03/11/2020  . IR ANGIO/SPINAL LEFT  03/11/2020  . IR ANGIO/SPINAL LEFT  03/11/2020  . IR ANGIO/SPINAL LEFT  03/11/2020  . IR ANGIO/SPINAL LEFT  03/11/2020  . IR ANGIO/SPINAL LEFT  03/11/2020  . IR ANGIO/SPINAL RIGHT  03/11/2020  . IR ANGIO/SPINAL RIGHT  03/11/2020  . IR ANGIO/SPINAL RIGHT  03/11/2020  . IR ANGIO/SPINAL RIGHT  03/11/2020  . IR ANGIO/SPINAL RIGHT  03/11/2020  . IR ANGIO/SPINAL RIGHT  03/11/2020  . IR ANGIOGRAM SELECTIVE EACH ADDITIONAL VESSEL  03/11/2020  . IR ANGIOGRAM SELECTIVE EACH ADDITIONAL VESSEL  03/11/2020  . IR US GUIDE VASC ACCESS RIGHT  03/11/2020  . RADIOLOGY WITH ANESTHESIA N/A 03/11/2020   Procedure: IR WITH ANESTHESIA;  Surgeon: Radiologist, Medication, MD;  Location: [place];  Service: Radiology;  Laterality: N/A;    There were no vitals filed for this visit.   Subjective Assessment - 07/28/20 1542    Subjective I had my MRI this week. I see the doctor next week. MRI states I have osteoporosis.  I am walking every day    Pertinent History Gabapentin and Robaxin;  no current restrictions;  typically  hypermobile; left hip bursitis; osteopenia    Currently in Pain? Yes    Pain Score 6     Pain Location Back    Pain Orientation Mid    Pain Descriptors / Indicators Radiating    Aggravating Factors  Overdoing it    Pain Relieving Factors rest    Multiple Pain Sites No   This changes throughout the PT session          Treatment: Aquatics Patient seen for aquatic therapy today.  Treatment took place in water 3.5-4.5 feet deep depending upon activity.  Pt entered the pool via steps with heavy use of hand rails. Water temp 90 degrees F  Seated water bench with 75% submersion Pt performed seated LE AROM exercises 20x in all planes, double knees to chest is most relieving to pt's back pain. She performs this a few times.  Mid chest depth: water walking 6 lengths forward, backward and then 3x sidestepping. This increased patients back pain. PTA attempted various positions to try and alleviate the pain but was unsuccessful. Pt was finally able to return to 75% submersion seated at water bench with pain reduction in about 5 min of total rest. Pt was able to exit the pool today with the fastest speed and least amount of pain ever.  PT Short Term Goals - 07/04/20 1730      PT SHORT TERM GOAL #1   Title The patient will be able to initiate a basic HEP for core and LE strengthening (especially) right LE to facilitate return to ADLs    Status Achieved      PT SHORT TERM GOAL #2   Title The patient will report a 30% improvement in back and right LE pain particularly at the end of the day and with household chores including doing the dishwasher    Status Achieved      PT SHORT TERM GOAL #3   Title FOTO functional score improved from 33% to 43% indicating improved function with less pain    Status Achieved      PT SHORT TERM GOAL #4   Title The patient will be able to walk 8-10 minutes unassisted    Status Achieved      PT SHORT TERM GOAL #5    Title The patient will have improved spinal flexion to 60 degrees, extension to 20 degrees and bil sidebending to 20 degrees    Status Partially Met             PT Long Term Goals - 07/04/20 1730      PT LONG TERM GOAL #1   Title The patient will be independent with safe self progression of HEP    Time 8    Period Weeks    Status On-going    Target Date 08/29/20      PT LONG TERM GOAL #2   Title The patient will report a 60% improvement in back and right LE symptoms at the end of the day and with household chores    Time 8    Period Weeks    Status On-going      PT LONG TERM GOAL #3   Title The patient will have improved right LE and core strength grossly 4+/5 needed for reaching, sit to stand and walking longer distances    Time 8    Period Weeks    Status On-going      PT LONG TERM GOAL #4   Title The patient will demonstrate improved activity tolerance for sitting, standing and walking 20-30 minutes    Time 8    Period Weeks    Status Revised      PT LONG TERM GOAL #5   Title FOTO functional outcome score improved to 57% indicating improved function with less pain    Time 8    Period Weeks    Status On-going                 Plan - 07/28/20 1545    Clinical Impression Statement Pt tolerated more active movement today in the pool before pain in her RT leg and back started and despite trying different positions to help allieviate nothing really helped except rest. Pt did exit the pool including stairs the fastest, least painful than previous.    Personal Factors and Comorbidities Comorbidity 1;Profession;Comorbidity 2;Comorbidity 3+;Past/Current Experience    Comorbidities osteopenia; hx of left hip bursitis; anxiety    Examination-Activity Limitations Locomotion Level;Bed Mobility;Reach Overhead;Bend;Caring for Others;Carry;Sleep;Lift;Stand;Hygiene/Grooming;Squat    Examination-Participation Restrictions Meal Prep;Cleaning;Occupation;Community  Activity;Driving;Laundry    Stability/Clinical Decision Making Evolving/Moderate complexity    Rehab Potential Good    PT Frequency 2x / week    PT Duration 8 weeks    PT Treatment/Interventions ADLs/Self Care Home Management;Aquatic Therapy;Cryotherapy;Electrical Stimulation;Ultrasound;Moist Heat;Iontophoresis 52m/ml Dexamethasone;Functional mobility training;Therapeutic  activities;Therapeutic exercise;Neuromuscular re-education;Manual techniques;Patient/family education;Taping;Dry needling    PT Next Visit Plan Prepare for MD visit, pt wants to know what questions are best to ask.    PT Home Exercise Plan [id number]    Consulted and Agree with Plan of Care Patient           Patient will benefit from skilled therapeutic intervention in order to improve the following deficits and impairments:  Decreased range of motion,Difficulty walking,Increased fascial restricitons,Increased muscle spasms,Decreased activity tolerance,Impaired perceived functional ability,Pain,Decreased strength  Visit Diagnosis: Acute right-sided low back pain with right-sided sciatica  Muscle spasm of back  Muscle weakness (generalized)  Difficulty in walking, not elsewhere classified     Problem List Patient Active Problem List   Diagnosis Date Noted  . 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 03/09/2020  . 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 Intractable low back pain 03/09/2020  . Left hip pain 12/10/2018  . Osteopenia 05/11/2018  . Bursitis of hip 02/18/2017  . 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 GERD (gastroesophageal reflux disease) 08/04/2014  . Upper airway cough syndrome 08/04/2014  . Chest discomfort 05/25/2014    Toa Mia, PTA 07/28/2020, 3:59 PM  [facility] [address], [place], [place], [address] Phone: [phone]   Fax:  [phone]  Name: Taylor Sharp MRN:  [id number] Date of Birth: [birth year]

## 2020-08-01 ENCOUNTER — Other Ambulatory Visit: Payer: Self-pay

## 2020-08-01 ENCOUNTER — Ambulatory Visit: Payer: 59 | Admitting: Physical Therapy

## 2020-08-01 DIAGNOSIS — M62830 Muscle spasm of back: Secondary | ICD-10-CM

## 2020-08-01 DIAGNOSIS — M6281 Muscle weakness (generalized): Secondary | ICD-10-CM

## 2020-08-01 DIAGNOSIS — R262 Difficulty in walking, not elsewhere classified: Secondary | ICD-10-CM

## 2020-08-01 DIAGNOSIS — M5441 Lumbago with sciatica, right side: Secondary | ICD-10-CM | POA: Diagnosis not present

## 2020-08-01 NOTE — Therapy (Signed)
[facility] [address] [place], [place], [address] Phone: [phone]   Fax:  [phone]  Physical Therapy Treatment  Patient Details  Name: Taylor Sharp MRN: [id number] Date of Birth: 1960/09/21 Referring Provider (PT): Dr. Nikki Dom   Encounter Date: 08/01/2020   PT End of Session - 08/01/20 1141    Visit Number 17    Number of Visits 30    Date for PT Re-Evaluation 08/29/20    Authorization Type [employer] 30 visit limit    PT Start Time 0845    PT Stop Time 0930    PT Time Calculation (min) 45 min    Activity Tolerance Patient limited by pain           Past Medical History:  Diagnosis Date  . Asthma     Past Surgical History:  Procedure Laterality Date  . ABLATION    . IR ANGIO/SPINAL LEFT  03/11/2020  . IR ANGIO/SPINAL LEFT  03/11/2020  . IR ANGIO/SPINAL LEFT  03/11/2020  . IR ANGIO/SPINAL LEFT  03/11/2020  . IR ANGIO/SPINAL LEFT  03/11/2020  . IR ANGIO/SPINAL LEFT  03/11/2020  . IR ANGIO/SPINAL LEFT  03/11/2020  . IR ANGIO/SPINAL RIGHT  03/11/2020  . IR ANGIO/SPINAL RIGHT  03/11/2020  . IR ANGIO/SPINAL RIGHT  03/11/2020  . IR ANGIO/SPINAL RIGHT  03/11/2020  . IR ANGIO/SPINAL RIGHT  03/11/2020  . IR ANGIO/SPINAL RIGHT  03/11/2020  . IR ANGIOGRAM SELECTIVE EACH ADDITIONAL VESSEL  03/11/2020  . IR ANGIOGRAM SELECTIVE EACH ADDITIONAL VESSEL  03/11/2020  . IR US GUIDE VASC ACCESS RIGHT  03/11/2020  . RADIOLOGY WITH ANESTHESIA N/A 03/11/2020   Procedure: IR WITH ANESTHESIA;  Surgeon: Radiologist, Medication, MD;  Location: [place];  Service: Radiology;  Laterality: N/A;    There were no vitals filed for this visit.   Subjective Assessment - 08/01/20 0847    Subjective Had MRI but not seeing the doctor until tomorrow.  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  Doctor's appts tomorrow and Thursday.    Pertinent History Gabapentin and Robaxin;  no current restrictions;  typically hypermobile; left hip bursitis; osteopenia    How long  can you walk comfortably? I can make the [employer] loop now but I have to stop a few times.  The hills are hard.    Currently in Pain? Yes    Pain Score 6     Pain Location Back                             [facility] Adult PT Treatment/Exercise - 08/01/20 0001      Lumbar Exercises: Standing   Other Standing Lumbar Exercises mini push ups on tall table 5x    Other Standing Lumbar Exercises plank on tall table 3x 10 sec   attempts of knee bending with forearm lean on tall table     Lumbar Exercises: Supine   AB Set Limitations hooklying UE beats 5x    Bent Knee Raise Limitations holding band at 90 degrees with LE marching    Other Supine Lumbar Exercises bil UE elevation and pull down circles 8x    Other Supine Lumbar Exercises red band overhead lat pull downs 10x      Moist Heat Therapy   Number Minutes Moist Heat 2 Minutes    Moist Heat Location Lumbar Spine   for a short time with supine ex  PT Short Term Goals - 07/04/20 1730      PT SHORT TERM GOAL #1   Title The patient will be able to initiate a basic HEP for core and LE strengthening (especially) right LE to facilitate return to ADLs    Status Achieved      PT SHORT TERM GOAL #2   Title The patient will report a 30% improvement in back and right LE pain particularly at the end of the day and with household chores including doing the dishwasher    Status Achieved      PT SHORT TERM GOAL #3   Title FOTO functional score improved from 33% to 43% indicating improved function with less pain    Status Achieved      PT SHORT TERM GOAL #4   Title The patient will be able to walk 8-10 minutes unassisted    Status Achieved      PT SHORT TERM GOAL #5   Title The patient will have improved spinal flexion to 60 degrees, extension to 20 degrees and bil sidebending to 20 degrees    Status Partially Met             PT Long Term Goals - 07/04/20 1730      PT LONG TERM GOAL #1    Title The patient will be independent with safe self progression of HEP    Time 8    Period Weeks    Status On-going    Target Date 08/29/20      PT LONG TERM GOAL #2   Title The patient will report a 60% improvement in back and right LE symptoms at the end of the day and with household chores    Time 8    Period Weeks    Status On-going      PT LONG TERM GOAL #3   Title The patient will have improved right LE and core strength grossly 4+/5 needed for reaching, sit to stand and walking longer distances    Time 8    Period Weeks    Status On-going      PT LONG TERM GOAL #4   Title The patient will demonstrate improved activity tolerance for sitting, standing and walking 20-30 minutes    Time 8    Period Weeks    Status Revised      PT LONG TERM GOAL #5   Title FOTO functional outcome score improved to 57% indicating improved function with less pain    Time 8    Period Weeks    Status On-going                 Plan - 08/01/20 1141    Clinical Impression Statement Taylor Sharp continues to report right LE symptoms with paresthesia in medial and posterior aspects of thigh and leg which are present quite a bit.  She is concerned about the MRI results she isable to view in her chart and she has her list of questions for the doctor tomorrow.   Progression of care is still quite  limited secondary severity of symptoms.  Majority of exercises performed in unloaded/supine position and short duration standing.  Therapist monitoring response throughout session and modifying reps and intensity accordingly.    Personal Factors and Comorbidities Comorbidity 1;Profession;Comorbidity 2;Comorbidity 3+;Past/Current Experience    Examination-Activity Limitations Locomotion Level;Bed Mobility;Reach Overhead;Bend;Caring for Others;Carry;Sleep;Lift;Stand;Hygiene/Grooming;Squat    Rehab Potential Good    PT Frequency 2x / week    PT Duration 8 weeks  PT Treatment/Interventions ADLs/Self Care Home  Management;Aquatic Therapy;Cryotherapy;Electrical Stimulation;Ultrasound;Moist Heat;Iontophoresis 7m/ml Dexamethasone;Functional mobility training;Therapeutic activities;Therapeutic exercise;Neuromuscular re-education;Manual techniques;Patient/family education;Taping;Dry needling    PT Next Visit Plan see how MD visit went;  low level motor control ex; aquatic ex    PT [place]          Patient will benefit from skilled therapeutic intervention in order to improve the following deficits and impairments:  Decreased range of motion,Difficulty walking,Increased fascial restricitons,Increased muscle spasms,Decreased activity tolerance,Impaired perceived functional ability,Pain,Decreased strength  Visit Diagnosis: Acute right-sided low back pain with right-sided sciatica  Muscle spasm of back  Muscle weakness (generalized)  Difficulty in walking, not elsewhere classified     Problem List Patient Active Problem List   Diagnosis Date Noted  . 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 03/09/2020  . A-V fistula ([place] 03/09/2020  . Intractable low back pain 03/09/2020  . Left hip pain 12/10/2018  . Osteopenia 05/11/2018  . Bursitis of hip 02/18/2017  . 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 GERD (gastroesophageal reflux disease) 08/04/2014  . Upper airway cough syndrome 08/04/2014  . Chest discomfort 05/25/2014   SRuben Im PT 08/01/20 12:05 PM Phone: [phone]: [phone] Singh5/24/2022, 12:05 PM  [facility] [address] [place] [address]Phone: [phone]  Fax:  [phone] Name: RPier LauxMRN: [id number] of Birth: 101-Dec-1962

## 2020-08-04 ENCOUNTER — Ambulatory Visit: Payer: 59 | Admitting: Physical Therapy

## 2020-08-08 ENCOUNTER — Ambulatory Visit: Payer: 59 | Admitting: Physical Therapy

## 2020-08-08 ENCOUNTER — Other Ambulatory Visit: Payer: Self-pay

## 2020-08-08 DIAGNOSIS — M5441 Lumbago with sciatica, right side: Secondary | ICD-10-CM

## 2020-08-08 DIAGNOSIS — R262 Difficulty in walking, not elsewhere classified: Secondary | ICD-10-CM

## 2020-08-08 DIAGNOSIS — M6281 Muscle weakness (generalized): Secondary | ICD-10-CM

## 2020-08-08 DIAGNOSIS — M62830 Muscle spasm of back: Secondary | ICD-10-CM
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
## 2020-08-10 ENCOUNTER — Encounter: Payer: 59 | Admitting: Physical Therapy

## 2020-08-14 ENCOUNTER — Encounter: Payer: 59 | Admitting: Physical Therapy

## 2020-08-15 ENCOUNTER — Ambulatory Visit (HOSPITAL_BASED_OUTPATIENT_CLINIC_OR_DEPARTMENT_OTHER): Payer: 59 | Admitting: Physical Therapy

## 2020-08-18 ENCOUNTER — Encounter: Payer: 59 | Admitting: Physical Therapy

## 2020-08-21 ENCOUNTER — Encounter: Payer: 59 | Admitting: Physical Therapy

## 2020-08-21 ENCOUNTER — Encounter (HOSPITAL_BASED_OUTPATIENT_CLINIC_OR_DEPARTMENT_OTHER): Payer: Self-pay | Admitting: Physical Therapy

## 2020-08-21 ENCOUNTER — Ambulatory Visit (HOSPITAL_BASED_OUTPATIENT_CLINIC_OR_DEPARTMENT_OTHER): Payer: 59 | Attending: Neurosurgery | Admitting: Physical Therapy

## 2020-08-21 ENCOUNTER — Other Ambulatory Visit: Payer: Self-pay

## 2020-08-21 DIAGNOSIS — R262 Difficulty in walking, not elsewhere classified: Secondary | ICD-10-CM | POA: Insufficient documentation

## 2020-08-21 DIAGNOSIS — M62830 Muscle spasm of back: Secondary | ICD-10-CM | POA: Insufficient documentation

## 2020-08-21 DIAGNOSIS — M6281 Muscle weakness (generalized): Secondary | ICD-10-CM | POA: Diagnosis present

## 2020-08-21 DIAGNOSIS — M5441 Lumbago with sciatica, right side: Secondary | ICD-10-CM | POA: Diagnosis not present

## 2020-08-21 NOTE — Therapy (Addendum)
[facility] [address] [place], [place], [address] Phone: [phone]   Fax:  [phone]  Physical Therapy Treatment  Patient Details  Name: Taylor Sharp MRN: [id number] Date of Birth: [birth year] Referring Provider (PT): Dr. Nikki Dom   Encounter Date: 08/21/2020  Subjective 'I though my appointment was at 1145.  I'd like to get in even though we only have 15 minutes."   Past Medical History:  Diagnosis Date   Asthma     Past Surgical History:  Procedure Laterality Date   ABLATION     IR ANGIO/SPINAL LEFT  03/11/2020   IR ANGIO/SPINAL LEFT  03/11/2020   IR ANGIO/SPINAL LEFT  03/11/2020   IR ANGIO/SPINAL LEFT  03/11/2020   IR ANGIO/SPINAL LEFT  03/11/2020   IR ANGIO/SPINAL LEFT  03/11/2020   IR ANGIO/SPINAL LEFT  03/11/2020   IR ANGIO/SPINAL RIGHT  03/11/2020   IR ANGIO/SPINAL RIGHT  03/11/2020   IR ANGIO/SPINAL RIGHT  03/11/2020   IR ANGIO/SPINAL RIGHT  03/11/2020   IR ANGIO/SPINAL RIGHT  03/11/2020   IR ANGIO/SPINAL RIGHT  03/11/2020   IR ANGIOGRAM SELECTIVE EACH ADDITIONAL VESSEL  03/11/2020   IR ANGIOGRAM SELECTIVE EACH ADDITIONAL VESSEL  03/11/2020   IR US GUIDE VASC ACCESS RIGHT  03/11/2020   RADIOLOGY WITH ANESTHESIA N/A 03/11/2020   Procedure: IR WITH ANESTHESIA;  Surgeon: Radiologist, Medication, MD;  Location: [facility];  Service: Radiology;  Laterality: N/A;    There were no vitals filed for this visit.    Pt seen for aquatic therapy today.  Treatment took place in water 3.25-4.8 ft in depth at the [facility] pool. Temp of water was 91.  Pt entered/exited the pool via stairs step to pattern and cga with bilat rail.  Pt instructed on gentle core stretching and strengthening suspended by noodle prone and supine with knee flex, extension in both positions. Cuing for sustained gentle stretch. Core rotation suspended in vertical (gentle).  Kick board pushdowns x 10 reps (2 sets) with explanation on balance and core  strength    Pt requires buoyancy for support and to offload joints with strengthening exercises. Viscosity of the water is needed for resistance of strengthening; water current perturbations provides challenge to standing balance unsupported, requiring increased core activation.                               PT Short Term Goals - 08/29/20 1639       PT SHORT TERM GOAL #1   Title The patient will be able to initiate a basic HEP for core and LE strengthening (especially) right LE to facilitate return to ADLs    Status Achieved      PT SHORT TERM GOAL #2   Title The patient will report a 30% improvement in back and right LE pain particularly at the end of the day and with household chores including doing the dishwasher    Status Achieved      PT SHORT TERM GOAL #3   Title FOTO functional score improved from 33% to 43% indicating improved function with less pain    Status Achieved      PT SHORT TERM GOAL #4   Title The patient will be able to walk 8-10 minutes unassisted    Status Achieved      PT SHORT TERM GOAL #5   Title The patient will have improved spinal flexion to 60 degrees, extension to 20 degrees  and bil sidebending to 20 degrees    Status Partially Met               PT Long Term Goals - 08/29/20 1639       PT LONG TERM GOAL #1   Title The patient will be independent with safe self progression of HEP( land and water based ex)    Time 16    Period Weeks    Status On-going    Target Date 12/19/20      PT LONG TERM GOAL #2   Title The patient will report a 50% improvement in back and right LE symptoms at the end of the day and with household chores    Time 16    Period Weeks    Status Revised      PT LONG TERM GOAL #3   Title The patient will have improved right LE motor control and core strength grossly 4 to 4+/5 needed for sit to stand, reaching, carrying light bags    Time 16    Period Weeks    Status Revised      PT LONG  TERM GOAL #4   Title The patient will demonstrate improved activity tolerance for  walking 20 minutes or 1.5 miles at a faster pace    Time 16    Period Weeks    Status Revised      PT LONG TERM GOAL #5   Title FOTO functional outcome score improved to 57% indicating improved function    Time 16    Period Weeks    Status Revised                     Patient will benefit from skilled therapeutic intervention in order to improve the following deficits and impairments:  Decreased range of motion, Difficulty walking, Increased fascial restricitons, Increased muscle spasms, Decreased activity tolerance, Impaired perceived functional ability, Pain, Decreased strength  Visit Diagnosis: Acute right-sided low back pain with right-sided sciatica  Muscle spasm of back  Muscle weakness (generalized)  Difficulty in walking, not elsewhere classified  Clinical Assessment Pt with very late arrival.  Pt wanted to use the 15-20 mins left of treatment time.  Concentration on HEP instruction since access to pool.  Although pt moving slowly she is confident in the aquatic setting, obviously a seasoned exerciser/swimmer and knowldgable of condition.  She needed cga and extra time to exit pool via steps and bilateral handrail/ cga for safety.  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 [place], [place], [address] Phone: [phone]   Fax:   [phone]  Name: Taylor Sharp MRN: [id number] Date of Birth: 06-30-1960  Addended Stanton Kidney Tharon Aquas) Alisse Tuite MPT 09/21/20

## 2020-08-24 ENCOUNTER — Encounter: Payer: 59 | Admitting: Physical Therapy

## 2020-08-28 ENCOUNTER — Ambulatory Visit (HOSPITAL_BASED_OUTPATIENT_CLINIC_OR_DEPARTMENT_OTHER): Payer: 59 | Admitting: Physical Therapy

## 2020-08-28 ENCOUNTER — Other Ambulatory Visit: Payer: Self-pay

## 2020-08-28 ENCOUNTER — Encounter (HOSPITAL_BASED_OUTPATIENT_CLINIC_OR_DEPARTMENT_OTHER): Payer: Self-pay | Admitting: Physical Therapy

## 2020-08-28 DIAGNOSIS — M5441 Lumbago with sciatica, right side: Secondary | ICD-10-CM

## 2020-08-28 DIAGNOSIS — M62830 Muscle spasm of back: Secondary | ICD-10-CM

## 2020-08-28 DIAGNOSIS — M6281 Muscle weakness (generalized): Secondary | ICD-10-CM

## 2020-08-28 DIAGNOSIS — R262 Difficulty in walking, not elsewhere classified: Secondary | ICD-10-CM

## 2020-08-28 NOTE — Therapy (Addendum)
[facility] [address] [place], [place], [address] Phone: [phone]   Fax:  [phone]  Physical Therapy Treatment  Patient Details  Name: Taylor Sharp MRN: [id number] Date of Birth: 1960-10-28 Referring Provider (PT): Dr. Nikki Dom   Encounter Date: 08/28/2020  Subjective Early today. I have been exercising in my friends pool. Just trying to get my muscles stronger. I walked a mile and a half with my husand the other night.  Pain: acute radiating right leg 4/10  Past Medical History:  Diagnosis Date   Asthma     Past Surgical History:  Procedure Laterality Date   ABLATION     IR ANGIO/SPINAL LEFT  03/11/2020   IR ANGIO/SPINAL LEFT  03/11/2020   IR ANGIO/SPINAL LEFT  03/11/2020   IR ANGIO/SPINAL LEFT  03/11/2020   IR ANGIO/SPINAL LEFT  03/11/2020   IR ANGIO/SPINAL LEFT  03/11/2020   IR ANGIO/SPINAL LEFT  03/11/2020   IR ANGIO/SPINAL RIGHT  03/11/2020   IR ANGIO/SPINAL RIGHT  03/11/2020   IR ANGIO/SPINAL RIGHT  03/11/2020   IR ANGIO/SPINAL RIGHT  03/11/2020   IR ANGIO/SPINAL RIGHT  03/11/2020   IR ANGIO/SPINAL RIGHT  03/11/2020   IR ANGIOGRAM SELECTIVE EACH ADDITIONAL VESSEL  03/11/2020   IR ANGIOGRAM SELECTIVE EACH ADDITIONAL VESSEL  03/11/2020   IR US GUIDE VASC ACCESS RIGHT  03/11/2020   RADIOLOGY WITH ANESTHESIA N/A 03/11/2020   Procedure: IR WITH ANESTHESIA;  Surgeon: Radiologist, Medication, MD;  Location: [facility];  Service: Radiology;  Laterality: N/A;    There were no vitals filed for this visit. Knees to chest, rotation sup and prone  Pt seen for aquatic therapy today.  Treatment took place in water 3.25-4.8 ft in depth at the [facility] pool. Temp of water was 91.  Pt entered/exited the pool via stairs step through pattern cga with bilat rail.  Warm up: water walking forward, backward and sideways with increased step length, speed and positioning of ue.  Seated: stretching of gastroc , hamstring and LB. Knee flex/ext and  add /abd with foam cuffs x 10 reps.  Supported on hands on bench pt facilitated on knees to chest for LB stretching then core strengthening x 10 reps. Knees to opposite shoulder x 5 reps.  Superman position on bench Knees to chest then kick out x 10 reps, jack knife x 5 reps.  Both facilitated and supported by PT and foam.  Suspended in vertical with kickboard then squoodle sitting/strattled core strength maintaining balance and aerobic capacity training bicycling and flutter kicking forward and back 4 lengths of pool.  Pt requires multiple recovery period    Pt requires buoyancy for support and to offload joints with strengthening exercises. Viscosity of the water is needed for resistance of strengthening; water current perturbations provides challenge to standing balance unsupported, requiring increased core activation.                                PT Short Term Goals - 08/29/20 1639       PT SHORT TERM GOAL #1   Title The patient will be able to initiate a basic HEP for core and LE strengthening (especially) right LE to facilitate return to ADLs    Status Achieved      PT SHORT TERM GOAL #2   Title The patient will report a 30% improvement in back and right LE pain particularly at the end of the day  and with household chores including doing the dishwasher    Status Achieved      PT SHORT TERM GOAL #3   Title FOTO functional score improved from 33% to 43% indicating improved function with less pain    Status Achieved      PT SHORT TERM GOAL #4   Title The patient will be able to walk 8-10 minutes unassisted    Status Achieved      PT SHORT TERM GOAL #5   Title The patient will have improved spinal flexion to 60 degrees, extension to 20 degrees and bil sidebending to 20 degrees    Status Partially Met               PT Long Term Goals - 08/29/20 1639       PT LONG TERM GOAL #1   Title The patient will be independent with safe self  progression of HEP( land and water based ex)    Time 16    Period Weeks    Status On-going    Target Date 12/19/20      PT LONG TERM GOAL #2   Title The patient will report a 50% improvement in back and right LE symptoms at the end of the day and with household chores    Time 16    Period Weeks    Status Revised      PT LONG TERM GOAL #3   Title The patient will have improved right LE motor control and core strength grossly 4 to 4+/5 needed for sit to stand, reaching, carrying light bags    Time 16    Period Weeks    Status Revised      PT LONG TERM GOAL #4   Title The patient will demonstrate improved activity tolerance for  walking 20 minutes or 1.5 miles at a faster pace    Time 16    Period Weeks    Status Revised      PT LONG TERM GOAL #5   Title FOTO functional outcome score improved to 57% indicating improved function    Time 16    Period Weeks    Status Revised                    Patient will benefit from skilled therapeutic intervention in order to improve the following deficits and impairments:  Decreased range of motion, Difficulty walking, Increased fascial restricitons, Increased muscle spasms, Decreased activity tolerance, Impaired perceived functional ability, Pain, Decreased strength  Visit Diagnosis: Acute right-sided low back pain with right-sided sciatica  Muscle spasm of back  Muscle weakness (generalized)  Difficulty in walking, not elsewhere classified  Clinical Assessment    Clinical Impression Statement Pt participates in therapy with good enthusiasm. She requires many recovery periods throughuot due to discomfort and fatigue. No complaints of increased discomfort. Pt instructed on use of noodles and exercises to complete when at neighbors pool.        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

## 2020-08-29 ENCOUNTER — Ambulatory Visit: Payer: 59 | Attending: Neurosurgery | Admitting: Physical Therapy

## 2020-08-29 DIAGNOSIS — R262 Difficulty in walking, not elsewhere classified: Secondary | ICD-10-CM | POA: Insufficient documentation

## 2020-08-29 DIAGNOSIS — M6281 Muscle weakness (generalized): Secondary | ICD-10-CM | POA: Diagnosis present

## 2020-08-29 DIAGNOSIS — M5441 Lumbago with sciatica, right side: Secondary | ICD-10-CM | POA: Diagnosis not present

## 2020-08-29 DIAGNOSIS — M62830 Muscle spasm of back: Secondary | ICD-10-CM | POA: Insufficient documentation

## 2020-08-29 NOTE — Therapy (Signed)
[facility] [address], [place], [place], [address] Phone: [phone]   Fax:  [phone]  Physical Therapy Treatment/Recertification   Patient Details  Name: Taylor Sharp MRN: [id number] Date of Birth: 17-Jan-1961 Referring Provider (PT): Dr. Nikki Dom   Encounter Date: 08/29/2020   PT End of Session - 08/29/20 0953     Visit Number 21    Number of Visits 30    Date for PT Re-Evaluation 12/19/20    Authorization Type [employer] 30 visit limit    PT Start Time 0938    PT Stop Time 1018    PT Time Calculation (min) 40 min    Activity Tolerance Patient limited by pain             Past Medical History:  Diagnosis Date   Asthma     Past Surgical History:  Procedure Laterality Date   ABLATION     IR ANGIO/SPINAL LEFT  03/11/2020   IR ANGIO/SPINAL LEFT  03/11/2020   IR ANGIO/SPINAL LEFT  03/11/2020   IR ANGIO/SPINAL LEFT  03/11/2020   IR ANGIO/SPINAL LEFT  03/11/2020   IR ANGIO/SPINAL LEFT  03/11/2020   IR ANGIO/SPINAL LEFT  03/11/2020   IR ANGIO/SPINAL RIGHT  03/11/2020   IR ANGIO/SPINAL RIGHT  03/11/2020   IR ANGIO/SPINAL RIGHT  03/11/2020   IR ANGIO/SPINAL RIGHT  03/11/2020   IR ANGIO/SPINAL RIGHT  03/11/2020   IR ANGIO/SPINAL RIGHT  03/11/2020   IR ANGIOGRAM SELECTIVE EACH ADDITIONAL VESSEL  03/11/2020   IR ANGIOGRAM SELECTIVE EACH ADDITIONAL VESSEL  03/11/2020   IR US GUIDE VASC ACCESS RIGHT  03/11/2020   RADIOLOGY WITH ANESTHESIA N/A 03/11/2020   Procedure: IR WITH ANESTHESIA;  Surgeon: Radiologist, Medication, MD;  Location: [place];  Service: Radiology;  Laterality: N/A;    There were no vitals filed for this visit.   Subjective Assessment - 08/29/20 0943     Subjective I missed some appts b/c I had covid.  I had a fever and GI symptoms.  I'm sore today b/c I did a lot in the pool yesterday.  I've done some different things there.  I've got some weakness in my back.  Especially on my left side.  I lose my balance to the  back and left side.  More flare ups in my left leg but the right leg is still the worst.  My calf is atrophying.    Pertinent History Gabapentin and Robaxin;  no current restrictions;  typically hypermobile; left hip bursitis; osteopenia    Currently in Pain? Yes    Pain Score 5     Pain Location Leg    Pain Type Acute pain                [facility] PT Assessment - 08/29/20 0001       Observation/Other Assessments   Focus on Therapeutic Outcomes (FOTO)  44%                           [facility] Adult PT Treatment/Exercise - 08/29/20 0001       Self-Care   Self-Care Other Self-Care Comments    Other Self-Care Comments  discussed underdosing of ex/activity to avoid exacerbations      Lumbar Exercises: Standing   Other Standing Lumbar Exercises isometric perturbances with golf club held vertically in multi direction encouraged wide staggered stance for stability      Lumbar Exercises: Supine   Bent  Knee Raise Limitations holding red band UEs at 90 degrees with heelslides 5x both; single arms 3x right/left    Other Supine Lumbar Exercises red band overhead lat pull downs 10x      Manual Therapy   Manual therapy comments right hip motor control isometric hip abduction 5 sec hold 3x; hip adduction isometric 5 sec hold 3x                      PT Short Term Goals - 08/29/20 1639       PT SHORT TERM GOAL #1   Title The patient will be able to initiate a basic HEP for core and LE strengthening (especially) right LE to facilitate return to ADLs    Status Achieved      PT SHORT TERM GOAL #2   Title The patient will report a 30% improvement in back and right LE pain particularly at the end of the day and with household chores including doing the dishwasher    Status Achieved      PT SHORT TERM GOAL #3   Title FOTO functional score improved from 33% to 43% indicating improved function with less pain    Status Achieved      PT SHORT TERM GOAL #4   Title The  patient will be able to walk 8-10 minutes unassisted    Status Achieved      PT SHORT TERM GOAL #5   Title The patient will have improved spinal flexion to 60 degrees, extension to 20 degrees and bil sidebending to 20 degrees    Status Partially Met               PT Long Term Goals - 08/29/20 1639       PT LONG TERM GOAL #1   Title The patient will be independent with safe self progression of HEP( land and water based ex)    Time 16    Period Weeks    Status On-going    Target Date 12/19/20      PT LONG TERM GOAL #2   Title The patient will report a 50% improvement in back and right LE symptoms at the end of the day and with household chores    Time 16    Period Weeks    Status Revised      PT LONG TERM GOAL #3   Title The patient will have improved right LE motor control and core strength grossly 4 to 4+/5 needed for sit to stand, reaching, carrying light bags    Time 16    Period Weeks    Status Revised      PT LONG TERM GOAL #4   Title The patient will demonstrate improved activity tolerance for  walking 20 minutes or 1.5 miles at a faster pace    Time 16    Period Weeks    Status Revised      PT LONG TERM GOAL #5   Title FOTO functional outcome score improved to 57% indicating improved function    Time 16    Period Weeks    Status Revised                   Plan - 08/29/20 1521     Clinical Impression Statement The patient reports 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  She states she overdoes it sometimes walking the hilly loop to [employer] or in the neighbor's pool  and pays for it long afterwards.  Discussed graded exposure/dosing of exercise and activity to limit those exacerbations. Discussed limiting ex's to < 10 reps and limiting walking to fewer hills or less time/distance.  She does demonstrate improved tolerance to keeping her right LE extended in supine and overall improved speed of transitional movements and ambulation.   We discussed that given the nature of her new diagnosis she may benefit from spacing her remaining visits out over a longer period of time.   Recommend continued treatment with alternating land and aquatic sessions biweekly to promote independence and a slower, more tolerable progression.    Personal Factors and Comorbidities Comorbidity 1;Profession;Comorbidity 2;Comorbidity 3+;Past/Current Experience    Comorbidities osteopenia; hx of left hip bursitis; anxiety    Examination-Activity Limitations Locomotion Level;Bed Mobility;Reach Overhead;Bend;Caring for Others;Carry;Sleep;Lift;Stand;Hygiene/Grooming;Squat    Examination-Participation Restrictions Meal Prep;Cleaning;Occupation;Community Activity;Driving;Laundry    Rehab Potential Good    PT Frequency Biweekly    PT Duration Other (comment)   16 weeks   PT Treatment/Interventions ADLs/Self Care Home Management;Aquatic Therapy;Cryotherapy;Electrical Stimulation;Ultrasound;Moist Heat;Iontophoresis 30m/ml Dexamethasone;Functional mobility training;Therapeutic activities;Therapeutic exercise;Neuromuscular re-education;Manual techniques;Patient/family education;Taping;Dry needling    PT Home Exercise Plan graded exposure to movement;  biweekly alternating land and aquatic based core and LE strengthening;  balance challenges especially for backwards and to the left side instability; aerobic capacity;  check FOTO next land based appt             Patient will benefit from skilled therapeutic intervention in order to improve the following deficits and impairments:  Decreased range of motion, Difficulty walking, Increased fascial restricitons, Increased muscle spasms, Decreased activity tolerance, Impaired perceived functional ability, Pain, Decreased strength  Visit Diagnosis: Acute right-sided low back pain with right-sided sciatica - Plan: PT plan of care cert/re-cert  Muscle spasm of back - Plan: PT plan of care cert/re-cert  Muscle weakness  (generalized) - Plan: PT plan of care cert/re-cert  Difficulty in walking, not elsewhere classified - Plan: PT plan of care cert/re-cert     Problem List Patient Active Problem List   Diagnosis Date Noted   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 03/09/2020   [place] 03/09/2020   Intractable low back pain 03/09/2020   Left hip pain 12/10/2018   Osteopenia 05/11/2018   Bursitis of hip 02/18/2017   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 [place] [address]: [phone]  Fax:  [phone] Name: RUriel HorkeyMRN: [id number] of Birth: [birth year]

## 2020-09-15 ENCOUNTER — Ambulatory Visit (HOSPITAL_BASED_OUTPATIENT_CLINIC_OR_DEPARTMENT_OTHER): Payer: 59 | Attending: Neurosurgery | Admitting: Physical Therapy

## 2020-09-15 ENCOUNTER — Other Ambulatory Visit: Payer: Self-pay

## 2020-09-15 ENCOUNTER — Encounter (HOSPITAL_BASED_OUTPATIENT_CLINIC_OR_DEPARTMENT_OTHER): Payer: Self-pay | Admitting: Physical Therapy

## 2020-09-15 ENCOUNTER — Ambulatory Visit: Payer: 59 | Admitting: Physical Therapy

## 2020-09-15 DIAGNOSIS — R262 Difficulty in walking, not elsewhere classified: Secondary | ICD-10-CM | POA: Diagnosis present

## 2020-09-15 DIAGNOSIS — M62830 Muscle spasm of back: Secondary | ICD-10-CM | POA: Diagnosis present

## 2020-09-15 DIAGNOSIS — M6281 Muscle weakness (generalized): Secondary | ICD-10-CM | POA: Insufficient documentation

## 2020-09-15 DIAGNOSIS — M5441 Lumbago with sciatica, right side: Secondary | ICD-10-CM | POA: Diagnosis not present

## 2020-09-15 NOTE — Therapy (Signed)
[facility] [address] [place], [place], [address] Phone: [phone]   Fax:  [phone]  Physical Therapy Treatment  Patient Details  Name: Taylor Sharp MRN: [id number] Date of Birth: 28-Dec-1960 Referring Provider (PT): Dr. Nikki Dom   Encounter Date: 09/15/2020   PT End of Session - 09/15/20 1313     Visit Number 22    Number of Visits 30    Date for PT Re-Evaluation 12/19/20    Authorization Type [employer] 30 visit limit    PT Start Time 0818    PT Stop Time 0900    PT Time Calculation (min) 42 min    Equipment Utilized During Treatment Other (comment)    Activity Tolerance Patient limited by pain    Behavior During Therapy [facility] for tasks assessed/performed             Past Medical History:  Diagnosis Date   Asthma     Past Surgical History:  Procedure Laterality Date   ABLATION     IR ANGIO/SPINAL LEFT  03/11/2020   IR ANGIO/SPINAL LEFT  03/11/2020   IR ANGIO/SPINAL LEFT  03/11/2020   IR ANGIO/SPINAL LEFT  03/11/2020   IR ANGIO/SPINAL LEFT  03/11/2020   IR ANGIO/SPINAL LEFT  03/11/2020   IR ANGIO/SPINAL LEFT  03/11/2020   IR ANGIO/SPINAL RIGHT  03/11/2020   IR ANGIO/SPINAL RIGHT  03/11/2020   IR ANGIO/SPINAL RIGHT  03/11/2020   IR ANGIO/SPINAL RIGHT  03/11/2020   IR ANGIO/SPINAL RIGHT  03/11/2020   IR ANGIO/SPINAL RIGHT  03/11/2020   IR ANGIOGRAM SELECTIVE EACH ADDITIONAL VESSEL  03/11/2020   IR ANGIOGRAM SELECTIVE EACH ADDITIONAL VESSEL  03/11/2020   IR US GUIDE VASC ACCESS RIGHT  03/11/2020   RADIOLOGY WITH ANESTHESIA N/A 03/11/2020   Procedure: IR WITH ANESTHESIA;  Surgeon: Radiologist, Medication, MD;  Location: [place];  Service: Radiology;  Laterality: N/A;    There were no vitals filed for this visit.   Subjective Assessment - 09/15/20 1311     Subjective I have been able to get into pool almost everyday for the past week or so.  I think it has really healped.  Also had a massage and saw a chiropractor.    Currently  in Pain? Yes    Pain Score 5     Pain Location Leg    Pain Orientation Left;Right    Pain Descriptors / Indicators Radiating;Numbness    Pain Type Chronic pain    Pain Onset More than a month ago    Pain Frequency Constant    Aggravating Factors  overdoing    Pain Relieving Factors rest and meds, pool            Pt seen for aquatic therapy today.  Treatment took place in water 3.25-4.8 ft in depth at the [employer] [facility] pool. Temp of water was 91.  Pt entered/exited the pool via stairs step through pattern cga with bilat rail.   Warm up: water walking forward, backward and sideways with increased step length, speed and positioning of ue.   Seated: stretching of gastroc , hamstring and LB. Knee flex/ext and add /abd with foam cuffs x 10 reps.    Superman position Supported on hands on bench pt facilitated on knees to chest for LB stretching with manual asst 3 x 30 secs, then core strengthening x 10 reps. Knees to opposite shoulder x 5 reps.    Standing Noodle kick down 2 x 10 reps hip flex then hip  flex with external hip rotation.  Cuing for abdominal and opposite hip contraction/tight. Hip flex stretch 3 x 30 sec hold. Full hip and core ext 3 x 30 hold bilaterally. Hip strengthening same position as above, hip pull through  2x10 reps bilat. Hip abd/add using ankle buoys x 10 reps Kick board push downs 2 x 10  Supine suspension Lateral pulls right and left x 10 reps   Pt requires buoyancy for support and to offload joints with strengthening exercises. Viscosity of the water is needed for resistance of strengthening; water current perturbations provides challenge to standing balance unsupported, requiring increased core activation.                                  PT Short Term Goals - 08/29/20 1639       PT SHORT TERM GOAL #1   Title The patient will be able to initiate a basic HEP for core and LE strengthening (especially) right LE to  facilitate return to ADLs    Status Achieved      PT SHORT TERM GOAL #2   Title The patient will report a 30% improvement in back and right LE pain particularly at the end of the day and with household chores including doing the dishwasher    Status Achieved      PT SHORT TERM GOAL #3   Title FOTO functional score improved from 33% to 43% indicating improved function with less pain    Status Achieved      PT SHORT TERM GOAL #4   Title The patient will be able to walk 8-10 minutes unassisted    Status Achieved      PT SHORT TERM GOAL #5   Title The patient will have improved spinal flexion to 60 degrees, extension to 20 degrees and bil sidebending to 20 degrees    Status Partially Met               PT Long Term Goals - 08/29/20 1639       PT LONG TERM GOAL #1   Title The patient will be independent with safe self progression of HEP( land and water based ex)    Time 16    Period Weeks    Status On-going    Target Date 12/19/20      PT LONG TERM GOAL #2   Title The patient will report a 50% improvement in back and right LE symptoms at the end of the day and with household chores    Time 16    Period Weeks    Status Revised      PT LONG TERM GOAL #3   Title The patient will have improved right LE motor control and core strength grossly 4 to 4+/5 needed for sit to stand, reaching, carrying light bags    Time 16    Period Weeks    Status Revised      PT LONG TERM GOAL #4   Title The patient will demonstrate improved activity tolerance for  walking 20 minutes or 1.5 miles at a faster pace    Time 16    Period Weeks    Status Revised      PT LONG TERM GOAL #5   Title FOTO functional outcome score improved to 57% indicating improved function    Time 16    Period Weeks    Status Revised                     Plan - 09/15/20 1322     PT Treatment/Interventions ADLs/Self Care Home Management;Aquatic Therapy;Cryotherapy;Electrical Stimulation;Ultrasound;Moist  Heat;Iontophoresis 4mg/ml Dexamethasone;Functional mobility training;Therapeutic activities;Therapeutic exercise;Neuromuscular re-education;Manual techniques;Patient/family education;Taping;Dry needling            Clinical Asessment Pt with improving strength as demonstrated by sustaining longer activation and holding of core muscles with lateral spine contractions in supine suspension and increased reps of most exercises. We did discuss energy conservation techniques and not to overdo early in day exhausting her for the rest of the day. Pt complained less of nerve pain in LE's with increased intensity.   Patient will benefit from skilled therapeutic intervention in order to improve the following deficits and impairments:  Decreased range of motion, Difficulty walking, Increased fascial restricitons, Increased muscle spasms, Decreased activity tolerance, Impaired perceived functional ability, Pain, Decreased strength  Visit Diagnosis: Acute right-sided low back pain with right-sided sciatica  Muscle spasm of back  Muscle weakness (generalized)  Difficulty in walking, not elsewhere classified     Problem List Patient Active Problem List   Diagnosis Date Noted   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 03/09/2020   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    

## 2020-09-26 ENCOUNTER — Ambulatory Visit (HOSPITAL_BASED_OUTPATIENT_CLINIC_OR_DEPARTMENT_OTHER): Payer: 59 | Admitting: Physical Therapy

## 2020-10-20 ENCOUNTER — Ambulatory Visit: Payer: 59 | Attending: Neurosurgery | Admitting: Physical Therapy

## 2020-10-20 ENCOUNTER — Other Ambulatory Visit: Payer: Self-pay

## 2020-10-20 DIAGNOSIS — M6281 Muscle weakness (generalized): Secondary | ICD-10-CM | POA: Diagnosis present

## 2020-10-20 DIAGNOSIS — M5441 Lumbago with sciatica, right side: Secondary | ICD-10-CM | POA: Diagnosis not present

## 2020-10-20 DIAGNOSIS — R262 Difficulty in walking, not elsewhere classified: Secondary | ICD-10-CM | POA: Insufficient documentation

## 2020-10-20 DIAGNOSIS — M62830 Muscle spasm of back: Secondary | ICD-10-CM | POA: Insufficient documentation

## 2020-10-20 NOTE — Therapy (Signed)
[facility] [address] [place], [place], [address] Phone: [phone]   Fax:  [phone]  Physical Therapy Treatment  Patient Details  Name: Taylor Sharp MRN: [id number] Date of Birth: September 26, 1960 Referring Provider (PT): Dr. Nikki Dom   Encounter Date: 10/20/2020   PT End of Session - 10/20/20 1223     Visit Number 23    Number of Visits 30    Date for PT Re-Evaluation 12/19/20    Authorization Type [employer] 30 visit limit    PT Start Time 0925    PT Stop Time 1012    PT Time Calculation (min) 47 min    Activity Tolerance Patient limited by pain             Past Medical History:  Diagnosis Date   Asthma     Past Surgical History:  Procedure Laterality Date   ABLATION     IR ANGIO/SPINAL LEFT  03/11/2020   IR ANGIO/SPINAL LEFT  03/11/2020   IR ANGIO/SPINAL LEFT  03/11/2020   IR ANGIO/SPINAL LEFT  03/11/2020   IR ANGIO/SPINAL LEFT  03/11/2020   IR ANGIO/SPINAL LEFT  03/11/2020   IR ANGIO/SPINAL LEFT  03/11/2020   IR ANGIO/SPINAL RIGHT  03/11/2020   IR ANGIO/SPINAL RIGHT  03/11/2020   IR ANGIO/SPINAL RIGHT  03/11/2020   IR ANGIO/SPINAL RIGHT  03/11/2020   IR ANGIO/SPINAL RIGHT  03/11/2020   IR ANGIO/SPINAL RIGHT  03/11/2020   IR ANGIOGRAM SELECTIVE EACH ADDITIONAL VESSEL  03/11/2020   IR ANGIOGRAM SELECTIVE EACH ADDITIONAL VESSEL  03/11/2020   IR US GUIDE VASC ACCESS RIGHT  03/11/2020   RADIOLOGY WITH ANESTHESIA N/A 03/11/2020   Procedure: IR WITH ANESTHESIA;  Surgeon: Radiologist, Medication, MD;  Location: [place];  Service: Radiology;  Laterality: N/A;    There were no vitals filed for this visit.   Subjective Assessment - 10/20/20 0925     Subjective I had a great time 2 weeks at the beach.  Got in the pool every day.  Walked multi directions.  I couldn't walk on the beach much.  Walking at [employer] was difficult.  Coming home was really hard.   I came home and got shingles.  Got a B12 shot too.    Pertinent  History Gabapentin and Robaxin;  no current restrictions;  typically hypermobile; left hip bursitis; osteopenia    How long can you walk comfortably? 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 Yes    Pain Score 5    rough night last night   Pain Location Leg    Pain Type Chronic pain    Pain Relieving Factors partial inverse table rocking back and forth                               [facility] Adult PT Treatment/Exercise - 10/20/20 0001       Lumbar Exercises: Seated   Other Seated Lumbar Exercises manually resisted trunk extension 5x 5 sec holds    Other Seated Lumbar Exercises self resisted heel raise 10x right/left      Lumbar Exercises: Supine   AB Set Limitations UE beats  30 sec    Pelvic Tilt Limitations ball squeeze 10x    Other Supine Lumbar Exercises red band overhead with diagonal extensions 5x each side    Other Supine Lumbar Exercises red band overhead lat pull downs 5x      Lumbar Exercises: Prone   Other Prone Lumbar Exercises pt demo plank on elbows and knees approx 10 sec she has been working on at home      Shoulder Exercises: Supine   Protraction Limitations chest press dowel 3# 8x    Other Supine Exercises UE rhythmic stab 8x UE    Other Supine Exercises LE rhythmic stab at the knees 8x                      PT Short Term Goals - 08/29/20 1639       PT SHORT TERM GOAL #1   Title The patient will be able to initiate a basic HEP for core and LE strengthening (especially) right LE to facilitate return to ADLs    Status Achieved      PT SHORT TERM GOAL #2   Title The patient will report a 30% improvement in back and right LE pain particularly at the end of the day and with household chores including doing  the dishwasher    Status Achieved      PT SHORT TERM GOAL #3   Title FOTO functional score improved from 33% to 43% indicating improved function with less pain    Status Achieved      PT SHORT TERM GOAL #4   Title The patient will be able to walk 8-10 minutes unassisted    Status Achieved      PT SHORT TERM GOAL #5   Title The patient will have improved spinal flexion to 60 degrees, extension to 20 degrees and bil sidebending to 20 degrees    Status Partially Met               PT Long Term Goals - 08/29/20 1639       PT LONG TERM GOAL #1   Title The patient will be independent with safe self progression of HEP( land and water based ex)    Time 16    Period Weeks    Status On-going    Target Date 12/19/20      PT LONG TERM GOAL #2   Title The patient will report a 50% improvement in back and right LE symptoms at the end of the day and with household chores    Time 16    Period Weeks    Status Revised      PT LONG TERM GOAL #3   Title The patient will have improved right LE motor control and core strength grossly 4 to 4+/5 needed for sit to stand, reaching, carrying light bags    Time 16    Period Weeks    Status Revised      PT LONG TERM GOAL #4   Title The patient will demonstrate improved activity tolerance for  walking 20 minutes or 1.5 miles at a faster pace    Time 16    Period Weeks    Status Revised      PT LONG TERM GOAL #5   Title FOTO functional outcome score improved to 57% indicating improved function    Time 16    Period Weeks    Status Revised  Plan - 10/20/20 1224     Clinical Impression Statement The patient returns after a gap in care secondary to a trip to the beach followed by shingles on the side of her neck and head.  She had a positive experience walking in the pool while on vacation.  She has also been working with a Geophysicist/field seismologist and Art gallery manager.  She has also found that rocking back and forth on her  home inversion table seems helpful.  We discussed that building "a team" to help her manage this problem is a great approach.  She is fairly tolerant to supine ex's although she continues to have LE tingling for most of the session.  Weakness noted  in lower abdominals with rotation isometrics as well as when she demonstrated the prone plank she's been working on.  Weakness also noted in lumbar extensors (noted with manually resisted seated).  Therapist closely monitoring response and modifying based on pain, LE paresthesia and fatigue level.    Personal Factors and Comorbidities Comorbidity 1;Profession;Comorbidity 2;Comorbidity 3+;Past/Current Experience    Comorbidities osteopenia; hx of left hip bursitis; anxiety    Rehab Potential Good    PT Frequency Biweekly    PT Duration Other (comment)    PT Treatment/Interventions ADLs/Self Care Home Management;Aquatic Therapy;Cryotherapy;Electrical Stimulation;Ultrasound;Moist Heat;Iontophoresis 37m/ml Dexamethasone;Functional mobility training;Therapeutic activities;Therapeutic exercise;Neuromuscular re-education;Manual techniques;Patient/family education;Taping;Dry needling    PT Next Visit Plan aquatic ex and land based ex spaced out every 2-3 weeks with awareness of 30 visit insurance limit    PT Home Exercise Plan Using noodle and steps in water. HEP for core strengthening with concntration on left hip/buttock and right quad             Patient will benefit from skilled therapeutic intervention in order to improve the following deficits and impairments:  Decreased range of motion, Difficulty walking, Increased fascial restricitons, Increased muscle spasms, Decreased activity tolerance, Impaired perceived functional ability, Pain, Decreased strength  Visit Diagnosis: Acute right-sided low back pain with right-sided sciatica  Muscle spasm of back  Muscle weakness (generalized)  Difficulty in walking, not elsewhere classified     Problem  List Patient Active Problem List   Diagnosis Date Noted   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 03/09/2020   A-V fistula ([place] 03/09/2020   Intractable low back pain 03/09/2020   Left hip pain 12/10/2018   Osteopenia 05/11/2018   Bursitis of hip 02/18/2017   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 [place] [address]Phone: [phone]  Fax:  [phone] Name: Taylor SergentMRN: [id number] of Birth: 1April 04, 1962

## 2020-10-31 ENCOUNTER — Encounter (HOSPITAL_BASED_OUTPATIENT_CLINIC_OR_DEPARTMENT_OTHER): Payer: Self-pay | Admitting: Physical Therapy

## 2020-10-31 ENCOUNTER — Ambulatory Visit (HOSPITAL_BASED_OUTPATIENT_CLINIC_OR_DEPARTMENT_OTHER): Payer: 59 | Attending: Neurosurgery | Admitting: Physical Therapy

## 2020-10-31 ENCOUNTER — Other Ambulatory Visit: Payer: Self-pay

## 2020-10-31 DIAGNOSIS — R262 Difficulty in walking, not elsewhere classified: Secondary | ICD-10-CM | POA: Insufficient documentation

## 2020-10-31 DIAGNOSIS — M62830 Muscle spasm of back: Secondary | ICD-10-CM | POA: Diagnosis present

## 2020-10-31 DIAGNOSIS — M6281 Muscle weakness (generalized): Secondary | ICD-10-CM | POA: Diagnosis present

## 2020-10-31 DIAGNOSIS — M5441 Lumbago with sciatica, right side: Secondary | ICD-10-CM | POA: Insufficient documentation

## 2020-10-31 NOTE — Therapy (Signed)
[facility] [place], [place], [address] Phone: [phone]   Fax:  [phone]  Physical Therapy Treatment  Patient Details  Name: Taylor Sharp MRN: [id number] Date of Birth: 03/13/1960 Referring Provider (PT): Dr. Nikki Dom   Encounter Date: 10/31/2020   PT End of Session - 10/31/20 0948     Visit Number 24    Number of Visits 30    Date for PT Re-Evaluation 12/19/20    Authorization Type [employer] 30 visit limit    PT Start Time 0900    PT Stop Time 0945    PT Time Calculation (min) 45 min             Past Medical History:  Diagnosis Date   Asthma     Past Surgical History:  Procedure Laterality Date   ABLATION     IR ANGIO/SPINAL LEFT  03/11/2020   IR ANGIO/SPINAL LEFT  03/11/2020   IR ANGIO/SPINAL LEFT  03/11/2020   IR ANGIO/SPINAL LEFT  03/11/2020   IR ANGIO/SPINAL LEFT  03/11/2020   IR ANGIO/SPINAL LEFT  03/11/2020   IR ANGIO/SPINAL LEFT  03/11/2020   IR ANGIO/SPINAL RIGHT  03/11/2020   IR ANGIO/SPINAL RIGHT  03/11/2020   IR ANGIO/SPINAL RIGHT  03/11/2020   IR ANGIO/SPINAL RIGHT  03/11/2020   IR ANGIO/SPINAL RIGHT  03/11/2020   IR ANGIO/SPINAL RIGHT  03/11/2020   IR ANGIOGRAM SELECTIVE EACH ADDITIONAL VESSEL  03/11/2020   IR ANGIOGRAM SELECTIVE EACH ADDITIONAL VESSEL  03/11/2020   IR US GUIDE VASC ACCESS RIGHT  03/11/2020   RADIOLOGY WITH ANESTHESIA N/A 03/11/2020   Procedure: IR WITH ANESTHESIA;  Surgeon: Radiologist, Medication, MD;  Location: [facility];  Service: Radiology;  Laterality: N/A;    There were no vitals filed for this visit.   Subjective Assessment - 10/31/20 0917     Subjective "Have had shingles and cellulitis ince I have seen you.  May have a yeast infection as we speak.  Legs are a little more tingley, back is a little better.    Currently in Pain? Yes    Pain Score 6     Pain Location Leg    Pain Descriptors / Indicators Tingling;Radiating    Pain Type Chronic pain    Pain Relieving Factors  Discomofrt lower when not moving 2-3/10                                         PT Short Term Goals - 08/29/20 1639       PT SHORT TERM GOAL #1   Title The patient will be able to initiate a basic HEP for core and LE strengthening (especially) right LE to facilitate return to ADLs    Status Achieved      PT SHORT TERM GOAL #2   Title The patient will report a 30% improvement in back and right LE pain particularly at the end of the day and with household chores including doing the dishwasher    Status Achieved      PT SHORT TERM GOAL #3   Title FOTO functional score improved from 33% to 43% indicating improved function with less pain    Status Achieved      PT SHORT TERM GOAL #4   Title The patient will be able to walk 8-10 minutes unassisted    Status Achieved      PT  SHORT TERM GOAL #5   Title The patient will have improved spinal flexion to 60 degrees, extension to 20 degrees and bil sidebending to 20 degrees    Status Partially Met               PT Long Term Goals - 08/29/20 1639       PT LONG TERM GOAL #1   Title The patient will be independent with safe self progression of HEP( land and water based ex)    Time 16    Period Weeks    Status On-going    Target Date 12/19/20      PT LONG TERM GOAL #2   Title The patient will report a 50% improvement in back and right LE symptoms at the end of the day and with household chores    Time 16    Period Weeks    Status Revised      PT LONG TERM GOAL #3   Title The patient will have improved right LE motor control and core strength grossly 4 to 4+/5 needed for sit to stand, reaching, carrying light bags    Time 16    Period Weeks    Status Revised      PT LONG TERM GOAL #4   Title The patient will demonstrate improved activity tolerance for  walking 20 minutes or 1.5 miles at a faster pace    Time 16    Period Weeks    Status Revised      PT LONG TERM GOAL #5   Title FOTO  functional outcome score improved to 57% indicating improved function    Time 16    Period Weeks    Status Revised            Pt seen for aquatic therapy today.  Treatment took place in water 3.25-4.8 ft in depth at the [employer] [facility] pool. Temp of water was 91.  Pt entered/exited the pool via stairs step through pattern cga with bilat rail.   Warm up: water walking forward, backward and sideways with increased step length, speed and positioning of ue.   Seated: stretching of gastroc , hamstring and LB. Knee flex/ext and add /abd with foam cuffs x 10 reps.     Superman position Supported on hands on bench pt facilitated on knees to chest for LB stretching with manual asst 3 x 30 secs, then core strengthening x 10 reps. Knees to opposite shoulder x 5 reps. Adapted pt to using extra noodle to complete indep.  Completes with some difficulty but is adequate for HEP   Standing Noodle kick down 2 x 10 reps hip flex then hip flex with external hip rotation.  Cuing for abdominal and opposite hip contraction/tight. Hip flex stretch 3 x 30 sec hold. Tricep press on pool edge 50% submerged Kick board push downs 2 x 10 Kick board lateral pull x 10  VC and TC for proper tech and execution.   Pt fatigued upon completion.     Pt requires buoyancy for support and to offload joints with strengthening exercises. Viscosity of the water is needed for resistance of strengthening; water current perturbations provides challenge to standing balance unsupported, requiring increased core activation.       Plan - 10/31/20 1317     Clinical Impression Statement Pt compliant with HEP while on vacation.  She has a working understanding for [employer] and completion.  Reporting various afflictions since last visit as well as a possible  yeast infection.  Focus on progessing stregthening and promoting pain relief.  Time spent on adapting activities she is assted with by therapist to completin indep at home  pool.  She does have increased rle tigling today while session continues, limiting her ability to tolerate.    Personal Factors and Comorbidities Comorbidity 1;Profession;Comorbidity 2;Comorbidity 3+;Past/Current Experience    PT Treatment/Interventions ADLs/Self Care Home Management;Aquatic Therapy;Cryotherapy;Electrical Stimulation;Ultrasound;Moist Heat;Iontophoresis 28m/ml Dexamethasone;Functional mobility training;Therapeutic activities;Therapeutic exercise;Neuromuscular re-education;Manual techniques;Patient/family education;Taping;Dry needling             Patient will benefit from skilled therapeutic intervention in order to improve the following deficits and impairments:  Decreased range of motion, Difficulty walking, Increased fascial restricitons, Increased muscle spasms, Decreased activity tolerance, Impaired perceived functional ability, Pain, Decreased strength  Visit Diagnosis: Acute right-sided low back pain with right-sided sciatica  Muscle spasm of back  Muscle weakness (generalized)  Difficulty in walking, not elsewhere classified     Problem List Patient Active Problem List   Diagnosis Date Noted   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 03/09/2020   A-V fistula ([place] 03/09/2020   Intractable low back pain 03/09/2020   Left hip pain 12/10/2018   Osteopenia 05/11/2018   Bursitis of hip 02/18/2017   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

## 2020-11-17 ENCOUNTER — Other Ambulatory Visit: Payer: Self-pay

## 2020-11-17 ENCOUNTER — Ambulatory Visit: Payer: 59 | Attending: Neurosurgery | Admitting: Physical Therapy

## 2020-11-17 DIAGNOSIS — M62830 Muscle spasm of back: Secondary | ICD-10-CM | POA: Diagnosis present

## 2020-11-17 DIAGNOSIS — M6281 Muscle weakness (generalized): Secondary | ICD-10-CM | POA: Insufficient documentation

## 2020-11-17 DIAGNOSIS — M5441 Lumbago with sciatica, right side: Secondary | ICD-10-CM | POA: Insufficient documentation

## 2020-11-17 DIAGNOSIS — R262 Difficulty in walking, not elsewhere classified: Secondary | ICD-10-CM | POA: Insufficient documentation

## 2020-11-17 NOTE — Therapy (Signed)
[facility] [address], [place], [place], [address] Phone: [phone]   Fax:  [phone]  Physical Therapy Treatment  Patient Details  Name: Taylor Sharp MRN: [id number] Date of Birth: 02-13-1961 Referring Provider (PT): Dr. Nikki Dom   Encounter Date: 11/17/2020   PT End of Session - 11/17/20 1235     Visit Number 25    Number of Visits 30    Date for PT Re-Evaluation 12/19/20    Authorization Type [employer] 30 visit limit    PT Start Time 0930    PT Stop Time 1014    PT Time Calculation (min) 44 min    Activity Tolerance Patient limited by pain             Past Medical History:  Diagnosis Date   Asthma     Past Surgical History:  Procedure Laterality Date   ABLATION     IR ANGIO/SPINAL LEFT  03/11/2020   IR ANGIO/SPINAL LEFT  03/11/2020   IR ANGIO/SPINAL LEFT  03/11/2020   IR ANGIO/SPINAL LEFT  03/11/2020   IR ANGIO/SPINAL LEFT  03/11/2020   IR ANGIO/SPINAL LEFT  03/11/2020   IR ANGIO/SPINAL LEFT  03/11/2020   IR ANGIO/SPINAL RIGHT  03/11/2020   IR ANGIO/SPINAL RIGHT  03/11/2020   IR ANGIO/SPINAL RIGHT  03/11/2020   IR ANGIO/SPINAL RIGHT  03/11/2020   IR ANGIO/SPINAL RIGHT  03/11/2020   IR ANGIO/SPINAL RIGHT  03/11/2020   IR ANGIOGRAM SELECTIVE EACH ADDITIONAL VESSEL  03/11/2020   IR ANGIOGRAM SELECTIVE EACH ADDITIONAL VESSEL  03/11/2020   IR US GUIDE VASC ACCESS RIGHT  03/11/2020   RADIOLOGY WITH ANESTHESIA N/A 03/11/2020   Procedure: IR WITH ANESTHESIA;  Surgeon: Radiologist, Medication, MD;  Location: [facility];  Service: Radiology;  Laterality: N/A;    There were no vitals filed for this visit.   Subjective Assessment - 11/17/20 0935     Subjective Feeling tired.  Unsure of why.  Will see a pain doctor this month.  Still have to catch myself from falling backward.  Pain and tingling in both legs.  Feels cold.    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 Yes    Pain Score 5     Pain Location Leg    Aggravating Factors  worse in evenings; hard to go to sleep                               [facility] Adult PT Treatment/Exercise - 11/17/20 0001       Lumbar Exercises: Standing   Other Standing Lumbar Exercises mini push ups on tall table 8x      Lumbar Exercises: Seated   Other Seated Lumbar Exercises manually resisted trunk extension 5x 5 sec holds    Other Seated Lumbar Exercises ab push down on back of chair 8x      Lumbar Exercises: Supine   AB Set Limitations UE beats 30 sec    Pelvic Tilt Limitations --    Other Supine Lumbar Exercises red band pallof press 5x each side  Other Supine Lumbar Exercises red band overhead lat pull downs 5x                       PT Short Term Goals - 08/29/20 1639       PT SHORT TERM GOAL #1   Title The patient will be able to initiate a basic HEP for core and LE strengthening (especially) right LE to facilitate return to ADLs    Status Achieved      PT SHORT TERM GOAL #2   Title The patient will report a 30% improvement in back and right LE pain particularly at the end of the day and with household chores including doing the dishwasher    Status Achieved      PT SHORT TERM GOAL #3   Title FOTO functional score improved from 33% to 43% indicating improved function with less pain    Status Achieved      PT SHORT TERM GOAL #4   Title The patient will be able to walk 8-10 minutes unassisted    Status Achieved      PT SHORT TERM GOAL #5   Title The patient will have improved spinal flexion to 60 degrees, extension to 20 degrees and bil sidebending to 20 degrees    Status Partially Met                PT Long Term Goals - 08/29/20 1639       PT LONG TERM GOAL #1   Title The patient will be independent with safe self progression of HEP( land and water based ex)    Time 16    Period Weeks    Status On-going    Target Date 12/19/20      PT LONG TERM GOAL #2   Title The patient will report a 50% improvement in back and right LE symptoms at the end of the day and with household chores    Time 16    Period Weeks    Status Revised      PT LONG TERM GOAL #3   Title The patient will have improved right LE motor control and core strength grossly 4 to 4+/5 needed for sit to stand, reaching, carrying light bags    Time 16    Period Weeks    Status Revised      PT LONG TERM GOAL #4   Title The patient will demonstrate improved activity tolerance for  walking 20 minutes or 1.5 miles at a faster pace    Time 16    Period Weeks    Status Revised      PT LONG TERM GOAL #5   Title FOTO functional outcome score improved to 57% indicating improved function    Time 16    Period Weeks    Status Revised                   Plan - 11/17/20 1236     Clinical Impression Statement The patient has LE tingling and pain throughout treatment session despite the position and even at rest.  Repetions kept low secondary to excessive fatigue levels.  Therapist modifying treatment based on response.  Discussed graded activtiy at home including mid day rest to avoid late afternoon exhaustion and higher pain levels.    Personal Factors and Comorbidities Comorbidity 1;Profession;Comorbidity 2;Comorbidity 3+;Past/Current Experience    Comorbidities osteopenia; hx of left hip bursitis; anxiety    Examination-Activity Limitations Locomotion Level;Bed  Mobility;Reach Overhead;Bend;Caring for Others;Carry;Sleep;Lift;Stand;Hygiene/Grooming;Squat    Examination-Participation Restrictions Meal Prep;Cleaning;Occupation;Community Activity;Driving;Laundry    Rehab Potential Good    PT Frequency  Biweekly    PT Duration Other (comment)    PT Treatment/Interventions ADLs/Self Care Home Management;Aquatic Therapy;Cryotherapy;Electrical Stimulation;Ultrasound;Moist Heat;Iontophoresis 41m/ml Dexamethasone;Functional mobility training;Therapeutic activities;Therapeutic exercise;Neuromuscular re-education;Manual techniques;Patient/family education;Taping;Dry needling    PT Next Visit Plan aquatic ex and land based ex spaced out every 2-3 weeks with awareness of 30 visit insurance limit             Patient will benefit from skilled therapeutic intervention in order to improve the following deficits and impairments:  Decreased range of motion, Difficulty walking, Increased fascial restricitons, Increased muscle spasms, Decreased activity tolerance, Impaired perceived functional ability, Pain, Decreased strength  Visit Diagnosis: Acute right-sided low back pain with right-sided sciatica  Muscle spasm of back  Muscle weakness (generalized)  Difficulty in walking, not elsewhere classified     Problem List Patient Active Problem List   Diagnosis Date Noted   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 03/09/2020   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 [place] [address]Phone: [phone]  Fax:  [phone] Name: RTattianna SchnarrMRN: [id number] of Birth: [birth year]

## 2020-11-28 ENCOUNTER — Encounter (HOSPITAL_BASED_OUTPATIENT_CLINIC_OR_DEPARTMENT_OTHER): Payer: Self-pay | Admitting: Physical Therapy

## 2020-11-28 ENCOUNTER — Other Ambulatory Visit: Payer: Self-pay

## 2020-11-28 ENCOUNTER — Ambulatory Visit (HOSPITAL_BASED_OUTPATIENT_CLINIC_OR_DEPARTMENT_OTHER): Payer: 59 | Attending: Neurosurgery | Admitting: Physical Therapy

## 2020-11-28 DIAGNOSIS — R262 Difficulty in walking, not elsewhere classified: Secondary | ICD-10-CM | POA: Insufficient documentation

## 2020-11-28 DIAGNOSIS — M5441 Lumbago with sciatica, right side: Secondary | ICD-10-CM | POA: Diagnosis not present

## 2020-11-28 DIAGNOSIS — M62830 Muscle spasm of back: Secondary | ICD-10-CM | POA: Insufficient documentation

## 2020-11-28 DIAGNOSIS — M6281 Muscle weakness (generalized): Secondary | ICD-10-CM | POA: Diagnosis present

## 2020-11-28 NOTE — Therapy (Signed)
[facility] [address] [place], [place], [address] Phone: [phone]   Fax:  [phone]  Physical Therapy Treatment  Patient Details  Name: Taylor Sharp MRN: [id number] Date of Birth: March 08, 1961 Referring Provider (PT): Dr. Nikki Dom   Encounter Date: 11/28/2020   PT End of Session - 11/28/20 0907     Visit Number 26    Number of Visits 30    Date for PT Re-Evaluation 12/19/20    Authorization Type [employer] 30 visit limit    PT Start Time 0930    PT Stop Time 1019    PT Time Calculation (min) 49 min    Equipment Utilized During Treatment Other (comment)    Activity Tolerance Patient limited by pain    Behavior During Therapy [facility] for tasks assessed/performed             Past Medical History:  Diagnosis Date   Asthma     Past Surgical History:  Procedure Laterality Date   ABLATION     IR ANGIO/SPINAL LEFT  03/11/2020   IR ANGIO/SPINAL LEFT  03/11/2020   IR ANGIO/SPINAL LEFT  03/11/2020   IR ANGIO/SPINAL LEFT  03/11/2020   IR ANGIO/SPINAL LEFT  03/11/2020   IR ANGIO/SPINAL LEFT  03/11/2020   IR ANGIO/SPINAL LEFT  03/11/2020   IR ANGIO/SPINAL RIGHT  03/11/2020   IR ANGIO/SPINAL RIGHT  03/11/2020   IR ANGIO/SPINAL RIGHT  03/11/2020   IR ANGIO/SPINAL RIGHT  03/11/2020   IR ANGIO/SPINAL RIGHT  03/11/2020   IR ANGIO/SPINAL RIGHT  03/11/2020   IR ANGIOGRAM SELECTIVE EACH ADDITIONAL VESSEL  03/11/2020   IR ANGIOGRAM SELECTIVE EACH ADDITIONAL VESSEL  03/11/2020   IR US GUIDE VASC ACCESS RIGHT  03/11/2020   RADIOLOGY WITH ANESTHESIA N/A 03/11/2020   Procedure: IR WITH ANESTHESIA;  Surgeon: Radiologist, Medication, MD;  Location: [facility];  Service: Radiology;  Laterality: N/A;    There were no vitals filed for this visit.   Subjective Assessment - 11/28/20 0946     Subjective "my overall pain is up but I am doing more. I am getting frustrated".    Pain Score 7     Pain Location Leg    Pain Orientation Right;Left    Pain Descriptors /  Indicators Aching;Burning;Tingling;Crying    Pain Type Chronic pain    Pain Radiating Towards R>L                                          PT Short Term Goals - 08/29/20 1639       PT SHORT TERM GOAL #1   Title The patient will be able to initiate a basic HEP for core and LE strengthening (especially) right LE to facilitate return to ADLs    Status Achieved      PT SHORT TERM GOAL #2   Title The patient will report a 30% improvement in back and right LE pain particularly at the end of the day and with household chores including doing the dishwasher    Status Achieved      PT SHORT TERM GOAL #3   Title FOTO functional score improved from 33% to 43% indicating improved function with less pain    Status Achieved      PT SHORT TERM GOAL #4   Title The patient will be able to walk 8-10 minutes unassisted    Status Achieved  PT SHORT TERM GOAL #5   Title The patient will have improved spinal flexion to 60 degrees, extension to 20 degrees and bil sidebending to 20 degrees    Status Partially Met               PT Long Term Goals - 08/29/20 1639       PT LONG TERM GOAL #1   Title The patient will be independent with safe self progression of HEP( land and water based ex)    Time 16    Period Weeks    Status On-going    Target Date 12/19/20      PT LONG TERM GOAL #2   Title The patient will report a 50% improvement in back and right LE symptoms at the end of the day and with household chores    Time 16    Period Weeks    Status Revised      PT LONG TERM GOAL #3   Title The patient will have improved right LE motor control and core strength grossly 4 to 4+/5 needed for sit to stand, reaching, carrying light bags    Time 16    Period Weeks    Status Revised      PT LONG TERM GOAL #4   Title The patient will demonstrate improved activity tolerance for  walking 20 minutes or 1.5 miles at a faster pace    Time 16    Period Weeks     Status Revised      PT LONG TERM GOAL #5   Title FOTO functional outcome score improved to 57% indicating improved function    Time 16    Period Weeks    Status Revised              Pt seen for aquatic therapy today.  Treatment took place in water 3.25-4.8 ft in depth at the [facility] pool. Temp of water was 91.  Pt entered/exited the pool via stairs step through pattern cga with bilat rail.   Warm up: water walking forward, backward and sideways with increased step length, speed and positioning of ue.   Seated: stretching of gastroc , hamstring and LB. Knee flex/ext and add /abd with foam cuffs x 10 reps.     Suspended Completed in lap pool needed for the added depth. Core strengthening: -Prone to Supine Transition with Foam Dumbells and Ankle Floats: broken into segments:      -using 2 foam hand buoys standing to sup<>standing x 5 holding position x 10 seconds      -Knees to chest x 10 holding above position      -Standing<>prone x 5 holding position x 10 seconds      -prone to supine transition x 5 Pt requiring vc, tc and demonstration for proper execution.  Multiple tries for proper completion  Side to Side Pendulum Swing with Foam Dumbbells and Ankle Floats      Pt fatigued upon completion.     Pt requires buoyancy for support and to offload joints with strengthening exercises. Viscosity of the water is needed for resistance of strengthening; water current perturbations provides challenge to standing balance unsupported, requiring increased core activation.       Plan - 11/28/20 0952     Clinical Impression Statement Pt reports only slight decrease in pain after today session to 5-6/10.  She is able to complete added core strength and balance activities although with increased c/o tingling and pain in rle.  She reports staying very active but tires easily.  Pt instruction on energy conservation strategies.    Examination-Activity Limitations Locomotion  Level;Bed Mobility;Reach Overhead;Bend;Caring for Others;Carry;Sleep;Lift;Stand;Hygiene/Grooming;Squat    Stability/Clinical Decision Making Evolving/Moderate complexity    Clinical Decision Making Moderate    Rehab Potential Good    PT Frequency Biweekly    PT Treatment/Interventions ADLs/Self Care Home Management;Aquatic Therapy;Cryotherapy;Electrical Stimulation;Ultrasound;Moist Heat;Iontophoresis 28m/ml Dexamethasone;Functional mobility training;Therapeutic activities;Therapeutic exercise;Neuromuscular re-education;Manual techniques;Patient/family education;Taping;Dry needling    PT Next Visit Plan aquatic ex and land based ex spaced out every 2-3 weeks with awareness of 30 visit insurance limit    PT Home Exercise Plan Using noodle and steps in water. HEP for core strengthening with concntration on left hip/buttock and right quad             Patient will benefit from skilled therapeutic intervention in order to improve the following deficits and impairments:  Decreased range of motion, Difficulty walking, Increased fascial restricitons, Increased muscle spasms, Decreased activity tolerance, Impaired perceived functional ability, Pain, Decreased strength  Visit Diagnosis: Acute right-sided low back pain with right-sided sciatica  Muscle spasm of back  Muscle weakness (generalized)  Difficulty in walking, not elsewhere classified     Problem List Patient Active Problem List   Diagnosis Date Noted   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 03/09/2020   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 [place] [address]Phone: [phone]  Fax:  [phone] Name: Taylor DavoliMRN: [id number] of Birth: 109/19/1962

## 2020-12-08 ENCOUNTER — Other Ambulatory Visit: Payer: Self-pay

## 2020-12-08 ENCOUNTER — Ambulatory Visit (HOSPITAL_BASED_OUTPATIENT_CLINIC_OR_DEPARTMENT_OTHER): Payer: 59 | Admitting: Physical Therapy

## 2020-12-08 ENCOUNTER — Encounter (HOSPITAL_BASED_OUTPATIENT_CLINIC_OR_DEPARTMENT_OTHER): Payer: Self-pay | Admitting: Physical Therapy

## 2020-12-08 DIAGNOSIS — M5441 Lumbago with sciatica, right side: Secondary | ICD-10-CM | POA: Diagnosis not present

## 2020-12-08 DIAGNOSIS — R262 Difficulty in walking, not elsewhere classified: Secondary | ICD-10-CM

## 2020-12-08 DIAGNOSIS — M6281 Muscle weakness (generalized): Secondary | ICD-10-CM

## 2020-12-08 DIAGNOSIS — M62830 Muscle spasm of back: Secondary | ICD-10-CM

## 2020-12-08 NOTE — Therapy (Signed)
[name], [place], [address] Phone: [phone]   Fax:  [phone]  Physical Therapy Treatment  Patient Details  Name: Taylor Sharp MRN: [id number] Date of Birth: 05/22/1960 Referring Provider (PT): Dr. Nikki Dom   Encounter Date: 12/08/2020   PT End of Session - 12/08/20 1116     Visit Number 27    Number of Visits 30    Date for PT Re-Evaluation 12/19/20    Authorization Type [employer] 30 visit limit    PT Start Time 1030    PT Stop Time 1115    PT Time Calculation (min) 45 min             Past Medical History:  Diagnosis Date   Asthma     Past Surgical History:  Procedure Laterality Date   ABLATION     IR ANGIO/SPINAL LEFT  03/11/2020   IR ANGIO/SPINAL LEFT  03/11/2020   IR ANGIO/SPINAL LEFT  03/11/2020   IR ANGIO/SPINAL LEFT  03/11/2020   IR ANGIO/SPINAL LEFT  03/11/2020   IR ANGIO/SPINAL LEFT  03/11/2020   IR ANGIO/SPINAL LEFT  03/11/2020   IR ANGIO/SPINAL RIGHT  03/11/2020   IR ANGIO/SPINAL RIGHT  03/11/2020   IR ANGIO/SPINAL RIGHT  03/11/2020   IR ANGIO/SPINAL RIGHT  03/11/2020   IR ANGIO/SPINAL RIGHT  03/11/2020   IR ANGIO/SPINAL RIGHT  03/11/2020   IR ANGIOGRAM SELECTIVE EACH ADDITIONAL VESSEL  03/11/2020   IR ANGIOGRAM SELECTIVE EACH ADDITIONAL VESSEL  03/11/2020   IR US GUIDE VASC ACCESS RIGHT  03/11/2020   RADIOLOGY WITH ANESTHESIA N/A 03/11/2020   Procedure: IR WITH ANESTHESIA;  Surgeon: Radiologist, Medication, MD;  Location: [facility];  Service: Radiology;  Laterality: N/A;    There were no vitals filed for this visit.   Subjective Assessment - 12/08/20 1032     Subjective "My core still feels real weak, I am looking for something to hold on when I am stanidng and talking to people.  My left leg seems like it moves slower than I want"                                          PT Short Term Goals - 08/29/20 1639       PT SHORT TERM GOAL #1   Title The  patient will be able to initiate a basic HEP for core and LE strengthening (especially) right LE to facilitate return to ADLs    Status Achieved      PT SHORT TERM GOAL #2   Title The patient will report a 30% improvement in back and right LE pain particularly at the end of the day and with household chores including doing the dishwasher    Status Achieved      PT SHORT TERM GOAL #3   Title FOTO functional score improved from 33% to 43% indicating improved function with less pain    Status Achieved      PT SHORT TERM GOAL #4   Title The patient will be able to walk 8-10 minutes unassisted    Status Achieved      PT SHORT TERM GOAL #5   Title The patient will have improved spinal flexion to 60 degrees, extension to 20 degrees and bil sidebending to 20 degrees    Status Partially Met  PT Long Term Goals - 08/29/20 1639       PT LONG TERM GOAL #1   Title The patient will be independent with safe self progression of HEP( land and water based ex)    Time 16    Period Weeks    Status On-going    Target Date 12/19/20      PT LONG TERM GOAL #2   Title The patient will report a 50% improvement in back and right LE symptoms at the end of the day and with household chores    Time 16    Period Weeks    Status Revised      PT LONG TERM GOAL #3   Title The patient will have improved right LE motor control and core strength grossly 4 to 4+/5 needed for sit to stand, reaching, carrying light bags    Time 16    Period Weeks    Status Revised      PT LONG TERM GOAL #4   Title The patient will demonstrate improved activity tolerance for  walking 20 minutes or 1.5 miles at a faster pace    Time 16    Period Weeks    Status Revised      PT LONG TERM GOAL #5   Title FOTO functional outcome score improved to 57% indicating improved function    Time 16    Period Weeks    Status Revised            Pt seen for aquatic therapy today.  Treatment took place in water  3.25-4.8 ft in depth at the [facility] pool. Temp of water was 91.  Pt entered/exited the pool via stairs step through pattern cga with bilat rail.   Warm up: water walking forward, backward and sideways with increased step length, speed and positioning of ue.   Seated: stretching of gastroc , hamstring and LB. Knee flex/ext and add /abd with foam cuffs x 10 reps.     Suspended Core strengthening: -Prone to Supine Transition with Foam Dumbells : broken into segments:      -using 2 foam hand buoys standing to sup<>standing x 5 holding position x 10 seconds      -Knees to chest x 10 holding above position vc for elevated hips      -Standing<>prone x 5 holding position x 5 seconds      -prone to supine transition x 5 Pt requiring vc,  and demonstration for proper execution.  Multiple tries for proper completion   Side to Side Pendulum Swing with Foam Dumbbells 2 x 5   Less fatigue after today session.     Pt requires buoyancy for support and to offload joints with strengthening exercises. Viscosity of the water is needed for resistance of strengthening; water current perturbations provides challenge to standing balance unsupported, requiring increased core activation.         Plan - 12/08/20 1034     Clinical Impression Statement Pt reports tingling in thighs with gentle kicking as a warm up in pool after 2-3 mins.  Also reports increased neurological symptoms reaching to mid thigh. Advancing core strengthening exercises to improve balance/core control. She need multiple recovery periods thoughout.  She will need to cancel next on land visit due to being OOT but will call and reschedule for recertification evaluation.    Examination-Activity Limitations Locomotion Level;Bed Mobility;Reach Overhead;Bend;Caring for Others;Carry;Sleep;Lift;Stand;Hygiene/Grooming;Squat    Stability/Clinical Decision Making Evolving/Moderate complexity    Clinical Decision Making Moderate  Rehab  Potential Good    PT Frequency Biweekly    PT Treatment/Interventions ADLs/Self Care Home Management;Aquatic Therapy;Cryotherapy;Electrical Stimulation;Ultrasound;Moist Heat;Iontophoresis 5m/ml Dexamethasone;Functional mobility training;Therapeutic activities;Therapeutic exercise;Neuromuscular re-education;Manual techniques;Patient/family education;Taping;Dry needling    PT Next Visit Plan aquatic ex and land based ex spaced out every 2-3 weeks with awareness of 30 visit insurance limit             Patient will benefit from skilled therapeutic intervention in order to improve the following deficits and impairments:  Decreased range of motion, Difficulty walking, Increased fascial restricitons, Increased muscle spasms, Decreased activity tolerance, Impaired perceived functional ability, Pain, Decreased strength  Visit Diagnosis: Acute right-sided low back pain with right-sided sciatica  Muscle spasm of back  Muscle weakness (generalized)  Difficulty in walking, not elsewhere classified     Problem List Patient Active Problem List   Diagnosis Date Noted   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 03/09/2020   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

## 2020-12-15 ENCOUNTER — Encounter: Payer: 59 | Admitting: Physical Therapy

## 2020-12-28 ENCOUNTER — Other Ambulatory Visit: Payer: Self-pay | Admitting: Obstetrics and Gynecology

## 2020-12-28 DIAGNOSIS — Z803 Family history of malignant neoplasm of breast: Secondary | ICD-10-CM

## 2021-01-16 ENCOUNTER — Other Ambulatory Visit: Payer: Self-pay

## 2021-01-16 ENCOUNTER — Ambulatory Visit: Payer: 59 | Attending: Neurosurgery | Admitting: Physical Therapy

## 2021-01-16 DIAGNOSIS — R262 Difficulty in walking, not elsewhere classified: Secondary | ICD-10-CM

## 2021-01-16 DIAGNOSIS — M62830 Muscle spasm of back: Secondary | ICD-10-CM | POA: Diagnosis present

## 2021-01-16 DIAGNOSIS — M6281 Muscle weakness (generalized): Secondary | ICD-10-CM | POA: Diagnosis present

## 2021-01-16 DIAGNOSIS — M5441 Lumbago with sciatica, right side: Secondary | ICD-10-CM

## 2021-01-16 NOTE — Therapy (Signed)
[facility], [place], [address] Phone: [phone]   Fax:  [phone]  Physical Therapy Treatment/Recertification   Patient Details  Name: Taylor Sharp MRN: [id number] Date of Birth: 06/25/1960 Referring Provider (PT): Dr. Nikki Dom   Encounter Date: 01/16/2021   PT End of Session - 01/16/21 1723     Visit Number 28    Number of Visits 30    Date for PT Re-Evaluation 03/10/21    Authorization Type [employer] 30 visit limit    PT Start Time 1100    PT Stop Time 1130    PT Time Calculation (min) 30 min    Activity Tolerance Patient limited by pain             Past Medical History:  Diagnosis Date   Asthma     Past Surgical History:  Procedure Laterality Date   ABLATION     IR ANGIO/SPINAL LEFT  03/11/2020   IR ANGIO/SPINAL LEFT  03/11/2020   IR ANGIO/SPINAL LEFT  03/11/2020   IR ANGIO/SPINAL LEFT  03/11/2020   IR ANGIO/SPINAL LEFT  03/11/2020   IR ANGIO/SPINAL LEFT  03/11/2020   IR ANGIO/SPINAL LEFT  03/11/2020   IR ANGIO/SPINAL RIGHT  03/11/2020   IR ANGIO/SPINAL RIGHT  03/11/2020   IR ANGIO/SPINAL RIGHT  03/11/2020   IR ANGIO/SPINAL RIGHT  03/11/2020   IR ANGIO/SPINAL RIGHT  03/11/2020   IR ANGIO/SPINAL RIGHT  03/11/2020   IR ANGIOGRAM SELECTIVE EACH ADDITIONAL VESSEL  03/11/2020   IR ANGIOGRAM SELECTIVE EACH ADDITIONAL VESSEL  03/11/2020   IR US GUIDE VASC ACCESS RIGHT  03/11/2020   RADIOLOGY WITH ANESTHESIA N/A 03/11/2020   Procedure: IR WITH ANESTHESIA;  Surgeon: Radiologist, Medication, MD;  Location: [facility];  Service: Radiology;  Laterality: N/A;    There were no vitals filed for this visit.   Subjective Assessment - 01/16/21 1104     Subjective I've been doing too much.  I had an MRI T4 area this month.  They don't think it's actively bleeding.  They were looking for a cavernoma.  I'll have annual follow ups for imaging.  Left lower back still bothers me.  Sees tumor doctor later this month.  Legs  bother me a lot.  Both thighs.  Nights bother me a lot.  Going to pain management but not until February.  Could only sit for half of the show at [facility].    Pertinent History Gabapentin and Robaxin;  no current restrictions;  typically hypermobile; left hip bursitis; osteopenia    How long can you walk comfortably? 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 Yes    Pain Score 8     Pain Location Back    Pain Type Chronic pain    Pain Radiating Towards right > left                [facility] PT Assessment - 01/16/21 0001       Assessment   Medical Diagnosis intractable low back pain    Referring Provider (PT) Dr. Nikki Dom    Onset Date/Surgical Date 03/03/20    Next MD Visit 6 months  Prior Therapy for shoulder      Observation/Other Assessments   Focus on Therapeutic Outcomes (FOTO)  44%   taken out of system     AROM   Lumbar Flexion 45    Lumbar Extension 10    Lumbar - Right Side Bend 25    Lumbar - Left Side Bend 25      Strength   Overall Strength Comments extensive strength testing not done secondary to high pain levels and symptom sensitivity                           [facility] Adult PT Treatment/Exercise - 01/16/21 0001       Self-Care   Other Self-Care Comments  discussion on walking stability and options including walking poles      Lumbar Exercises: Seated   Other Seated Lumbar Exercises discussion on ex options and determined aquatic is most appropriate at this point                       PT Short Term Goals - 01/16/21 1732       PT SHORT TERM GOAL #1   Title The patient will be able to initiate a basic HEP for core and LE strengthening (especially) right LE to  facilitate return to ADLs    Status Achieved      PT SHORT TERM GOAL #2   Title The patient will report a 30% improvement in back and right LE pain particularly at the end of the day and with household chores including doing the dishwasher    Status Achieved      PT SHORT TERM GOAL #3   Title FOTO functional score improved from 33% to 43% indicating improved function with less pain    Status Achieved      PT SHORT TERM GOAL #4   Title The patient will be able to walk 8-10 minutes unassisted    Status Achieved      PT SHORT TERM GOAL #5   Title The patient will have improved spinal flexion to 60 degrees, extension to 20 degrees and bil sidebending to 20 degrees    Status Partially Met               PT Long Term Goals - 01/16/21 1732       PT LONG TERM GOAL #1   Title The patient will be independent with safe self progression of HEP( land and water based ex)    Time 8    Period Weeks    Status On-going    Target Date 03/10/21      PT LONG TERM GOAL #2   Title The patient will report a 50% improvement in back and right LE symptoms at the end of the day and with household chores    Time 8    Period Weeks    Status Deferred      PT LONG TERM GOAL #3   Title The patient will have improved right LE motor control and core strength grossly 4 to 4+/5 needed for sit to stand, reaching, carrying light bags    Time 8    Period Weeks    Status On-going      PT LONG TERM GOAL #4   Title The patient will demonstrate improved activity tolerance for  walking 20 minutes or 1.5 miles at a faster pace    Time 8    Status  Deferred      PT LONG TERM GOAL #5   Title FOTO functional outcome score improved to 57% indicating improved function    Baseline removed from [place] Deferred                   Plan - 01/16/21 1724     Clinical Impression Statement The patient returns for reassessment.  She has had some repeat imaging and follow ups with her doctors.  She has  been referred to Pain Management but her visit is not until February.   She reports continued high levels of pain but she has been able to increase her function level.  She is attending family events for her daughter's Senior year of [employer] and helping to plan another daughter's wedding.  She is able to walk about a mile with family or friends but notew she is wobbly at times.  We discussed options including walking poles for stability.  Her most success with exercise has been in the aquatic environment whereas land based ex was nearly intolerable.   Although minimal progress with goals has been made, she would have a decline in functional status without follow up PT.   Recommend 2 more visits of aquatic PT to finalize an independent water ex program.    Personal Factors and Comorbidities Comorbidity 1;Profession;Comorbidity 2;Comorbidity 3+;Past/Current Experience    Comorbidities osteopenia; hx of left hip bursitis; anxiety    Examination-Activity Limitations Locomotion Level;Bed Mobility;Reach Overhead;Bend;Caring for Others;Carry;Sleep;Lift;Stand;Hygiene/Grooming;Squat    Rehab Potential Good    PT Frequency 1x / week    PT Duration 8 weeks    PT Treatment/Interventions ADLs/Self Care Home Management;Aquatic Therapy;Cryotherapy;Electrical Stimulation;Ultrasound;Moist Heat;Iontophoresis 66m/ml Dexamethasone;Functional mobility training;Therapeutic activities;Therapeutic exercise;Neuromuscular re-education;Manual techniques;Patient/family education;Taping;Dry needling    PT Next Visit Plan 2 more visits of aquatic PT then discharge from PT             Patient will benefit from skilled therapeutic intervention in order to improve the following deficits and impairments:  Decreased range of motion, Difficulty walking, Increased fascial restricitons, Increased muscle spasms, Decreased activity tolerance, Impaired perceived functional ability, Pain, Decreased strength  Visit Diagnosis: Acute  right-sided low back pain with right-sided sciatica - Plan: PT plan of care cert/re-cert  Muscle spasm of back - Plan: PT plan of care cert/re-cert  Muscle weakness (generalized)  Difficulty in walking, not elsewhere classified - Plan: PT plan of care cert/re-cert     Problem List Patient Active Problem List   Diagnosis Date Noted   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 03/09/2020   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

## 2021-02-09 ENCOUNTER — Encounter (HOSPITAL_BASED_OUTPATIENT_CLINIC_OR_DEPARTMENT_OTHER): Payer: Self-pay | Admitting: Physical Therapy

## 2021-02-09 ENCOUNTER — Ambulatory Visit (HOSPITAL_BASED_OUTPATIENT_CLINIC_OR_DEPARTMENT_OTHER): Payer: 59 | Attending: Neurosurgery | Admitting: Physical Therapy

## 2021-02-09 ENCOUNTER — Other Ambulatory Visit: Payer: Self-pay

## 2021-02-09 DIAGNOSIS — M62830 Muscle spasm of back: Secondary | ICD-10-CM | POA: Diagnosis present

## 2021-02-09 DIAGNOSIS — M6281 Muscle weakness (generalized): Secondary | ICD-10-CM | POA: Diagnosis present

## 2021-02-09 DIAGNOSIS — R262 Difficulty in walking, not elsewhere classified: Secondary | ICD-10-CM | POA: Diagnosis present

## 2021-02-09 DIAGNOSIS — M5441 Lumbago with sciatica, right side: Secondary | ICD-10-CM | POA: Insufficient documentation

## 2021-02-09 NOTE — Therapy (Signed)
[facility] [address] [place], [place], [address] Phone: [phone]   Fax:  [phone]  Physical Therapy Treatment  Patient Details  Name: Taylor Sharp MRN: [id number] Date of Birth: November 03, 1960 Referring Provider (PT): Dr. Nikki Dom   Encounter Date: 02/09/2021   PT End of Session - 02/09/21 1019     Visit Number 29    Number of Visits 30    Date for PT Re-Evaluation 03/10/21    Authorization Type [employer] 30 visit limit    PT Start Time 0945    PT Stop Time 1040    PT Time Calculation (min) 55 min    Equipment Utilized During Treatment Other (comment)    Activity Tolerance Patient limited by pain;Other (comment)   Became dizzy with headache.   Behavior During Therapy [facility] for tasks assessed/performed             Past Medical History:  Diagnosis Date   Asthma     Past Surgical History:  Procedure Laterality Date   ABLATION     IR ANGIO/SPINAL LEFT  03/11/2020   IR ANGIO/SPINAL LEFT  03/11/2020   IR ANGIO/SPINAL LEFT  03/11/2020   IR ANGIO/SPINAL LEFT  03/11/2020   IR ANGIO/SPINAL LEFT  03/11/2020   IR ANGIO/SPINAL LEFT  03/11/2020   IR ANGIO/SPINAL LEFT  03/11/2020   IR ANGIO/SPINAL RIGHT  03/11/2020   IR ANGIO/SPINAL RIGHT  03/11/2020   IR ANGIO/SPINAL RIGHT  03/11/2020   IR ANGIO/SPINAL RIGHT  03/11/2020   IR ANGIO/SPINAL RIGHT  03/11/2020   IR ANGIO/SPINAL RIGHT  03/11/2020   IR ANGIOGRAM SELECTIVE EACH ADDITIONAL VESSEL  03/11/2020   IR ANGIOGRAM SELECTIVE EACH ADDITIONAL VESSEL  03/11/2020   IR US GUIDE VASC ACCESS RIGHT  03/11/2020   RADIOLOGY WITH ANESTHESIA N/A 03/11/2020   Procedure: IR WITH ANESTHESIA;  Surgeon: Radiologist, Medication, MD;  Location: [facility];  Service: Radiology;  Laterality: N/A;    There were no vitals filed for this visit.   Subjective Assessment - 02/09/21 1346     Subjective Pt reports getting along pretty well.  Pain now in bilateral LE.  She will be getting injections into her spine later  this month to improve pain control.  Good days/bad days.  Busy with engaged daughter and dtr in last year of high school    Currently in Pain? Yes    Pain Score 8     Pain Orientation Right;Left    Pain Descriptors / Indicators Aching;Burning;Tingling    Pain Type Chronic pain    Pain Radiating Towards right and left    Pain Onset More than a month ago                                          PT Short Term Goals - 01/16/21 1732       PT SHORT TERM GOAL #1   Title The patient will be able to initiate a basic HEP for core and LE strengthening (especially) right LE to facilitate return to ADLs    Status Achieved      PT SHORT TERM GOAL #2   Title The patient will report a 30% improvement in back and right LE pain particularly at the end of the day and with household chores including doing the dishwasher    Status Achieved      PT SHORT TERM GOAL #3  Title FOTO functional score improved from 33% to 43% indicating improved function with less pain    Status Achieved      PT SHORT TERM GOAL #4   Title The patient will be able to walk 8-10 minutes unassisted    Status Achieved      PT SHORT TERM GOAL #5   Title The patient will have improved spinal flexion to 60 degrees, extension to 20 degrees and bil sidebending to 20 degrees    Status Partially Met               PT Long Term Goals - 01/16/21 1732       PT LONG TERM GOAL #1   Title The patient will be independent with safe self progression of HEP( land and water based ex)    Time 8    Period Weeks    Status On-going    Target Date 03/10/21      PT LONG TERM GOAL #2   Title The patient will report a 50% improvement in back and right LE symptoms at the end of the day and with household chores    Time 8    Period Weeks    Status Deferred      PT LONG TERM GOAL #3   Title The patient will have improved right LE motor control and core strength grossly 4 to 4+/5 needed for sit to stand,  reaching, carrying light bags    Time 8    Period Weeks    Status On-going      PT LONG TERM GOAL #4   Title The patient will demonstrate improved activity tolerance for  walking 20 minutes or 1.5 miles at a faster pace    Time 8    Status Deferred      PT LONG TERM GOAL #5   Title FOTO functional outcome score improved to 57% indicating improved function    Baseline removed from FOTO    Status Deferred            Pt seen for aquatic therapy today.  Treatment took place in water 3.25-4.8 ft in depth at the [facility] pool. Temp of water was 91.  Pt entered/exited the pool via stairs step through pattern cga with bilat rail.   Warm up: water walking forward, backward and sideways with increased step length, speed and positioning of ue.   Seated: stretching of gastroc , hamstring and LB.  Cycling x 5 minutes slow pace  Suspened by noodle -flutter kicking 4 widths -frog kick prone x 4 widths -frog kick supine x 4 widths  Standing Df/pf  Marching Add/abd   -pt requiring multiple recovery periods. Throughout.  C/O not feeling well. Pt requires buoyancy for support and to offload joints with strengthening exercises. Viscosity of the water is needed for resistance of strengthening; water current perturbations provides challenge to standing balance unsupported, requiring increased core activation.        Plan - 02/09/21 1349     Clinical Impression Statement Pt with difficulty tolerating therapy today due to initial increase in pain (not unusual) in LB and legs but then also headache which cuased dizziness. Pt is assisted out of the pool. O2 sats and pulse monitored 60bpm and 99% O2 sats.  Was unable to get a BP using automatic cuff.  Pt is encourage to increase fluid intake and eat breakfast then call MD if sx persist.  Exercises in pool focus on gentle stretching and exercise.  Pt does not tolerate anyone position or exercise for long period of time.  WIth each  exercise pain increased.  She has 1 more visit schedled and plan is dc.    Stability/Clinical Decision Making Evolving/Moderate complexity    Clinical Decision Making Moderate    Rehab Potential Good    PT Frequency 1x / week    PT Treatment/Interventions ADLs/Self Care Home Management;Aquatic Therapy;Cryotherapy;Electrical Stimulation;Ultrasound;Moist Heat;Iontophoresis 2m/ml Dexamethasone;Functional mobility training;Therapeutic activities;Therapeutic exercise;Neuromuscular re-education;Manual techniques;Patient/family education;Taping;Dry needling    PT Next Visit Plan DC             Patient will benefit from skilled therapeutic intervention in order to improve the following deficits and impairments:  Decreased range of motion, Difficulty walking, Increased fascial restricitons, Increased muscle spasms, Decreased activity tolerance, Impaired perceived functional ability, Pain, Decreased strength  Visit Diagnosis: Acute right-sided low back pain with right-sided sciatica  Muscle spasm of back  Muscle weakness (generalized)  Difficulty in walking, not elsewhere classified     Problem List Patient Active Problem List   Diagnosis Date Noted   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

## 2021-02-23 ENCOUNTER — Other Ambulatory Visit: Payer: Self-pay

## 2021-02-23 ENCOUNTER — Ambulatory Visit (HOSPITAL_BASED_OUTPATIENT_CLINIC_OR_DEPARTMENT_OTHER): Payer: 59 | Admitting: Physical Therapy

## 2021-02-23 ENCOUNTER — Encounter (HOSPITAL_BASED_OUTPATIENT_CLINIC_OR_DEPARTMENT_OTHER): Payer: Self-pay | Admitting: Physical Therapy

## 2021-02-23 DIAGNOSIS — M62830 Muscle spasm of back: Secondary | ICD-10-CM

## 2021-02-23 DIAGNOSIS — R262 Difficulty in walking, not elsewhere classified: Secondary | ICD-10-CM

## 2021-02-23 DIAGNOSIS — M5441 Lumbago with sciatica, right side: Secondary | ICD-10-CM

## 2021-02-23 DIAGNOSIS — M6281 Muscle weakness (generalized): Secondary | ICD-10-CM

## 2021-02-23 NOTE — Therapy (Signed)
[facility] [address] [place], [place], [address] Phone: [phone]   Fax:  [phone]  Physical Therapy Treatment  Patient Details  Name: Taylor Sharp MRN: [id number] Date of Birth: Jun 25, 1960 Referring Provider (PT): Dr. Nikki Dom   Encounter Date: 02/23/2021   PT End of Session - 02/23/21 1058     Visit Number 30    Number of Visits 30    Date for PT Re-Evaluation 03/10/21    Authorization Type [employer] 30 visit limit    PT Start Time 0946    PT Stop Time 1030    PT Time Calculation (min) 44 min    Equipment Utilized During Treatment Other (comment)    Activity Tolerance Patient limited by pain;Other (comment)   Became dizzy with headache.   Behavior During Therapy [facility] for tasks assessed/performed             Past Medical History:  Diagnosis Date   Asthma     Past Surgical History:  Procedure Laterality Date   ABLATION     IR ANGIO/SPINAL LEFT  03/11/2020   IR ANGIO/SPINAL LEFT  03/11/2020   IR ANGIO/SPINAL LEFT  03/11/2020   IR ANGIO/SPINAL LEFT  03/11/2020   IR ANGIO/SPINAL LEFT  03/11/2020   IR ANGIO/SPINAL LEFT  03/11/2020   IR ANGIO/SPINAL LEFT  03/11/2020   IR ANGIO/SPINAL RIGHT  03/11/2020   IR ANGIO/SPINAL RIGHT  03/11/2020   IR ANGIO/SPINAL RIGHT  03/11/2020   IR ANGIO/SPINAL RIGHT  03/11/2020   IR ANGIO/SPINAL RIGHT  03/11/2020   IR ANGIO/SPINAL RIGHT  03/11/2020   IR ANGIOGRAM SELECTIVE EACH ADDITIONAL VESSEL  03/11/2020   IR ANGIOGRAM SELECTIVE EACH ADDITIONAL VESSEL  03/11/2020   IR US GUIDE VASC ACCESS RIGHT  03/11/2020   RADIOLOGY WITH ANESTHESIA N/A 03/11/2020   Procedure: IR WITH ANESTHESIA;  Surgeon: Radiologist, Medication, MD;  Location: [facility];  Service: Radiology;  Laterality: N/A;    There were no vitals filed for this visit.   Subjective Assessment - 02/23/21 1238     Subjective Pt upset today.  Wants to get as strong as able but is having some changes with pain limiting her ability. "I have  learned how to modify my activity to avoid exhausting myself"    Currently in Pain? Yes    Pain Score 8     Pain Location Back    Pain Orientation Right;Left    Pain Descriptors / Indicators Burning;Aching;Tingling    Pain Type Chronic pain    Pain Onset More than a month ago                                          PT Short Term Goals - 01/16/21 1732       PT SHORT TERM GOAL #1   Title The patient will be able to initiate a basic HEP for core and LE strengthening (especially) right LE to facilitate return to ADLs    Status Achieved      PT SHORT TERM GOAL #2   Title The patient will report a 30% improvement in back and right LE pain particularly at the end of the day and with household chores including doing the dishwasher    Status Achieved      PT SHORT TERM GOAL #3   Title FOTO functional score improved from 33% to 43% indicating improved function with  less pain    Status Achieved      PT SHORT TERM GOAL #4   Title The patient will be able to walk 8-10 minutes unassisted    Status Achieved      PT SHORT TERM GOAL #5   Title The patient will have improved spinal flexion to 60 degrees, extension to 20 degrees and bil sidebending to 20 degrees    Status Partially Met               PT Long Term Goals - 01/16/21 1732       PT LONG TERM GOAL #1   Title The patient will be independent with safe self progression of HEP( land and water based ex)    Time 8    Period Weeks    Status On-going    Target Date 03/10/21      PT LONG TERM GOAL #2   Title The patient will report a 50% improvement in back and right LE symptoms at the end of the day and with household chores    Time 8    Period Weeks    Status Deferred      PT LONG TERM GOAL #3   Title The patient will have improved right LE motor control and core strength grossly 4 to 4+/5 needed for sit to stand, reaching, carrying light bags    Time 8    Period Weeks    Status On-going       PT LONG TERM GOAL #4   Title The patient will demonstrate improved activity tolerance for  walking 20 minutes or 1.5 miles at a faster pace    Time 8    Status Deferred      PT LONG TERM GOAL #5   Title FOTO functional outcome score improved to 57% indicating improved function    Baseline removed from FOTO    Status Deferred             Pt seen for aquatic therapy today.  Treatment took place in water 3.25-4.8 ft in depth at the [facility] pool. Temp of water was 91.  Pt entered/exited the pool via stairs step through pattern cga with bilat rail.   Warm up: water walking forward, backward and sideways with increased step length, speed and positioning of ue.   Core strengtheing exercises partly in review for understanding and indep with HEP Kick board push downs; lat core rotations 2x20  Amb fwd, retro and sideways with 1 foam hand buoy submerged x 2 widths each. Side to side pendulums Forward to back pendulums    Pt requires buoyancy for support and to offload joints with strengthening exercises. Viscosity of the water is needed for resistance of strengthening; water current perturbations provides challenge to standing balance unsupported, requiring increased core activation.         Plan - 02/23/21 1242     Clinical Impression Statement Continued difficulty tolerating therapy. Complaints of back pain and leg pain. Last visit scheduled at pool.  Pt instructed on HEP, core exercises to maintain strength.  She is planning on getting membership at [facility] to use pool. Pt has reached her max potential with aquatic theapy. She is indep with HEP and is safe and indep in this setting.  She demonstrates good knowledge of her dx and management therof. She does have a new order for PT.  Unlikely on land follow up will be beneficial. Will DC at this point. Pt deciding on new evaluation  going forward.    Stability/Clinical Decision Making Evolving/Moderate complexity     Clinical Decision Making Moderate    Rehab Potential Good    PT Frequency 1x / week    PT Duration 8 weeks    PT Treatment/Interventions ADLs/Self Care Home Management;Aquatic Therapy;Cryotherapy;Electrical Stimulation;Ultrasound;Moist Heat;Iontophoresis 45m/ml Dexamethasone;Functional mobility training;Therapeutic activities;Therapeutic exercise;Neuromuscular re-education;Manual techniques;Patient/family education;Taping;Dry needling             Patient will benefit from skilled therapeutic intervention in order to improve the following deficits and impairments:  Decreased range of motion, Difficulty walking, Increased fascial restricitons, Increased muscle spasms, Decreased activity tolerance, Impaired perceived functional ability, Pain, Decreased strength  Visit Diagnosis: Acute right-sided low back pain with right-sided sciatica  Muscle spasm of back  Muscle weakness (generalized)  Difficulty in walking, not elsewhere classified     Problem List Patient Active Problem List   Diagnosis Date Noted   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 03/09/2020   [place] 03/09/2020   Intractable low back pain 03/09/2020   Left hip pain 12/10/2018   Osteopenia 05/11/2018   Bursitis of hip 02/18/2017   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 Patient goals were partially met. Patient is being discharged due to maximized rehab potential.    MStanton Kidney(Tharon Aquas Nellene Courtois MPT 02/23/2021, 1:04  PM  [facility] [place] [address]Phone: [phone]  Fax:  [phone] Name: Taylor StreaterMRN: [id number] of Birth: 1Dec 04, 1962

## 2021-03-23 DIAGNOSIS — E538 Deficiency of other specified B group vitamins: Secondary | ICD-10-CM | POA: Diagnosis not present

## 2021-03-28 ENCOUNTER — Ambulatory Visit
Admission: RE | Admit: 2021-03-28 | Discharge: 2021-03-28 | Disposition: A | Discharge status: Home / Self Care | Payer: BC Managed Care – PPO | Source: Ambulatory Visit | Attending: Obstetrics and Gynecology | Admitting: Obstetrics and Gynecology

## 2021-03-28 ENCOUNTER — Other Ambulatory Visit: Payer: Self-pay

## 2021-03-28 DIAGNOSIS — Z803 Family history of malignant neoplasm of breast: Secondary | ICD-10-CM

## 2021-03-28 DIAGNOSIS — Z853 Personal history of malignant neoplasm of breast: Secondary | ICD-10-CM | POA: Diagnosis not present

## 2021-03-28 MED ORDER — GADOBUTROL 1 MMOL/ML IV SOLN
6.0000 mL | Freq: Once | INTRAVENOUS | Status: AC | PRN
  Administered 2021-03-28: 6 mL via INTRAVENOUS

## 2021-04-10 ENCOUNTER — Encounter: Payer: Self-pay | Admitting: Physical Therapy

## 2021-04-10 ENCOUNTER — Ambulatory Visit: Payer: BC Managed Care – PPO | Attending: Neurosurgery | Admitting: Physical Therapy

## 2021-04-10 ENCOUNTER — Other Ambulatory Visit: Payer: Self-pay

## 2021-04-10 DIAGNOSIS — G8929 Other chronic pain: Secondary | ICD-10-CM | POA: Diagnosis not present

## 2021-04-10 DIAGNOSIS — R262 Difficulty in walking, not elsewhere classified: Secondary | ICD-10-CM | POA: Diagnosis not present

## 2021-04-10 DIAGNOSIS — M6281 Muscle weakness (generalized): Secondary | ICD-10-CM | POA: Insufficient documentation

## 2021-04-10 DIAGNOSIS — M545 Low back pain, unspecified: Secondary | ICD-10-CM | POA: Insufficient documentation

## 2021-04-10 NOTE — Therapy (Signed)
[facility] @ [address] [place], [place], [address] Phone: [phone]   Fax:  [phone]  Physical Therapy Evaluation  Patient Details  Name: Taylor Sharp MRN: [id number] Date of Birth: 09-Dec-1960 Referring Provider (PT):  [place]   Encounter Date: 04/10/2021   PT End of Session - 04/10/21 1847     Visit Number 1    Date for PT Re-Evaluation 07/03/21    Authorization Type BCBS    PT Start Time 0931    PT Stop Time 1014    PT Time Calculation (min) 43 min    Activity Tolerance Patient limited by pain             Past Medical History:  Diagnosis Date   Asthma     Past Surgical History:  Procedure Laterality Date   ABLATION     IR ANGIO/SPINAL LEFT  03/11/2020   IR ANGIO/SPINAL LEFT  03/11/2020   IR ANGIO/SPINAL LEFT  03/11/2020   IR ANGIO/SPINAL LEFT  03/11/2020   IR ANGIO/SPINAL LEFT  03/11/2020   IR ANGIO/SPINAL LEFT  03/11/2020   IR ANGIO/SPINAL LEFT  03/11/2020   IR ANGIO/SPINAL RIGHT  03/11/2020   IR ANGIO/SPINAL RIGHT  03/11/2020   IR ANGIO/SPINAL RIGHT  03/11/2020   IR ANGIO/SPINAL RIGHT  03/11/2020   IR ANGIO/SPINAL RIGHT  03/11/2020   IR ANGIO/SPINAL RIGHT  03/11/2020   IR ANGIOGRAM SELECTIVE EACH ADDITIONAL VESSEL  03/11/2020   IR ANGIOGRAM SELECTIVE EACH ADDITIONAL VESSEL  03/11/2020   IR US GUIDE VASC ACCESS RIGHT  03/11/2020   RADIOLOGY WITH ANESTHESIA N/A 03/11/2020   Procedure: IR WITH ANESTHESIA;  Surgeon: Radiologist, Medication, MD;  Location: [facility];  Service: Radiology;  Laterality: N/A;    There were no vitals filed for this visit.    Subjective Assessment - 04/10/21 0932     Subjective I need to get ready for my daughter's wedding.  I need to reduce Gabapentin b/c of stomach upset.  Going to wait until after the wedding for injections.  Can't do hills.  At the dr's office right side hyporeflexic; decreased and delayed senstation;  I'm exhausted when I walk.    Pertinent History Gabapentin and Robaxin;   no current restrictions;  typically hypermobile; left hip bursitis; osteopenia    Limitations House hold activities;Walking;Lifting    How long can you walk comfortably? 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 Yes    Pain Score 5     Pain Location Back    Pain Orientation Right;Left    Pain Type Chronic pain    Pain Radiating Towards both legs, sometimess butt, quads, HS, calves get fired up    Aggravating Factors  walking; driving daughter to school; up and down steps; sitting    Pain Relieving Factors supine knees bent with arms underneath me                [facility] PT Assessment - 04/10/21 0001       Assessment   Medical Diagnosis intractable low back pain    Referring Provider (PT) Dr. Sharyon Cable    Onset Date/Surgical Date 03/03/20    Prior Therapy in 2022      Precautions   Precautions None  Restrictions   Weight Bearing Restrictions No      Balance Screen   Has the patient fallen in the past 6 months No    Has the patient had a decrease in activity level because of a fear of falling?  Yes   prefers to walk with someone's arm support for stability   Is the patient reluctant to leave their home because of a fear of falling?  No      Home Social worker Private residence    Living Arrangements Spouse/significant other;Children    [place] to enter    Home Layout Two level    Additional Comments has walking poles (feels too uncoordinated to use); has SPC but doesn't use      Prior Function   Level of Independence Independent with basic ADLs    Vocation Requirements unable to work at this time    Leisure spend time with family      Observation/Other Assessments   Focus on Therapeutic Outcomes (FOTO)  34%      Posture/Postural Control    Posture Comments pt unable to tolerate sitting for history portion preferring to lie down   visible LE atrophy particularly right gastroc and quads     AROM   Overall AROM Comments Standing lumbar movements not formally assessed secondary to pain intensity; able to active do ankle dorsiflex bil      Strength   Overall Strength Comments extensive strength testing not done secondary to high pain levels and symptom sensitivity;  left supine LE press out 3+/5;  supine UE manual pressures grossly 3+/5 although caused intense thoracolumbar pain/spasm in response;  lacks strength to do SLR on right or left    Right Shoulder Flexion 4-/5    Right Shoulder Extension 4-/5    Right Shoulder ABduction 4-/5    Left Shoulder Flexion 4-/5    Left Shoulder Extension 4-/5    Left Shoulder ABduction 4-/5    Right Hip Flexion 2-/5    Right Hip Extension 3-/5    Right Hip ABduction 2-/5    Right Hip ADduction 3-/5    Left Hip Flexion 2-/5    Left Hip Extension 3-/5    Left Hip ABduction 2-/5    Left Hip ADduction 3-/5    Left Ankle Plantar Flexion 3+/5    Lumbar Flexion 3+/5      Special Tests   Other special tests LE neural irritability with extended knee right > left      Transfers   Comments Leans trunk excessively forward and pushes hands on thighs with sit to stand      Ambulation/Gait   Gait Comments no assistive device currently used although pt feels a little unsteady and dizzy with sit to stand;  gait speed is slow                        Objective measurements completed on examination: See above findings.                  PT Short Term Goals - 04/10/21 1925       PT SHORT TERM GOAL #1   Title The patient will be able to initiate a basic HEP for core and LE strengthening (especially) right LE to facilitate return to ADLs    Time 6    Period Weeks    Status New    Target Date 05/22/21  PT SHORT TERM GOAL #2   Title The patient will be able to  tolerate 30 min of activity supine, seated and standing    Time 6    Period Weeks    Status New      PT SHORT TERM GOAL #3   Title The patient have improved FOTO score from 34% to 42%    Time 6    Period Weeks    Status New      PT SHORT TERM GOAL #4   Title The patient will be able to walk 10 minutes unassisted    Time 6    Period Weeks    Status New      PT SHORT TERM GOAL #5   Title .Marland KitchenMarland Kitchen               PT Long Term Goals - 04/10/21 1929       PT LONG TERM GOAL #1   Title The patient will be independent with safe self progression of HEP    Time 12    Period Weeks    Status New    Target Date 07/03/21      PT LONG TERM GOAL #2   Title The patient will tolerate 40 min of sitting and standing activity to prepare for her daughter's wedding    Time 12    Period Weeks    Status New      PT LONG TERM GOAL #3   Title The patient will have improved trunk, UE and LE strength to grossly 3/5 to 3+/5 needed for walking, standing longer periods of time and curb/step negotiation    Time 12    Period Weeks    Status New      PT LONG TERM GOAL #4   Title The patient will demonstrate improved activity tolerance for  walking 20 minutes    Time 12    Period Weeks    Status New      PT LONG TERM GOAL #5   Title FOTO functional outcome score improved to 52%indicating improved function    Time 12    Period Weeks    Status New                    Plan - 04/10/21 1902     Clinical Impression Statement The patient is well known to this facility and clinic for treatment last year following 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  She had a very low tolerance for activity and exercise secondary to intense pain.  She was most comfortable doing aquatic PT.  She is referred to PT at this time to help with strengthening and functional improvements needed for her daughter's upcoming wedding.  Currently, she reports she is  limited to short distances in her cul-de-sac.  With arm hold with her husband she can do about a mile but it is extremely fatiguing and she would need to rest for quite a while afterwards. She feels better with hugging knees to chest and typically lying down with her knees bent.  Pain is worsened with sitting, prolonged walking.  She has great difficulty rising from a squat.  She is unable to do a SLR on either side. Visible bil LE muscle atrophy particularly on the right in gastroc and quads.  Significant weakness and motor control of bil LEs grossly 2-/5 to 3-/5.  Trunk strength also 2-/5 to 3-/5.  With isometric  UE resistance in supine, intense back pain (thoracic pain) is produced.  She will needed graded exposure to exercise, low intensity and short duration secondary to very high pain levels.  She is seeing a pain doctor this week who can hopefully assist with pain modulation to help tolerance with therapy.    Personal Factors and Comorbidities Comorbidity 1;Profession;Comorbidity 2;Comorbidity 3+;Past/Current Experience;Time since onset of injury/illness/exacerbation    Comorbidities osteopenia; hx of left hip bursitis; anxiety; nerve/spinal cord involvement    Examination-Activity Limitations Locomotion Level;Bed Mobility;Reach Overhead;Bend;Caring for Others;Carry;Sleep;Lift;Stand;Hygiene/Grooming;Squat    Examination-Participation Restrictions Meal Prep;Cleaning;Occupation;Community Activity;Driving;Laundry;Medication Management    Stability/Clinical Decision Making Evolving/Moderate complexity    Rehab Potential Good    PT Frequency 2x / week    PT Duration 12 weeks    PT Treatment/Interventions ADLs/Self Care Home Management;Aquatic Therapy;Cryotherapy;Electrical Stimulation;Ultrasound;Moist Heat;Iontophoresis 4mg /ml Dexamethasone;Functional mobility training;Therapeutic activities;Therapeutic exercise;Neuromuscular re-education;Manual techniques;Patient/family education;Taping;Dry needling     PT Home Exercise Plan graded exposure to ex: supine, seated focus on UE movements low reps    Recommended Other Services she would benefit from pelvic floor referral to address urgency of bowel/bladder symptoms    Consulted and Agree with Plan of Care Patient             Patient will benefit from skilled therapeutic intervention in order to improve the following deficits and impairments:  Decreased range of motion, Difficulty walking, Increased fascial restricitons, Increased muscle spasms, Decreased activity tolerance, Impaired perceived functional ability, Pain, Decreased strength, Impaired UE functional use  Visit Diagnosis: Chronic bilateral low back pain, unspecified whether sciatica present - Plan: PT plan of care cert/re-cert  Muscle weakness (generalized) - Plan: PT plan of care cert/re-cert  Difficulty in walking, not elsewhere classified - Plan: PT plan of care cert/re-cert     Problem List Patient Active Problem List   Diagnosis Date Noted   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 03/09/2020   A-V fistula ([place]) 03/09/2020   Intractable low back pain 03/09/2020   Left hip pain 12/10/2018   Osteopenia 05/11/2018   Bursitis of hip 02/18/2017   Hypermobility of joint 02/18/2017   Intrinsic asthma 08/04/2014   GERD (gastroesophageal reflux disease) 08/04/2014   Upper airway cough syndrome 08/04/2014   Chest discomfort 05/25/2014   Ruben Im, PT 04/10/21 7:39 PM Phone: [phone] Fax: [phone]  Alvera Singh, PT 04/10/2021, 7:37 PM  [facility] @ [address] [place], [place], [address] Phone: [phone]   Fax:  [phone]  Name: Taylor Sharp MRN: [id number] Date of Birth: 23-Sep-1960

## 2021-04-13 DIAGNOSIS — R29898 Other symptoms and signs involving the musculoskeletal system: Secondary | ICD-10-CM | POA: Diagnosis not present

## 2021-04-13 DIAGNOSIS — G894 Chronic pain syndrome: Secondary | ICD-10-CM | POA: Diagnosis not present

## 2021-04-13 DIAGNOSIS — M5417 Radiculopathy, lumbosacral region: Secondary | ICD-10-CM | POA: Diagnosis not present

## 2021-04-13 DIAGNOSIS — M5459 Other low back pain: Secondary | ICD-10-CM | POA: Diagnosis not present

## 2021-04-20 ENCOUNTER — Other Ambulatory Visit: Payer: Self-pay

## 2021-04-20 ENCOUNTER — Ambulatory Visit: Payer: BC Managed Care – PPO | Attending: Neurosurgery | Admitting: Physical Therapy

## 2021-04-20 DIAGNOSIS — R262 Difficulty in walking, not elsewhere classified: Secondary | ICD-10-CM | POA: Insufficient documentation

## 2021-04-20 DIAGNOSIS — M545 Low back pain, unspecified: Secondary | ICD-10-CM | POA: Diagnosis not present

## 2021-04-20 DIAGNOSIS — R278 Other lack of coordination: Secondary | ICD-10-CM | POA: Diagnosis not present

## 2021-04-20 DIAGNOSIS — G8929 Other chronic pain: Secondary | ICD-10-CM | POA: Diagnosis not present

## 2021-04-20 DIAGNOSIS — M6281 Muscle weakness (generalized): Secondary | ICD-10-CM | POA: Diagnosis not present

## 2021-04-20 NOTE — Therapy (Signed)
[place], [place], [address] Phone: [phone]   Fax:  [phone]  Physical Therapy Treatment  Patient Details  Name: Taylor Sharp MRN: [id number] Date of Birth: 1960/10/02 Referring Provider (PT): Dr. Sharyon Cable   Encounter Date: 04/20/2021   PT End of Session - 04/20/21 1014     Visit Number 2    Number of Visits 30    Date for PT Re-Evaluation 07/03/21    Authorization Type BCBS    PT Start Time 0930    PT Stop Time 1001   low tolerance   PT Time Calculation (min) 31 min    Activity Tolerance Patient limited by pain             Past Medical History:  Diagnosis Date   Asthma     Past Surgical History:  Procedure Laterality Date   ABLATION     IR ANGIO/SPINAL LEFT  03/11/2020   IR ANGIO/SPINAL LEFT  03/11/2020   IR ANGIO/SPINAL LEFT  03/11/2020   IR ANGIO/SPINAL LEFT  03/11/2020   IR ANGIO/SPINAL LEFT  03/11/2020   IR ANGIO/SPINAL LEFT  03/11/2020   IR ANGIO/SPINAL LEFT  03/11/2020   IR ANGIO/SPINAL RIGHT  03/11/2020   IR ANGIO/SPINAL RIGHT  03/11/2020   IR ANGIO/SPINAL RIGHT  03/11/2020   IR ANGIO/SPINAL RIGHT  03/11/2020   IR ANGIO/SPINAL RIGHT  03/11/2020   IR ANGIO/SPINAL RIGHT  03/11/2020   IR ANGIOGRAM SELECTIVE EACH ADDITIONAL VESSEL  03/11/2020   IR ANGIOGRAM SELECTIVE EACH ADDITIONAL VESSEL  03/11/2020   IR US GUIDE VASC ACCESS RIGHT  03/11/2020   RADIOLOGY WITH ANESTHESIA N/A 03/11/2020   Procedure: IR WITH ANESTHESIA;  Surgeon: Radiologist, Medication, MD;  Location: [facility];  Service: Radiology;  Laterality: N/A;    There were no vitals filed for this visit.   Subjective Assessment - 04/20/21 0940     Subjective I have a new referral for pelvic floor therapy.  I went to aquatic ex class and was able to do 20 minutes.  My balance is not great.  I couldn't do [place].    Pertinent History Gabapentin and Robaxin;  no current restrictions;  typically hypermobile; left hip bursitis; osteopenia     How long can you walk comfortably? If my husband is with me 1-2 miles holding onto him;  in cul-de-sac only by myself    Patient Stated Goals get ready for daughter's wedding in June    Currently in Pain? Yes    Pain Score 5     Pain Location Back                               [facility] Adult PT Treatment/Exercise - 04/20/21 0001       Lumbar Exercises: Standing   Other Standing Lumbar Exercises yellow band biceps curls 2x5    Other Standing Lumbar Exercises yellow band small pull downs 5x; yellow band rows 5x      Lumbar Exercises: Seated   Other Seated Lumbar Exercises holding 3# with in both hands shoulder V, hip to hip and press forward 5x each      Lumbar Exercises: Supine   Other Supine Lumbar Exercises 2# on golf club overhead 5x; press 5x    Other Supine Lumbar Exercises holding golf club 2# in air with marching 5x 2      Lumbar Exercises: Sidelying   Other Sidelying Lumbar  Exercises single arm elevation 5x right/left                       PT Short Term Goals - 04/10/21 1925       PT SHORT TERM GOAL #1   Title The patient will be able to initiate a basic HEP for core and LE strengthening (especially) right LE to facilitate return to ADLs    Time 6    Period Weeks    Status New    Target Date 05/22/21      PT SHORT TERM GOAL #2   Title The patient will be able to tolerate 30 min of activity supine, seated and standing    Time 6    Period Weeks    Status New      PT SHORT TERM GOAL #3   Title The patient have improved FOTO score from 34% to 42%    Time 6    Period Weeks    Status New      PT SHORT TERM GOAL #4   Title The patient will be able to walk 10 minutes unassisted    Time 6    Period Weeks    Status New      PT SHORT TERM GOAL #5   Title .Marland KitchenMarland Kitchen               PT Long Term Goals - 04/10/21 1929       PT LONG TERM GOAL #1   Title The patient will be independent with safe self progression of HEP    Time 12     Period Weeks    Status New    Target Date 07/03/21      PT LONG TERM GOAL #2   Title The patient will tolerate 40 min of sitting and standing activity to prepare for her daughter's wedding    Time 12    Period Weeks    Status New      PT LONG TERM GOAL #3   Title The patient will have improved trunk, UE and LE strength to grossly 3/5 to 3+/5 needed for walking, standing longer periods of time and curb/step negotiation    Time 12    Period Weeks    Status New      PT LONG TERM GOAL #4   Title The patient will demonstrate improved activity tolerance for  walking 20 minutes    Time 12    Period Weeks    Status New      PT LONG TERM GOAL #5   Title FOTO functional outcome score improved to 52%indicating improved function    Time 12    Period Weeks    Status New                   Plan - 04/20/21 1212     Clinical Impression Statement Low level ther ex initiated using graded exposure with limited reps and changes of position.   Focused on upper body movements in standing and seated and both UE and LE in supine only (for trunk stability).  She reports leg muscles "buzzing" toward the end of session.  Limited ex time to 30 min secondary to symptom sensitivity.  Therapist very closely monitoring to ensure she does not "overdo it" which historically has set her back.    Personal Factors and Comorbidities Comorbidity 1;Profession;Comorbidity 2;Comorbidity 3+;Past/Current Experience;Time since onset of injury/illness/exacerbation    Comorbidities osteopenia; hx of left  hip bursitis; anxiety; nerve/spinal cord involvement    Examination-Activity Limitations Locomotion Level;Bed Mobility;Reach Overhead;Bend;Caring for Others;Carry;Sleep;Lift;Stand;Hygiene/Grooming;Squat    Examination-Participation Restrictions Meal Prep;Cleaning;Occupation;Community Activity;Driving;Laundry;Medication Management    Rehab Potential Good    PT Frequency 2x / week    PT Duration 12 weeks    PT  Treatment/Interventions ADLs/Self Care Home Management;Aquatic Therapy;Cryotherapy;Electrical Stimulation;Ultrasound;Moist Heat;Iontophoresis 4mg /ml Dexamethasone;Functional mobility training;Therapeutic activities;Therapeutic exercise;Neuromuscular re-education;Manual techniques;Patient/family education;Taping;Dry needling    PT Next Visit Plan graded exposure to ex: supine, seated focus on UE movements low reps; discuss with pelvic floor therapist             Patient will benefit from skilled therapeutic intervention in order to improve the following deficits and impairments:  Decreased range of motion, Difficulty walking, Increased fascial restricitons, Increased muscle spasms, Decreased activity tolerance, Impaired perceived functional ability, Pain, Decreased strength, Impaired UE functional use  Visit Diagnosis: Chronic bilateral low back pain, unspecified whether sciatica present  Muscle weakness (generalized)  Difficulty in walking, not elsewhere classified     Problem List Patient Active Problem List   Diagnosis Date Noted   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 03/09/2020   A-V fistula ([facility]) 03/09/2020   Intractable low back pain 03/09/2020   Left hip pain 12/10/2018   Osteopenia 05/11/2018   Bursitis of hip 02/18/2017   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

## 2021-04-26 ENCOUNTER — Other Ambulatory Visit: Payer: Self-pay

## 2021-04-26 ENCOUNTER — Ambulatory Visit: Payer: BC Managed Care – PPO | Admitting: Physical Therapy

## 2021-04-26 DIAGNOSIS — M545 Low back pain, unspecified: Secondary | ICD-10-CM

## 2021-04-26 DIAGNOSIS — G8929 Other chronic pain: Secondary | ICD-10-CM | POA: Diagnosis not present

## 2021-04-26 DIAGNOSIS — R262 Difficulty in walking, not elsewhere classified: Secondary | ICD-10-CM | POA: Diagnosis not present

## 2021-04-26 DIAGNOSIS — R278 Other lack of coordination: Secondary | ICD-10-CM | POA: Diagnosis not present

## 2021-04-26 DIAGNOSIS — M6281 Muscle weakness (generalized): Secondary | ICD-10-CM

## 2021-04-26 NOTE — Therapy (Signed)
[facility] @ [place], [place], [address] Phone: [phone]   Fax:  [phone]  Physical Therapy Treatment  Patient Details  Name: Taylor Sharp MRN: [id number] Date of Birth: January 28, 1961 Referring Provider (PT): Dr. Sharyon Cable   Encounter Date: 04/26/2021   PT End of Session - 04/26/21 1409     Visit Number 3    Number of Visits 30    Date for PT Re-Evaluation 07/03/21    Authorization Type BCBS    PT Start Time 7939    PT Stop Time 1425   very limited ex tolerance   PT Time Calculation (min) 28 min    Activity Tolerance Patient limited by pain             Past Medical History:  Diagnosis Date   Asthma     Past Surgical History:  Procedure Laterality Date   ABLATION     IR ANGIO/SPINAL LEFT  03/11/2020   IR ANGIO/SPINAL LEFT  03/11/2020   IR ANGIO/SPINAL LEFT  03/11/2020   IR ANGIO/SPINAL LEFT  03/11/2020   IR ANGIO/SPINAL LEFT  03/11/2020   IR ANGIO/SPINAL LEFT  03/11/2020   IR ANGIO/SPINAL LEFT  03/11/2020   IR ANGIO/SPINAL RIGHT  03/11/2020   IR ANGIO/SPINAL RIGHT  03/11/2020   IR ANGIO/SPINAL RIGHT  03/11/2020   IR ANGIO/SPINAL RIGHT  03/11/2020   IR ANGIO/SPINAL RIGHT  03/11/2020   IR ANGIO/SPINAL RIGHT  03/11/2020   IR ANGIOGRAM SELECTIVE EACH ADDITIONAL VESSEL  03/11/2020   IR ANGIOGRAM SELECTIVE EACH ADDITIONAL VESSEL  03/11/2020   IR US GUIDE VASC ACCESS RIGHT  03/11/2020   RADIOLOGY WITH ANESTHESIA N/A 03/11/2020   Procedure: IR WITH ANESTHESIA;  Surgeon: Radiologist, Medication, MD;  Location: [facility];  Service: Radiology;  Laterality: N/A;    There were no vitals filed for this visit.   Subjective Assessment - 04/26/21 1400     Subjective I was really sore after last visit.  I was exhausted and went home and took a nap.  I think I overdid it.    Even the next day I was toast.  I've been planting pansies.  I reached overhead at [employer] and kind of wobbled but was able to get my balance.    Pertinent History  Gabapentin and Robaxin;  no current restrictions;  typically hypermobile; left hip bursitis; osteopenia    How long can you walk comfortably? 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 Yes    Pain Score 6     Pain Location Back    Pain Radiating Towards both legs                               [facility] Adult PT Treatment/Exercise - 04/26/21 0001       Lumbar Exercises: Standing   Other Standing Lumbar Exercises 2# dumbells lateral rasises 5x; bicep curls 5x    Other Standing Lumbar Exercises yellow band small pull downs 5x; yellow band rows 5x      Lumbar Exercises: Seated   Other Seated Lumbar Exercises 3# ear to hip 5x each side      Lumbar Exercises: Supine   AB Set  Limitations ab brace march 5    Basic Lumbar Stabilization Limitations 2# triceps 5x each    Other Supine Lumbar Exercises 2# dumbells press up 5x    Other Supine Lumbar Exercises 2# bicep curls alternating 5x each side                       PT Short Term Goals - 04/10/21 1925       PT SHORT TERM GOAL #1   Title The patient will be able to initiate a basic HEP for core and LE strengthening (especially) right LE to facilitate return to ADLs    Time 6    Period Weeks    Status New    Target Date 05/22/21      PT SHORT TERM GOAL #2   Title The patient will be able to tolerate 30 min of activity supine, seated and standing    Time 6    Period Weeks    Status New      PT SHORT TERM GOAL #3   Title The patient have improved FOTO score from 34% to 42%    Time 6    Period Weeks    Status New      PT SHORT TERM GOAL #4   Title The patient will be able to walk 10 minutes unassisted    Time 6    Period Weeks    Status New      PT SHORT TERM GOAL #5   Title .Marland KitchenMarland Kitchen               PT  Long Term Goals - 04/10/21 1929       PT LONG TERM GOAL #1   Title The patient will be independent with safe self progression of HEP    Time 12    Period Weeks    Status New    Target Date 07/03/21      PT LONG TERM GOAL #2   Title The patient will tolerate 40 min of sitting and standing activity to prepare for her daughter's wedding    Time 12    Period Weeks    Status New      PT LONG TERM GOAL #3   Title The patient will have improved trunk, UE and LE strength to grossly 3/5 to 3+/5 needed for walking, standing longer periods of time and curb/step negotiation    Time 12    Period Weeks    Status New      PT LONG TERM GOAL #4   Title The patient will demonstrate improved activity tolerance for  walking 20 minutes    Time 12    Period Weeks    Status New      PT LONG TERM GOAL #5   Title FOTO functional outcome score improved to 52%indicating improved function    Time 12    Period Weeks    Status New                   Plan - 04/26/21 1431     Clinical Impression Statement Graded exposure to exercise is necessary secondary to pain in back and LEs easily exacerbated.  Intensity scaled back today from last visit secondary to her reports of extreme soreness and overfatigue lasting 2 days.  Ex time approx 25 total with rest breaks between ex's.  Therapist closely monitoring response and limiting reps to 5 with each.    Personal Factors and Comorbidities  Comorbidity 1;Profession;Comorbidity 2;Comorbidity 3+;Past/Current Experience;Time since onset of injury/illness/exacerbation    Comorbidities osteopenia; hx of left hip bursitis; anxiety; nerve/spinal cord involvement    Examination-Activity Limitations Locomotion Level;Bed Mobility;Reach Overhead;Bend;Caring for Others;Carry;Sleep;Lift;Stand;Hygiene/Grooming;Squat    Examination-Participation Restrictions Meal Prep;Cleaning;Occupation;Community Activity;Driving;Laundry;Medication Management    Rehab Potential Good     PT Frequency 2x / week    PT Duration 12 weeks    PT Treatment/Interventions ADLs/Self Care Home Management;Aquatic Therapy;Cryotherapy;Electrical Stimulation;Ultrasound;Moist Heat;Iontophoresis 4mg /ml Dexamethasone;Functional mobility training;Therapeutic activities;Therapeutic exercise;Neuromuscular re-education;Manual techniques;Patient/family education;Taping;Dry needling    PT Next Visit Plan graded exposure to ex: supine, seated focus on UE movements low reps;  standing ex's short duration    Recommended Other Services pelvic floor eval and treat on 2/22             Patient will benefit from skilled therapeutic intervention in order to improve the following deficits and impairments:  Decreased range of motion, Difficulty walking, Increased fascial restricitons, Increased muscle spasms, Decreased activity tolerance, Impaired perceived functional ability, Pain, Decreased strength, Impaired UE functional use  Visit Diagnosis: Chronic bilateral low back pain, unspecified whether sciatica present  Muscle weakness (generalized)  Difficulty in walking, not elsewhere classified     Problem List Patient Active Problem List   Diagnosis Date Noted   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 03/09/2020   A-V fistula ([place]) 03/09/2020   Intractable low back pain 03/09/2020   Left hip pain 12/10/2018   Osteopenia 05/11/2018   Bursitis of hip 02/18/2017   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

## 2021-05-01 ENCOUNTER — Other Ambulatory Visit: Payer: Self-pay

## 2021-05-01 ENCOUNTER — Ambulatory Visit: Payer: BC Managed Care – PPO | Attending: Registered Nurse | Admitting: Physical Therapy

## 2021-05-01 DIAGNOSIS — M6281 Muscle weakness (generalized): Secondary | ICD-10-CM | POA: Diagnosis not present

## 2021-05-01 DIAGNOSIS — M545 Low back pain, unspecified: Secondary | ICD-10-CM

## 2021-05-01 DIAGNOSIS — R262 Difficulty in walking, not elsewhere classified: Secondary | ICD-10-CM

## 2021-05-01 DIAGNOSIS — G8929 Other chronic pain: Secondary | ICD-10-CM

## 2021-05-01 DIAGNOSIS — R278 Other lack of coordination: Secondary | ICD-10-CM | POA: Diagnosis not present

## 2021-05-01 DIAGNOSIS — M5417 Radiculopathy, lumbosacral region: Secondary | ICD-10-CM | POA: Diagnosis not present

## 2021-05-01 DIAGNOSIS — G894 Chronic pain syndrome: Secondary | ICD-10-CM | POA: Insufficient documentation

## 2021-05-01 NOTE — Therapy (Signed)
[facility] @ [facility] [place], [place], [address] Phone: [phone]   Fax:  [phone]  Physical Therapy Treatment  Patient Details  Name: Taylor Sharp MRN: [id number] Date of Birth: 08-27-1960 Referring Provider (PT): Dr. Sharyon Cable   Encounter Date: 05/01/2021   PT End of Session - 05/01/21 0848     Visit Number 4    Number of Visits 30    Date for PT Re-Evaluation 07/03/21    Authorization Type [employer] 30 visit limit    PT Start Time 0847    PT Stop Time 0913   low tolerance   PT Time Calculation (min) 26 min    Activity Tolerance Patient limited by pain             Past Medical History:  Diagnosis Date   Asthma     Past Surgical History:  Procedure Laterality Date   ABLATION     IR ANGIO/SPINAL LEFT  03/11/2020   IR ANGIO/SPINAL LEFT  03/11/2020   IR ANGIO/SPINAL LEFT  03/11/2020   IR ANGIO/SPINAL LEFT  03/11/2020   IR ANGIO/SPINAL LEFT  03/11/2020   IR ANGIO/SPINAL LEFT  03/11/2020   IR ANGIO/SPINAL LEFT  03/11/2020   IR ANGIO/SPINAL RIGHT  03/11/2020   IR ANGIO/SPINAL RIGHT  03/11/2020   IR ANGIO/SPINAL RIGHT  03/11/2020   IR ANGIO/SPINAL RIGHT  03/11/2020   IR ANGIO/SPINAL RIGHT  03/11/2020   IR ANGIO/SPINAL RIGHT  03/11/2020   IR ANGIOGRAM SELECTIVE EACH ADDITIONAL VESSEL  03/11/2020   IR ANGIOGRAM SELECTIVE EACH ADDITIONAL VESSEL  03/11/2020   IR US GUIDE VASC ACCESS RIGHT  03/11/2020   RADIOLOGY WITH ANESTHESIA N/A 03/11/2020   Procedure: IR WITH ANESTHESIA;  Surgeon: Radiologist, Medication, MD;  Location: [facility];  Service: Radiology;  Laterality: N/A;    There were no vitals filed for this visit.   Subjective Assessment - 05/01/21 0848     Subjective Busy weekend with family events all weekend.  I'm exhausted.  After last visit, my legs hurt, I think from standing.    Pertinent History Gabapentin and Robaxin;  no current restrictions;  typically hypermobile; left hip bursitis; osteopenia    How long can you walk  comfortably? 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 Yes    Pain Score 7     Pain Location Back    Pain Orientation Lower                               [facility] Adult PT Treatment/Exercise - 05/01/21 0001       Lumbar Exercises: Standing   Other Standing Lumbar Exercises yellow band small pull downs 5x; yellow band rows 5x      Lumbar Exercises: Seated   Other Seated Lumbar Exercises 3# ear to hip 5x each side    Other Seated Lumbar Exercises 2 1# front raises and lateral raises 5x each      Lumbar Exercises: Supine   AB Set Limitations ab brace march 5    Basic Lumbar Stabilization Limitations 2# triceps, biceps, press ups 5x each side    Other Supine Lumbar Exercises hand press against beachball 5x each side; diagonals 5x  each side    Other Supine Lumbar Exercises beach ball overhead 5x                       PT Short Term Goals - 05/01/21 1059       PT SHORT TERM GOAL #1   Title The patient will be able to initiate a basic HEP for core and LE strengthening (especially) right LE to facilitate return to ADLs    Time 6    Period Weeks    Status Partially Met      PT SHORT TERM GOAL #2   Title The patient will be able to tolerate 30 min of activity supine, seated and standing    Time 6    Period Weeks    Status Partially Met      PT SHORT TERM GOAL #3   Title The patient have improved FOTO score from 34% to 42%    Time 6    Period Weeks    Status On-going      PT SHORT TERM GOAL #4   Title The patient will be able to walk 10 minutes unassisted    Time 6    Period Weeks    Status On-going               PT Long Term Goals - 04/10/21 1929       PT LONG TERM GOAL #1   Title The patient will be independent with safe self progression  of HEP    Time 12    Period Weeks    Status New    Target Date 07/03/21      PT LONG TERM GOAL #2   Title The patient will tolerate 40 min of sitting and standing activity to prepare for her daughter's wedding    Time 12    Period Weeks    Status New      PT LONG TERM GOAL #3   Title The patient will have improved trunk, UE and LE strength to grossly 3/5 to 3+/5 needed for walking, standing longer periods of time and curb/step negotiation    Time 12    Period Weeks    Status New      PT LONG TERM GOAL #4   Title The patient will demonstrate improved activity tolerance for  walking 20 minutes    Time 12    Period Weeks    Status New      PT LONG TERM GOAL #5   Title FOTO functional outcome score improved to 52%indicating improved function    Time 12    Period Weeks    Status New                   Plan - 05/01/21 6767     Clinical Impression Statement The patient reports exhaustion after a busy weekend of family events.  She is able to participate in low level ex's in supine, seated and limited standing ex's.  Repetition number kept at 5 reps secondary to symptoms easily exacerbated and to avoid fully depleting energy reserves needed for ADLs. She reports lower leg pain increases during session despite the lower intensity.   She will see a pelvic floor PT tomorrow to address incontinence issues.    Personal Factors and Comorbidities Comorbidity 1;Profession;Comorbidity 2;Comorbidity 3+;Past/Current Experience;Time since onset of injury/illness/exacerbation    Comorbidities osteopenia; hx of left hip bursitis; anxiety; nerve/spinal cord involvement    Examination-Activity Limitations Locomotion  Level;Bed Mobility;Reach Overhead;Bend;Caring for Others;Carry;Sleep;Lift;Stand;Hygiene/Grooming;Squat    Rehab Potential Good    PT Frequency 2x / week    PT Duration 12 weeks    PT Treatment/Interventions ADLs/Self Care Home Management;Aquatic Therapy;Cryotherapy;Electrical  Stimulation;Ultrasound;Moist Heat;Iontophoresis 20m/ml Dexamethasone;Functional mobility training;Therapeutic activities;Therapeutic exercise;Neuromuscular re-education;Manual techniques;Patient/family education;Taping;Dry needling    PT Next Visit Plan graded exposure to ex: supine, seated focus on UE movements low reps;  standing ex's short duration;  pelvic floor PT assessment    PT Home Exercise Plan graded exposure to ex: supine, seated focus on UE movements low reps             Patient will benefit from skilled therapeutic intervention in order to improve the following deficits and impairments:  Decreased range of motion, Difficulty walking, Increased fascial restricitons, Increased muscle spasms, Decreased activity tolerance, Impaired perceived functional ability, Pain, Decreased strength, Impaired UE functional use  Visit Diagnosis: Chronic bilateral low back pain, unspecified whether sciatica present  Muscle weakness (generalized)  Difficulty in walking, not elsewhere classified     Problem List Patient Active Problem List   Diagnosis Date Noted   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 03/09/2020   [place]V [place] 03/09/2020   Intractable low back pain 03/09/2020   Left hip pain 12/10/2018   Osteopenia 05/11/2018   Bursitis of hip 02/18/2017   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

## 2021-05-02 ENCOUNTER — Ambulatory Visit: Payer: BC Managed Care – PPO | Admitting: Physical Therapy

## 2021-05-02 ENCOUNTER — Encounter: Payer: Self-pay | Admitting: Physical Therapy

## 2021-05-02 DIAGNOSIS — M6281 Muscle weakness (generalized): Secondary | ICD-10-CM

## 2021-05-02 DIAGNOSIS — R278 Other lack of coordination: Secondary | ICD-10-CM | POA: Diagnosis not present

## 2021-05-02 DIAGNOSIS — G894 Chronic pain syndrome: Secondary | ICD-10-CM | POA: Diagnosis not present

## 2021-05-02 DIAGNOSIS — M5417 Radiculopathy, lumbosacral region: Secondary | ICD-10-CM | POA: Diagnosis not present

## 2021-05-02 NOTE — Therapy (Signed)
[facility] @ [place], [place], [address] Phone: [phone]   Fax:  [phone]  Physical Therapy Evaluation  Patient Details  Name: Taylor Sharp MRN: [id number] Date of Birth: Sep 02, 1960 Referring Provider (PT): Dr. Sharyon Cable   Encounter Date: 05/02/2021   PT End of Session - 05/02/21 1122     Visit Number 1   1 pelvic, 4 ortho   Number of Visits 30    Date for PT Re-Evaluation 07/25/21    Authorization Type BCBS 30 visit limit    Authorization - Visit Number 4    Authorization - Number of Visits 30    PT Start Time 0923    PT Stop Time 1100    PT Time Calculation (min) 45 min    Activity Tolerance Patient tolerated treatment well    Behavior During Therapy [facility] for tasks assessed/performed             Past Medical History:  Diagnosis Date   Asthma     Past Surgical History:  Procedure Laterality Date   ABLATION     IR ANGIO/SPINAL LEFT  03/11/2020   IR ANGIO/SPINAL LEFT  03/11/2020   IR ANGIO/SPINAL LEFT  03/11/2020   IR ANGIO/SPINAL LEFT  03/11/2020   IR ANGIO/SPINAL LEFT  03/11/2020   IR ANGIO/SPINAL LEFT  03/11/2020   IR ANGIO/SPINAL LEFT  03/11/2020   IR ANGIO/SPINAL RIGHT  03/11/2020   IR ANGIO/SPINAL RIGHT  03/11/2020   IR ANGIO/SPINAL RIGHT  03/11/2020   IR ANGIO/SPINAL RIGHT  03/11/2020   IR ANGIO/SPINAL RIGHT  03/11/2020   IR ANGIO/SPINAL RIGHT  03/11/2020   IR ANGIOGRAM SELECTIVE EACH ADDITIONAL VESSEL  03/11/2020   IR ANGIOGRAM SELECTIVE EACH ADDITIONAL VESSEL  03/11/2020   IR US GUIDE VASC ACCESS RIGHT  03/11/2020   RADIOLOGY WITH ANESTHESIA N/A 03/11/2020   Procedure: IR WITH ANESTHESIA;  Surgeon: Radiologist, Medication, MD;  Location: [facility];  Service: Radiology;  Laterality: N/A;    There were no vitals filed for this visit.    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 Patient was in the hospital for 1 week. Left her with cysts in the  sacral area and sitting on S6, S5 area. Patient is working with constant pain and muscle weakness. Progressive. Looking into spinal stimulator. Has a daughter getting married and child graduating from [employer]. Walks with husband.    Pertinent History Gabapentin and Robaxin;  no current restrictions;  typically hypermobile; left hip bursitis; osteopenia    Limitations House hold activities;Walking;Lifting    How long can you walk comfortably? 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     Pelvic Floor Special Questions - 05/02/21 0001     Prior Pregnancies Yes    Number of Pregnancies 3    Number  of Vaginal Deliveries 3    Any difficulty with labor and deliveries Yes   3rd degree tear, forceps, with one child, lots of stitches   Urinary frequency must concentrate to urinate and lean over to fully empty her bladder. When go to the bathroom will have residual stool in the rectum. Patient has increased diarrhea and will mess her underwear. When she has the urgency for a BM must go right away. WAtery stool. Unable to hold the stool when have the urge. Leak stool when passes gas.No trouble with pushing the stool out. Problem with the fecal leakage.    Fecal incontinence Yes   when urinate Type 1, 1 time per week type 1 adn when she urinate, type 6 and 7 3-4 times per week   Skin Integrity Intact    External Palpation tenderness located on the perineal body    Pelvic Floor Internal Exam Patient confirms identification and approves PT to assess pelvic floor and treatment    Exam Type  Rectal    Sensation trouble telling if she is able to do a contraction with the anus    Palpation at first will bulge the pelvic floor and contract the gluteals instead, when therapist has the patient to breath into her abdomen and as she breaths out to contract the pelvic floor    Strength Flicker                       PT Education - 05/02/21 1104     Education Details Patient will get script for her home muscle stimulator for the anus from the doctor    Person(s) Educated Patient    Methods Explanation    Comprehension Verbalized understanding              PT Short Term Goals - 05/01/21 1059       PT SHORT TERM GOAL #1   Title The patient will be able to initiate a basic HEP for core and LE strengthening (especially) right LE to facilitate return to ADLs    Time 6    Period Weeks    Status Partially Met      PT SHORT TERM GOAL #2   Title The patient will be able to tolerate 30 min of activity supine, seated and standing    Time 6    Period Weeks    Status Partially Met      PT SHORT TERM GOAL #3   Title The patient have improved FOTO score from 34% to 42%    Time 6    Period Weeks    Status On-going      PT SHORT TERM GOAL #4   Title The patient will be able to walk 10 minutes unassisted    Time 6    Period Weeks    Status On-going               PT Long Term Goals - 05/02/21 1118       Additional Long Term Goals   Additional Long Term Goals Yes      PT LONG TERM GOAL #6   Title Patient is able to have the urge to have a bowel movement and able to walk to the bathroom with minimal to no fecal leakage 50% of the time    Time 12    Period Weeks    Status New    Target Date 07/25/21      PT LONG TERM GOAL #  7   Title Anal strength >/= 3/5 and holding for 10 seconds so she does not soil her underwear or pad 50% of the time.    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 Patient reports she has stool leakage several times per week. She will have the urge to have a bowel movement and not able to make it to the bathroom. Seh will urinate and have Type 1 stool. ! time per week has type 4 and 3-4 times per week has type 6 or 7. She is very concerned about the diarrhea and working with an inflammator diet to see if this helps. Patient is wearing a mini pad but is looking into a depends due to the fecal leakage. Anal strength for puborectalis and internal anal sphincter is 1/5 and external anal sphincter is 0/5. Patient will bulge the pelvic floor when therapist asks her to contract. Patient is able to isolate the pelvic floor when the therapist has her contract the lower abdominals. Patient may benefit from a muscle stimulator for the anus to work on contracting the anus to reduce her leakage. Patient reports no urinary leakage. She has no difficulty with pushing the stool out or fully emptying her bladder. Patient will benefit from skilled therapy to improve pelvic floor contraction to reduce fecal leakage.    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    Examination-Activity Limitations Continence;Toileting;Locomotion Level    Stability/Clinical Decision Making Evolving/Moderate complexity    Clinical Decision Making Moderate    Rehab Potential Good    PT Frequency 1x  / week    PT Duration 12 weeks    PT Treatment/Interventions ADLs/Self Care Home Management;Biofeedback;Therapeutic activities;Therapeutic exercise;Neuromuscular re-education;Patient/family education;Manual techniques;Electrical Stimulation    PT Next Visit Plan look into anal muscle stimulator; use biofeedback and tactile cues to contract the anus in sidely, work on lower abodminal contraction    Consulted and Agree with Plan of Care Patient             Patient will benefit from skilled therapeutic intervention in order to improve the following deficits and impairments:  Decreased coordination, Decreased strength, Decreased activity tolerance, Decreased endurance  Visit Diagnosis: Muscle weakness (generalized)  Other lack of coordination     Problem List Patient Active Problem List   Diagnosis Date Noted   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 03/09/2020   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

## 2021-05-03 ENCOUNTER — Ambulatory Visit: Payer: BC Managed Care – PPO | Admitting: Physical Therapy

## 2021-05-03 ENCOUNTER — Other Ambulatory Visit: Payer: Self-pay

## 2021-05-03 DIAGNOSIS — R278 Other lack of coordination: Secondary | ICD-10-CM

## 2021-05-03 DIAGNOSIS — G8929 Other chronic pain: Secondary | ICD-10-CM

## 2021-05-03 DIAGNOSIS — R262 Difficulty in walking, not elsewhere classified: Secondary | ICD-10-CM | POA: Diagnosis not present

## 2021-05-03 DIAGNOSIS — M6281 Muscle weakness (generalized): Secondary | ICD-10-CM

## 2021-05-03 DIAGNOSIS — M545 Low back pain, unspecified: Secondary | ICD-10-CM | POA: Diagnosis not present

## 2021-05-03 NOTE — Therapy (Signed)
[facility] @ [facility], [place], [address] Phone: [phone]   Fax:  [phone]  Physical Therapy Treatment  Patient Details  Name: Taylor Sharp MRN: [id number] Date of Birth: 1960/04/26 Referring Provider (PT): Dr. Sharyon Cable   Encounter Date: 05/03/2021   PT End of Session - 05/03/21 0848     Visit Number 5   1 pelvic   Number of Visits 30    Date for PT Re-Evaluation 07/25/21    Authorization Type BCBS 30 visit limit  today 6 total/30    Authorization - Number of Visits 30    PT Start Time 0845    PT Stop Time 0923    PT Time Calculation (min) 38 min    Activity Tolerance Patient tolerated treatment well             Past Medical History:  Diagnosis Date   Asthma     Past Surgical History:  Procedure Laterality Date   ABLATION     IR ANGIO/SPINAL LEFT  03/11/2020   IR ANGIO/SPINAL LEFT  03/11/2020   IR ANGIO/SPINAL LEFT  03/11/2020   IR ANGIO/SPINAL LEFT  03/11/2020   IR ANGIO/SPINAL LEFT  03/11/2020   IR ANGIO/SPINAL LEFT  03/11/2020   IR ANGIO/SPINAL LEFT  03/11/2020   IR ANGIO/SPINAL RIGHT  03/11/2020   IR ANGIO/SPINAL RIGHT  03/11/2020   IR ANGIO/SPINAL RIGHT  03/11/2020   IR ANGIO/SPINAL RIGHT  03/11/2020   IR ANGIO/SPINAL RIGHT  03/11/2020   IR ANGIO/SPINAL RIGHT  03/11/2020   IR ANGIOGRAM SELECTIVE EACH ADDITIONAL VESSEL  03/11/2020   IR ANGIOGRAM SELECTIVE EACH ADDITIONAL VESSEL  03/11/2020   IR US GUIDE VASC ACCESS RIGHT  03/11/2020   RADIOLOGY WITH ANESTHESIA N/A 03/11/2020   Procedure: IR WITH ANESTHESIA;  Surgeon: Radiologist, Medication, MD;  Location: [facility];  Service: Radiology;  Laterality: N/A;    There were no vitals filed for this visit.   Subjective Assessment - 05/03/21 0852     Subjective I was wiped out for 2 days after last visit.  Had pelvic floor visit yesterday.    Pertinent History Gabapentin and Robaxin;  no current restrictions;  typically hypermobile; left hip bursitis; osteopenia     Currently in Pain? Yes    Pain Score 6     Pain Location Back    Pain Radiating Towards both legs                               [facility] Adult PT Treatment/Exercise - 05/03/21 0001       Lumbar Exercises: Aerobic   UBE (Upper Arm Bike) 30 sec forward/30 backward    Nustep 3 min 30spm average      Lumbar Exercises: Machines for Strengthening   Other Lumbar Machine Exercise standing triceps 10# 5x    Other Lumbar Machine Exercise standing lat bar      Lumbar Exercises: Seated   Other Seated Lumbar Exercises side push up 5x right/left    Other Seated Lumbar Exercises foam roll push down 7x 5 sec holds      Lumbar Exercises: Supine   Other Supine Lumbar Exercises 4# weight on cane bench press 5x; biceps cane 4# 5x;    Other Supine Lumbar Exercises static hold 4# cane with ball squeeze 5x  PT Short Term Goals - 05/01/21 1059       PT SHORT TERM GOAL #1   Title The patient will be able to initiate a basic HEP for core and LE strengthening (especially) right LE to facilitate return to ADLs    Time 6    Period Weeks    Status Partially Met      PT SHORT TERM GOAL #2   Title The patient will be able to tolerate 30 min of activity supine, seated and standing    Time 6    Period Weeks    Status Partially Met      PT SHORT TERM GOAL #3   Title The patient have improved FOTO score from 34% to 42%    Time 6    Period Weeks    Status On-going      PT SHORT TERM GOAL #4   Title The patient will be able to walk 10 minutes unassisted    Time 6    Period Weeks    Status On-going               PT Long Term Goals - 05/02/21 1118       Additional Long Term Goals   Additional Long Term Goals Yes      PT LONG TERM GOAL #6   Title Patient is able to have the urge to have a bowel movement and able to walk to the bathroom with minimal to no fecal leakage 50% of the time    Time 12    Period Weeks    Status New    Target  Date 07/25/21      PT LONG TERM GOAL #7   Title Anal strength >/= 3/5 and holding for 10 seconds so she does not soil her underwear or pad 50% of the time.    Time 12    Period Weeks    Status New    Target Date 07/25/21      PT LONG TERM GOAL #8   Title understands how to use the anal muscle stimulator to work on anal strength to reduce weakness    Time 12    Period Weeks    Status New    Target Date 07/25/21                   Plan - 05/03/21 1032     Clinical Impression Statement The patient is able to perform a small progression of exercise to include short duration seated aerobic ex and 2 standing upper quarter light -resistance ex's.  She reports tingling in right calf with supine ex's that persists for the duration of treatment but patient feels she can continue to "work through" this.  Response very closely monitored throughout session and modified treatment accordingly.    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    Examination-Participation Restrictions Meal Prep;Cleaning;Occupation;Community Activity;Driving;Laundry;Medication Management    Rehab Potential Good    PT Frequency 1x / week    PT Duration 12 weeks    PT Treatment/Interventions ADLs/Self Care Home Management;Biofeedback;Therapeutic activities;Therapeutic exercise;Neuromuscular re-education;Patient/family education;Manual techniques;Electrical Stimulation    PT Next Visit Plan graded exposure to ex: supine, seated focus on UE movements low reps; Nu step 3 min; UBE 1 min; triceps standing; lat bar standing lowest weight  Patient will benefit from skilled therapeutic intervention in order to improve the following deficits and impairments:  Decreased coordination,  Decreased strength, Decreased activity tolerance, Decreased endurance  Visit Diagnosis: Muscle weakness (generalized)  Other lack of coordination  Chronic bilateral low back pain, unspecified whether sciatica present     Problem List Patient Active Problem List   Diagnosis Date Noted   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 03/09/2020   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

## 2021-05-04 DIAGNOSIS — R159 Full incontinence of feces: Secondary | ICD-10-CM | POA: Diagnosis not present

## 2021-05-04 DIAGNOSIS — R32 Unspecified urinary incontinence: Secondary | ICD-10-CM | POA: Diagnosis not present

## 2021-05-04 DIAGNOSIS — E538 Deficiency of other specified B group vitamins: Secondary | ICD-10-CM | POA: Diagnosis not present

## 2021-05-08 ENCOUNTER — Encounter: Payer: BC Managed Care – PPO | Admitting: Physical Therapy

## 2021-05-10 ENCOUNTER — Other Ambulatory Visit: Payer: Self-pay

## 2021-05-10 ENCOUNTER — Ambulatory Visit: Payer: BC Managed Care – PPO | Attending: Neurosurgery | Admitting: Physical Therapy

## 2021-05-10 DIAGNOSIS — M545 Low back pain, unspecified: Secondary | ICD-10-CM | POA: Insufficient documentation

## 2021-05-10 DIAGNOSIS — M6281 Muscle weakness (generalized): Secondary | ICD-10-CM | POA: Diagnosis not present

## 2021-05-10 DIAGNOSIS — R278 Other lack of coordination: Secondary | ICD-10-CM | POA: Insufficient documentation

## 2021-05-10 DIAGNOSIS — G8929 Other chronic pain: Secondary | ICD-10-CM | POA: Diagnosis not present

## 2021-05-10 NOTE — Therapy (Signed)
[facility] ?[facility] [place] ?[place], [place], [address] ?Phone: [phone]   Fax:  [phone] ? ?Physical Therapy Treatment ? ?Patient Details  ?Name: Taylor Sharp ?MRN: [id number] ?Date of Birth: 1960/05/30 ?Referring Provider (PT): Dr. Sharyon Cable ? ? ?Encounter Date: 05/10/2021 ? ? PT End of Session - 05/10/21 0926   ? ? Visit Number 6   ? Number of Visits 30   ? Date for PT Re-Evaluation 07/25/21   ? Authorization Type BCBS 30 visit limit  today 7 total/30 including pelvic floor   ? Authorization - Visit Number 7   ? Authorization - Number of Visits 30   ? PT Start Time 0845   ? PT Stop Time [id number]   ? PT Time Calculation (min) 38 min   ? Activity Tolerance Patient limited by pain   ? ?  ?  ? ?  ? ? ?Past Medical History:  ?Diagnosis Date  ? Asthma   ? ? ?Past Surgical History:  ?Procedure Laterality Date  ? ABLATION    ? IR ANGIO/SPINAL LEFT  03/11/2020  ? IR ANGIO/SPINAL LEFT  03/11/2020  ? IR ANGIO/SPINAL LEFT  03/11/2020  ? IR ANGIO/SPINAL LEFT  03/11/2020  ? IR ANGIO/SPINAL LEFT  03/11/2020  ? IR ANGIO/SPINAL LEFT  03/11/2020  ? IR ANGIO/SPINAL LEFT  03/11/2020  ? IR ANGIO/SPINAL RIGHT  03/11/2020  ? IR ANGIO/SPINAL RIGHT  03/11/2020  ? IR ANGIO/SPINAL RIGHT  03/11/2020  ? IR ANGIO/SPINAL RIGHT  03/11/2020  ? IR ANGIO/SPINAL RIGHT  03/11/2020  ? IR ANGIO/SPINAL RIGHT  03/11/2020  ? IR ANGIOGRAM SELECTIVE EACH ADDITIONAL VESSEL  03/11/2020  ? IR ANGIOGRAM SELECTIVE EACH ADDITIONAL VESSEL  03/11/2020  ? IR US GUIDE VASC ACCESS RIGHT  03/11/2020  ? RADIOLOGY WITH ANESTHESIA N/A 03/11/2020  ? Procedure: IR WITH ANESTHESIA;  Surgeon: Radiologist, Medication, MD;  Location: [facility];  Service: Radiology;  Laterality: N/A;  ? ? ?There were no vitals filed for this visit. ? ? Subjective Assessment - 05/10/21 0848   ? ? Subjective I was sore but I did OK after last time.  It was tolerable.  I just get tired.  I like doing the machines b/c eventually I could do it at [employer].   ? Pertinent History  Gabapentin and Robaxin;  no current restrictions;  typically hypermobile; left hip bursitis; osteopenia   ? How long can you walk comfortably? If my husband is with me 1-2 miles holding onto him;  in cul-de-sac only by myself   ? Patient Stated Goals get ready for daughter's wedding in June   ? Currently in Pain? Yes   ? Pain Score 6    ? Pain Location Back   ? Pain Type Chronic pain   ? ?  ?  ? ?  ? ? ? ? ? ? ? ? ? ? ? ? ? ? ? ? ? ? ? ? [facility] Adult PT Treatment/Exercise - 05/10/21 0001   ? ?  ? Self-Care  ? Other Self-Care Comments  discussion on reps vs weights for home ex's   ?  ? Lumbar Exercises: Aerobic  ? UBE (Upper Arm Bike) 30 sec forward/30 backward 1.0   ? Nustep 3 min 30spm average   30 spm  ?  ? Lumbar Exercises: Machines for Strengthening  ? Other Lumbar Machine Exercise standing triceps 10# 10x   ? Other Lumbar Machine Exercise standing lat bar 25# 7 reps   ?  ?  Lumbar Exercises: Seated  ? Other Seated Lumbar Exercises --   ? Other Seated Lumbar Exercises foam roll push down 7x 5 sec holds   ?  ? Lumbar Exercises: Supine  ? AB Set Limitations 3# diagonal chops 5x to each side   ? Other Supine Lumbar Exercises 3# single arm press 5x each side   ? Other Supine Lumbar Exercises static hold 3# while march 5x each side   ? ?  ?  ? ?  ? ? ? ? ? ? ? ? ? ? ? ? PT Short Term Goals - 05/10/21 0928   ? ?  ? PT SHORT TERM GOAL #1  ? Title The patient will be able to initiate a basic HEP for core and LE strengthening (especially) right LE to facilitate return to ADLs   ? Status Partially Met   ? Target Date 05/22/21   ?  ? PT SHORT TERM GOAL #2  ? Title The patient will be able to tolerate 30 min of activity supine, seated and standing   ? Status Achieved   ?  ? PT SHORT TERM GOAL #3  ? Title The patient have improved FOTO score from 34% to 42%   ? Time 6   ? Period Weeks   ? Status On-going   ?  ? PT SHORT TERM GOAL #4  ? Title The patient will be able to walk 10 minutes unassisted   ? Time 6   ? Period Weeks   ?  Status On-going   ? ?  ?  ? ?  ? ? ? ? PT Long Term Goals - 05/02/21 1118   ? ?  ? Additional Long Term Goals  ? Additional Long Term Goals Yes   ?  ? PT LONG TERM GOAL #6  ? Title Patient is able to have the urge to have a bowel movement and able to walk to the bathroom with minimal to no fecal leakage 50% of the time   ? Time 12   ? Period Weeks   ? Status New   ? Target Date 07/25/21   ?  ? PT LONG TERM GOAL #7  ? Title Anal strength >/= 3/5 and holding for 10 seconds so she does not soil her underwear or pad 50% of the time.   ? Time 12   ? Period Weeks   ? Status New   ? Target Date 07/25/21   ?  ? PT LONG TERM GOAL #8  ? Title understands how to use the anal muscle stimulator to work on anal strength to reduce weakness   ? Time 12   ? Period Weeks   ? Status New   ? Target Date 07/25/21   ? ?  ?  ? ?  ? ? ? ? ? ? ? ? Plan - 05/10/21 0929   ? ? Clinical Impression Statement The patient is able to perform 38 minutes of exercise at a slow pace and limited repetitions for appropriate scaling of intensity.  She reports LE tingling and discomfort but her primary complaint is fatigue > pain today.   Therapist very closely monitoring her response and modifying treatment based on that response.  Alternating between standing and sitting works well in addition to doing "harder" ex's in the beginning, supine towards the end of session.   ? Personal Factors and Comorbidities Comorbidity 1;Profession;Comorbidity 2;Comorbidity 3+;Past/Current Experience;Time since onset of injury/illness/exacerbation   ? 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 Examination-Participation Restrictions Meal Prep;Cleaning;Occupation;Community Activity;Driving;Laundry;Medication Management   ? Rehab Potential Good   ? PT Frequency 2x / week   ? PT Duration 12 weeks   ? PT Treatment/Interventions ADLs/Self Care Home Management;Biofeedback;Therapeutic activities;Therapeutic  exercise;Neuromuscular re-education;Patient/family education;Manual techniques;Electrical Stimulation   ? PT Next Visit Plan graded exposure to ex: supine, seated focus on UE movements low reps; Nu step 3 min; UBE 1 min; triceps standing; lat bar standing lowest weight;  FOTO in 1 week   ? ?  ?  ? ?  ? ? ?Patient will benefit from skilled therapeutic intervention in order to improve the following deficits and impairments:  Decreased coordination, Decreased strength, Decreased activity tolerance, Decreased endurance ? ?Visit Diagnosis: ?Muscle weakness (generalized) ? ?Other lack of coordination ? ?Chronic bilateral low back pain, unspecified whether sciatica present ? ? ? ? ?Problem List ?Patient Active Problem List  ? Diagnosis Date Noted  ? 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 Intractable low back pain 03/09/2020  ? Left hip pain 12/10/2018  ? Osteopenia 05/11/2018  ? Bursitis of hip 02/18/2017  ? 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 GERD (gastroesophageal reflux disease) 08/04/2014  ? Upper airway cough syndrome 08/04/2014  ? Chest discomfort 05/25/2014  ? ?Ruben Im, PT ?05/10/21 10:39 AM ?Phone: [phone] ?Fax: [phone]  ?Alvera Singh, PT ?05/10/2021, 10:39 AM ? ?[facility] ?[facility] @ [facility] ?[place], [place], [address] ?Phone: [phone]   Fax:  [phone] ? ?Name: Taylor Sharp ?MRN: [id number] ?Date of Birth: [birth year] ? ? ? ?

## 2021-05-11 ENCOUNTER — Encounter: Payer: Self-pay | Admitting: Physical Therapy

## 2021-05-11 ENCOUNTER — Ambulatory Visit: Payer: BC Managed Care – PPO | Attending: Registered Nurse | Admitting: Physical Therapy

## 2021-05-11 DIAGNOSIS — R278 Other lack of coordination: Secondary | ICD-10-CM | POA: Insufficient documentation

## 2021-05-11 DIAGNOSIS — M6281 Muscle weakness (generalized): Secondary | ICD-10-CM | POA: Insufficient documentation

## 2021-05-11 NOTE — Patient Instructions (Signed)
Access Code: [email or web address] ?URL: [email or web address] ?Date: 05/11/2021 ?Prepared by: Earlie Counts ? ?Exercises ?Supine Hip Adduction Isometric with Ball - 1 x daily - 7 x weekly - 1 sets - 10 reps - 5 sec hold ?Sidelying Pelvic Floor Contraction with Self-Palpation - 3 x daily - 7 x weekly - 1 sets - 10 reps - 5-10 hold ?[facility] ?[address], [address] ?[place], [place] [address] ?Phone # [phone] ?Fax [phone] ? ?

## 2021-05-11 NOTE — Therapy (Signed)
[place] ?[facility] [place] ?[place], [place], [address] ?Phone: [phone]   Fax:  [phone] ? ?Physical Therapy Treatment ? ?Patient Details  ?Name: Taylor Sharp ?MRN: [id number] ?Date of Birth: Nov 30, 1960 ?Referring Provider (PT): Dr. Sharyon Cable ? ? ?Encounter Date: 05/11/2021 ? ? PT End of Session - 05/11/21 1020   ? ? Visit Number 7   ? Date for PT Re-Evaluation 07/25/21   ? Authorization Type BCBS 30 visit limit  today 7 total/30 including pelvic floor   ? Authorization - Visit Number 8   ? Authorization - Number of Visits 30   ? PT Start Time 1015   ? PT Stop Time 5462   ? PT Time Calculation (min) 38 min   ? Activity Tolerance Patient limited by pain;Patient tolerated treatment well   ? Behavior During Therapy [facility] for tasks assessed/performed   ? ?  ?  ? ?  ? ? ?Past Medical History:  ?Diagnosis Date  ? Asthma   ? ? ?Past Surgical History:  ?Procedure Laterality Date  ? ABLATION    ? IR ANGIO/SPINAL LEFT  03/11/2020  ? IR ANGIO/SPINAL LEFT  03/11/2020  ? IR ANGIO/SPINAL LEFT  03/11/2020  ? IR ANGIO/SPINAL LEFT  03/11/2020  ? IR ANGIO/SPINAL LEFT  03/11/2020  ? IR ANGIO/SPINAL LEFT  03/11/2020  ? IR ANGIO/SPINAL LEFT  03/11/2020  ? IR ANGIO/SPINAL RIGHT  03/11/2020  ? IR ANGIO/SPINAL RIGHT  03/11/2020  ? IR ANGIO/SPINAL RIGHT  03/11/2020  ? IR ANGIO/SPINAL RIGHT  03/11/2020  ? IR ANGIO/SPINAL RIGHT  03/11/2020  ? IR ANGIO/SPINAL RIGHT  03/11/2020  ? IR ANGIOGRAM SELECTIVE EACH ADDITIONAL VESSEL  03/11/2020  ? IR ANGIOGRAM SELECTIVE EACH ADDITIONAL VESSEL  03/11/2020  ? IR US GUIDE VASC ACCESS RIGHT  03/11/2020  ? RADIOLOGY WITH ANESTHESIA N/A 03/11/2020  ? Procedure: IR WITH ANESTHESIA;  Surgeon: Radiologist, Medication, MD;  Location: [facility];  Service: Radiology;  Laterality: N/A;  ? ? ?There were no vitals filed for this visit. ? ? Subjective Assessment - 05/11/21 1022   ? ? Subjective I have talked to the device people and it is expensive.   ? Pertinent History Gabapentin and Robaxin;   no current restrictions;  typically hypermobile; left hip bursitis; osteopenia   ? Limitations House hold activities;Walking;Lifting   ? How long can you walk comfortably? If my husband is with me 1-2 miles holding onto him;  in cul-de-sac only by myself   ? 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 Patient Stated Goals get ready for daughter's wedding in June; reduce fecal continence   ? Currently in Pain? Yes   ? Pain Score 6    ? Pain Location Back   ? Pain Orientation Lower   ? Pain Radiating Towards both legs   ? Aggravating Factors  walking, driving daugher to school; up and down steps, sitting   ? Pain Relieving Factors supine knees bent with arms underneath me   ? Multiple Pain Sites No   ? ?  ?  ? ?  ? ? ? ? ? ? ? ? ? ? ? ? ? ? ? ? ? Pelvic Floor Special Questions - 05/11/21 0001   ? ? Biofeedback sidely contract for 10 seconds while looking at the graph and working on control   ? Biofeedback sensor type Surface   rectal  ? ?  ?  ? ?  ? ? ? ?  [facility] Adult PT Treatment/Exercise - 05/11/21 0001   ? ?  ? Self-Care  ? Self-Care Other Self-Care Comments   ? Other Self-Care Comments  educated patient on another stimulator to stimulate the anal sphincter   ?  ? Lumbar Exercises: Supine  ? Ab Set 10 reps;5 seconds   ? AB Set Limitations with ball squeeze and verbal cues   ? ?  ?  ? ?  ? ? ? ? ? ? ? ? ? ? PT Education - 05/11/21 1050   ? ? Education Details Access Code: [email or web address]   ? Person(s) Educated Patient   ? Methods Explanation;Demonstration;Handout;Verbal cues   ? Comprehension Returned demonstration;Verbalized understanding   ? ?  ?  ? ?  ? ? ? PT Short Term Goals - 05/10/21 0928   ? ?  ? PT SHORT TERM GOAL #1  ? Title The patient will be able to initiate a basic HEP for core and LE strengthening (especially) right LE to facilitate return to ADLs   ? Status Partially Met   ? Target Date 05/22/21   ?  ? PT SHORT TERM GOAL #2  ? Title The patient will be able to tolerate 30 min of  activity supine, seated and standing   ? Status Achieved   ?  ? PT SHORT TERM GOAL #3  ? Title The patient have improved FOTO score from 34% to 42%   ? Time 6   ? Period Weeks   ? Status On-going   ?  ? PT SHORT TERM GOAL #4  ? Title The patient will be able to walk 10 minutes unassisted   ? Time 6   ? Period Weeks   ? Status On-going   ? ?  ?  ? ?  ? ? ? ? PT Long Term Goals - 05/02/21 1118   ? ?  ? Additional Long Term Goals  ? Additional Long Term Goals Yes   ?  ? PT LONG TERM GOAL #6  ? Title Patient is able to have the urge to have a bowel movement and able to walk to the bathroom with minimal to no fecal leakage 50% of the time   ? Time 12   ? Period Weeks   ? Status New   ? Target Date 07/25/21   ?  ? PT LONG TERM GOAL #7  ? Title Anal strength >/= 3/5 and holding for 10 seconds so she does not soil her underwear or pad 50% of the time.   ? Time 12   ? Period Weeks   ? Status New   ? Target Date 07/25/21   ?  ? PT LONG TERM GOAL #8  ? Title understands how to use the anal muscle stimulator to work on anal strength to reduce weakness   ? Time 12   ? Period Weeks   ? Status New   ? Target Date 07/25/21   ? ?  ?  ? ?  ? ? ? ? ? ? ? ? Plan - 05/11/21 1034   ? ? Clinical Impression Statement Patient is able to contract the anus using the pelvic floor EMG to 18 uv from 10 uv. for 10 seconds. Patient has to think about the contraction to make the muscle work. Patient fatiques with exercise. Patients muscles need assistance from the gluteal and hip adductors to contract the pelvic floor. She does have trouble feeling the muscle contract. Patient will benefit from skilled therapy to work  on anal contraction and endurance to reduce fecal leakage.   ? Personal Factors and Comorbidities Comorbidity 1;Profession;Comorbidity 2;Comorbidity 3+;Past/Current Experience;Time since onset of injury/illness/exacerbation   ? 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 Examination-Activity Limitations Continence;Toileting;Locomotion Level   ? Examination-Participation Restrictions Meal Prep;Cleaning;Occupation;Community Activity;Driving;Laundry;Medication Management   ? Stability/Clinical Decision Making Evolving/Moderate complexity   ? Rehab Potential Good   ? PT Frequency 1x / week   ? PT Duration 12 weeks   ? PT Treatment/Interventions ADLs/Self Care Home Management;Biofeedback;Therapeutic activities;Therapeutic exercise;Neuromuscular re-education;Patient/family education;Manual techniques;Electrical Stimulation   ? PT Next Visit Plan work with pelvic floor EMG in gravity eliminated position, see if she decides to get the stimulation unit   ? PT Home Exercise Plan Access Code: [email or web address]   ? Consulted and Agree with Plan of Care Patient   ? ?  ?  ? ?  ? ? ?Patient will benefit from skilled therapeutic intervention in order to improve the following deficits and impairments:  Decreased coordination, Decreased strength, Decreased activity tolerance, Decreased endurance ? ?Visit Diagnosis: ?Muscle weakness (generalized) ? ?Other lack of coordination ? ? ? ? ?Problem List ?Patient Active Problem List  ? Diagnosis Date Noted  ? 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 Intractable low back pain 03/09/2020  ? Left hip pain 12/10/2018  ? Osteopenia 05/11/2018  ? Bursitis of hip 02/18/2017  ? 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 GERD (gastroesophageal reflux disease) 08/04/2014  ? Upper airway cough syndrome 08/04/2014  ? Chest discomfort 05/25/2014  ? ? ?Earlie Counts, PT ?05/11/21 10:58 AM ? ? ?[place] ?[facility] ?[place], [place], [address] ?Phone: [phone]   Fax:  [phone] ? ?Name: Taylor Sharp ?MRN: [id number] ?Date of Birth: 1961-01-08 ? ? ? ?

## 2021-05-15 ENCOUNTER — Ambulatory Visit: Payer: BC Managed Care – PPO | Admitting: Physical Therapy

## 2021-05-15 ENCOUNTER — Other Ambulatory Visit: Payer: Self-pay

## 2021-05-15 DIAGNOSIS — R278 Other lack of coordination: Secondary | ICD-10-CM

## 2021-05-15 DIAGNOSIS — M545 Low back pain, unspecified: Secondary | ICD-10-CM | POA: Diagnosis not present

## 2021-05-15 DIAGNOSIS — G8929 Other chronic pain: Secondary | ICD-10-CM | POA: Diagnosis not present

## 2021-05-15 DIAGNOSIS — M6281 Muscle weakness (generalized): Secondary | ICD-10-CM

## 2021-05-15 NOTE — Therapy (Signed)
[place] ?[facility] [facility] ?[place], [place], [address] ?Phone: [phone]   Fax:  [phone] ? ?Physical Therapy Treatment ? ?Patient Details  ?Name: Taylor Sharp ?MRN: [id number] ?Date of Birth: [birth year] ?Referring Provider (PT): Dr. Sharyon Cable ? ? ?Encounter Date: 05/15/2021 ? ? PT End of Session - 05/15/21 0844   ? ? Visit Number 8   ? Number of Visits 30   ? Date for PT Re-Evaluation 07/25/21   ? Authorization Type BCBS 30 visit limit  today 8 total/30 including pelvic floor   ? Authorization - Visit Number 9   ? Authorization - Number of Visits 30   ? PT Start Time 0845   ? PT Stop Time 0925   ? PT Time Calculation (min) 40 min   ? Activity Tolerance Patient limited by pain   ? ?  ?  ? ?  ? ? ?Past Medical History:  ?Diagnosis Date  ? Asthma   ? ? ?Past Surgical History:  ?Procedure Laterality Date  ? ABLATION    ? IR ANGIO/SPINAL LEFT  03/11/2020  ? IR ANGIO/SPINAL LEFT  03/11/2020  ? IR ANGIO/SPINAL LEFT  03/11/2020  ? IR ANGIO/SPINAL LEFT  03/11/2020  ? IR ANGIO/SPINAL LEFT  03/11/2020  ? IR ANGIO/SPINAL LEFT  03/11/2020  ? IR ANGIO/SPINAL LEFT  03/11/2020  ? IR ANGIO/SPINAL RIGHT  03/11/2020  ? IR ANGIO/SPINAL RIGHT  03/11/2020  ? IR ANGIO/SPINAL RIGHT  03/11/2020  ? IR ANGIO/SPINAL RIGHT  03/11/2020  ? IR ANGIO/SPINAL RIGHT  03/11/2020  ? IR ANGIO/SPINAL RIGHT  03/11/2020  ? IR ANGIOGRAM SELECTIVE EACH ADDITIONAL VESSEL  03/11/2020  ? IR ANGIOGRAM SELECTIVE EACH ADDITIONAL VESSEL  03/11/2020  ? IR US GUIDE VASC ACCESS RIGHT  03/11/2020  ? RADIOLOGY WITH ANESTHESIA N/A 03/11/2020  ? Procedure: IR WITH ANESTHESIA;  Surgeon: Radiologist, Medication, MD;  Location: [facility];  Service: Radiology;  Laterality: N/A;  ? ? ?There were no vitals filed for this visit. ? ? Subjective Assessment - 05/15/21 0848   ? ? Subjective I walked my dog yesterday.  I did OK after last time.  I've been doing my ex's.   ? Pertinent History Gabapentin and Robaxin;  no current restrictions;  typically  hypermobile; left hip bursitis; osteopenia   ? How long can you walk comfortably? If my husband is with me 1-2 miles holding onto him;  in cul-de-sac only by myself   ? 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 Currently in Pain? Yes   ? Pain Score 6    ? Pain Location Back   knee from the dog hitting it  ? ?  ?  ? ?  ? ? ? ? ? ? ? ? ? ? ? ? ? ? ? ? ? ? ? ? [facility] Adult PT Treatment/Exercise - 05/15/21 0001   ? ?  ? Lumbar Exercises: Aerobic  ? UBE (Upper Arm Bike) 30 sec forward/30 backward 1.5 min   ? Nustep 4 min 30spm average   30 spm  ?  ? Lumbar Exercises: Machines for Strengthening  ? Other Lumbar Machine Exercise --   ? Other Lumbar Machine Exercise standing lat bar 25# 7 reps   ?  ? Lumbar Exercises: Seated  ? Other Seated Lumbar Exercises seated cable row 10# 5x   ? Other Seated Lumbar Exercises foam roll push down 7x 5 sec holds   ?  ? Lumbar Exercises: Supine  ?  AB Set Limitations 3# diagonal chops 5x to each side   ? Other Supine Lumbar Exercises 3# bil arm press 7x   ? Other Supine Lumbar Exercises static hold 3# while march 5x each side   ? ?  ?  ? ?  ? ? ? ? ? ? ? ? ? ? ? ? PT Short Term Goals - 05/10/21 0928   ? ?  ? PT SHORT TERM GOAL #1  ? Title The patient will be able to initiate a basic HEP for core and LE strengthening (especially) right LE to facilitate return to ADLs   ? Status Partially Met   ? Target Date 05/22/21   ?  ? PT SHORT TERM GOAL #2  ? Title The patient will be able to tolerate 30 min of activity supine, seated and standing   ? Status Achieved   ?  ? PT SHORT TERM GOAL #3  ? Title The patient have improved FOTO score from 34% to 42%   ? Time 6   ? Period Weeks   ? Status On-going   ?  ? PT SHORT TERM GOAL #4  ? Title The patient will be able to walk 10 minutes unassisted   ? Time 6   ? Period Weeks   ? Status On-going   ? ?  ?  ? ?  ? ? ? ? PT Long Term Goals - 05/02/21 1118   ? ?  ? Additional Long Term Goals  ? Additional Long Term Goals Yes   ?  ? PT  LONG TERM GOAL #6  ? Title Patient is able to have the urge to have a bowel movement and able to walk to the bathroom with minimal to no fecal leakage 50% of the time   ? Time 12   ? Period Weeks   ? Status New   ? Target Date 07/25/21   ?  ? PT LONG TERM GOAL #7  ? Title Anal strength >/= 3/5 and holding for 10 seconds so she does not soil her underwear or pad 50% of the time.   ? Time 12   ? Period Weeks   ? Status New   ? Target Date 07/25/21   ?  ? PT LONG TERM GOAL #8  ? Title understands how to use the anal muscle stimulator to work on anal strength to reduce weakness   ? Time 12   ? Period Weeks   ? Status New   ? Target Date 07/25/21   ? ?  ?  ? ?  ? ? ? ? ? ? ? ? Plan - 05/15/21 0916   ? ? Clinical Impression Statement The patient is able to perform a continuation of core strengthening in standing, seated and supine with fewer rest breaks and slightly more repetitions.  She has peripheral symptoms even while sitting performing upper quadrant ex but she feels able to continue with exercise.    Therapist monitoring response and modifying to ensure a graded exposure to exercise and not "overdoing it" leading to an exacerbation of symptoms.   ? Personal Factors and Comorbidities Comorbidity 1;Profession;Comorbidity 2;Comorbidity 3+;Past/Current Experience;Time since onset of injury/illness/exacerbation   ? 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 Examination-Participation Restrictions Meal Prep;Cleaning;Occupation;Community Activity;Driving;Laundry;Medication Management   ? Rehab Potential Good   ? PT Frequency 1x / week   ? PT Duration 12 weeks   ? PT Treatment/Interventions ADLs/Self  Care Home Management;Biofeedback;Therapeutic activities;Therapeutic exercise;Neuromuscular re-education;Patient/family education;Manual techniques;Electrical Stimulation   ? PT Next Visit Plan low level supine, seated, standing core strengthening; short  duration aerobic ex; check remaining STGs next week   ? PT Home Exercise Plan Access Code: [email or web address]   ? ?  ?  ? ?  ? ? ?Patient will benefit from skilled therapeutic intervention in order to improve the following deficits and impairments:  Decreased coordination, Decreased strength, Decreased activity tolerance, Decreased endurance ? ?Visit Diagnosis: ?Muscle weakness (generalized) ? ?Other lack of coordination ? ?Chronic bilateral low back pain, unspecified whether sciatica present ? ? ? ? ?Problem List ?Patient Active Problem List  ? Diagnosis Date Noted  ? 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 Intractable low back pain 03/09/2020  ? Left hip pain 12/10/2018  ? Osteopenia 05/11/2018  ? Bursitis of hip 02/18/2017  ? 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 GERD (gastroesophageal reflux disease) 08/04/2014  ? Upper airway cough syndrome 08/04/2014  ? Chest discomfort 05/25/2014  ? ?Ruben Im, PT ?05/15/21 3:15 PM ?Phone: [phone] ?Fax: [phone]  ?Alvera Singh, PT ?05/15/2021, 3:14 PM ? ?[facility] ?[facility] ?[place], [place], [address] ?Phone: [phone]   Fax:  [phone] ? ?Name: Taylor Sharp ?MRN: [id number] ?Date of Birth: January 06, 1961 ? ? ? ?

## 2021-05-17 ENCOUNTER — Encounter: Payer: BC Managed Care – PPO | Admitting: Physical Therapy

## 2021-05-22 ENCOUNTER — Encounter: Payer: BC Managed Care – PPO | Admitting: Physical Therapy

## 2021-05-24 ENCOUNTER — Other Ambulatory Visit: Payer: Self-pay

## 2021-05-24 ENCOUNTER — Ambulatory Visit: Payer: BC Managed Care – PPO | Admitting: Physical Therapy

## 2021-05-24 DIAGNOSIS — G8929 Other chronic pain: Secondary | ICD-10-CM | POA: Diagnosis not present

## 2021-05-24 DIAGNOSIS — M6281 Muscle weakness (generalized): Secondary | ICD-10-CM | POA: Diagnosis not present

## 2021-05-24 DIAGNOSIS — R278 Other lack of coordination: Secondary | ICD-10-CM | POA: Diagnosis not present

## 2021-05-24 DIAGNOSIS — M545 Low back pain, unspecified: Secondary | ICD-10-CM | POA: Diagnosis not present

## 2021-05-24 NOTE — Therapy (Signed)
[facility] ?[facility] ?[place], [place], [address] ?Phone: [phone]   Fax:  [phone] ? ?Physical Therapy Treatment ? ?Patient Details  ?Name: Taylor Sharp ?MRN: [id number] ?Date of Birth: Nov 02, 1960 ?Referring Provider (PT): Dr. Sharyon Cable ? ? ?Encounter Date: 05/24/2021 ? ? PT End of Session - 05/24/21 1151   ? ? Visit Number 9   ? Number of Visits 30   ? Date for PT Re-Evaluation 07/25/21   ? Authorization Type BCBS 30 visit limit  today 8 total/30 including pelvic floor   ? Authorization - Visit Number 10   ? Authorization - Number of Visits 30   ? PT Start Time 1147   ? PT Stop Time 1225   ? PT Time Calculation (min) 38 min   ? Activity Tolerance Patient limited by pain   ? ?  ?  ? ?  ? ? ?Past Medical History:  ?Diagnosis Date  ? Asthma   ? ? ?Past Surgical History:  ?Procedure Laterality Date  ? ABLATION    ? IR ANGIO/SPINAL LEFT  03/11/2020  ? IR ANGIO/SPINAL LEFT  03/11/2020  ? IR ANGIO/SPINAL LEFT  03/11/2020  ? IR ANGIO/SPINAL LEFT  03/11/2020  ? IR ANGIO/SPINAL LEFT  03/11/2020  ? IR ANGIO/SPINAL LEFT  03/11/2020  ? IR ANGIO/SPINAL LEFT  03/11/2020  ? IR ANGIO/SPINAL RIGHT  03/11/2020  ? IR ANGIO/SPINAL RIGHT  03/11/2020  ? IR ANGIO/SPINAL RIGHT  03/11/2020  ? IR ANGIO/SPINAL RIGHT  03/11/2020  ? IR ANGIO/SPINAL RIGHT  03/11/2020  ? IR ANGIO/SPINAL RIGHT  03/11/2020  ? IR ANGIOGRAM SELECTIVE EACH ADDITIONAL VESSEL  03/11/2020  ? IR ANGIOGRAM SELECTIVE EACH ADDITIONAL VESSEL  03/11/2020  ? IR US GUIDE VASC ACCESS RIGHT  03/11/2020  ? RADIOLOGY WITH ANESTHESIA N/A 03/11/2020  ? Procedure: IR WITH ANESTHESIA;  Surgeon: Radiologist, Medication, MD;  Location: [facility];  Service: Radiology;  Laterality: N/A;  ? ? ?There were no vitals filed for this visit. ? ? Subjective Assessment - 05/24/21 1152   ? ? Subjective I overdid it in [place] this week and might need to go easier today.  Sore after last visit.   ? Pertinent History Gabapentin and Robaxin;  no current restrictions;   typically hypermobile; left hip bursitis; osteopenia   ? How long can you walk comfortably? If my husband is with me 1-2 miles holding onto him;  in cul-de-sac only by myself   ? 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 Patient Stated Goals get ready for daughter's wedding in June; reduce fecal continence   ? Currently in Pain? Yes   ? Pain Score 6    ? Pain Location Back   ? Pain Type Chronic pain   ? ?  ?  ? ?  ? ? ? ? ? ? ? ? ? ? ? ? ? ? ? ? ? ? ? ? [facility] Adult PT Treatment/Exercise - 05/24/21 0001   ? ?  ? Lumbar Exercises: Aerobic  ? UBE (Upper Arm Bike) 30 sec forward/30 backward   ? Nustep 3 min 30spm average   30 spm  ?  ? Lumbar Exercises: Machines for Strengthening  ? Other Lumbar Machine Exercise standing lat bar 25# 8 reps   ?  ? Lumbar Exercises: Seated  ? Other Seated Lumbar Exercises seated cable row 10# 6x   ? Other Seated Lumbar Exercises foam roll push down 7x 5 sec holds   ?  ?  Lumbar Exercises: Supine  ? AB Set Limitations 2 bil 2# curls 5x and triceps extensions 2# 5x each arm   ? Other Supine Lumbar Exercises bil 2# bil arm press 5x   ? Other Supine Lumbar Exercises static hold 2# while march 5x each side   ? ?  ?  ? ?  ? ? ? ? ? ? ? ? ? ? ? ? PT Short Term Goals - 05/24/21 1235   ? ?  ? PT SHORT TERM GOAL #1  ? Title The patient will be able to initiate a basic HEP for core and LE strengthening (especially) right LE to facilitate return to ADLs   ? Status Achieved   ?  ? PT SHORT TERM GOAL #2  ? Title The patient will be able to tolerate 30 min of activity supine, seated and standing   ? Status Achieved   ?  ? PT SHORT TERM GOAL #3  ? Title The patient have improved FOTO score from 34% to 42%   ? Time 6   ? Period Weeks   ? Status On-going   ?  ? PT SHORT TERM GOAL #4  ? Title The patient will be able to walk 10 minutes unassisted   ? Time 6   ? Period Weeks   ? Status On-going   ? ?  ?  ? ?  ? ? ? ? PT Long Term Goals - 05/02/21 1118   ? ?  ? Additional Long Term  Goals  ? Additional Long Term Goals Yes   ?  ? PT LONG TERM GOAL #6  ? Title Patient is able to have the urge to have a bowel movement and able to walk to the bathroom with minimal to no fecal leakage 50% of the time   ? Time 12   ? Period Weeks   ? Status New   ? Target Date 07/25/21   ?  ? PT LONG TERM GOAL #7  ? Title Anal strength >/= 3/5 and holding for 10 seconds so she does not soil her underwear or pad 50% of the time.   ? Time 12   ? Period Weeks   ? Status New   ? Target Date 07/25/21   ?  ? PT LONG TERM GOAL #8  ? Title understands how to use the anal muscle stimulator to work on anal strength to reduce weakness   ? Time 12   ? Period Weeks   ? Status New   ? Target Date 07/25/21   ? ?  ?  ? ?  ? ? ? ? ? ? ? ? Plan - 05/24/21 1224   ? ? Clinical Impression Statement The patient is able to perform a scaled down version of exercises secondary to reports of overdoing it this week with travel to [place].  She is able to perform all of the standing, seated and supine ex's but with fewer reps or lighter resistance.  Therapist monitoring response and modifying treatment accordingly.   ? 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 Examination-Activity Limitations Continence;Toileting;Locomotion Level   ? Examination-Participation Restrictions Meal Prep;Cleaning;Occupation;Community Activity;Driving;Laundry;Medication Management   ? Rehab Potential Good   ? PT Frequency 1x / week   ? PT Duration 12 weeks   ? PT Treatment/Interventions ADLs/Self Care Home Management;Biofeedback;Therapeutic activities;Therapeutic exercise;Neuromuscular re-education;Patient/family education;Manual techniques;Electrical Stimulation   ? PT Next Visit Plan do FOTO;  check walking  tolerance;  low level supine, seated, standing core strengthening; short duration aerobic ex; check remaining STGs next visit   ? PT Home Exercise Plan Access Code: [email or web address]   ? ?  ?  ? ?   ? ? ?Patient will benefit from skilled therapeutic intervention in order to improve the following deficits and impairments:  Decreased coordination, Decreased strength, Decreased activity tolerance, Decreased endurance ? ?Visit Diagnosis: ?Muscle weakness (generalized) ? ?Other lack of coordination ? ?Chronic bilateral low back pain, unspecified whether sciatica present ? ? ? ? ?Problem List ?Patient Active Problem List  ? Diagnosis Date Noted  ? 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 Intractable low back pain 03/09/2020  ? Left hip pain 12/10/2018  ? Osteopenia 05/11/2018  ? Bursitis of hip 02/18/2017  ? 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 GERD (gastroesophageal reflux disease) 08/04/2014  ? Upper airway cough syndrome 08/04/2014  ? Chest discomfort 05/25/2014  ? ?Ruben Im, PT ?05/24/21 12:36 PM ?Phone: [phone] ?Fax: [phone]  ?Alvera Singh, PT ?05/24/2021, 12:36 PM ? ?[facility] [place] [facility] ?[facility] ?[place], [place], [address] ?Phone: [phone]   Fax:  [phone] ? ?Name: Taylor Sharp ?MRN: [id number] ?Date of Birth: 07-Oct-1960 ? ? ? ?

## 2021-05-29 ENCOUNTER — Other Ambulatory Visit: Payer: Self-pay

## 2021-05-29 ENCOUNTER — Ambulatory Visit: Payer: BC Managed Care – PPO | Admitting: Physical Therapy

## 2021-05-29 DIAGNOSIS — M6281 Muscle weakness (generalized): Secondary | ICD-10-CM

## 2021-05-29 DIAGNOSIS — R278 Other lack of coordination: Secondary | ICD-10-CM | POA: Diagnosis not present

## 2021-05-29 DIAGNOSIS — G8929 Other chronic pain: Secondary | ICD-10-CM | POA: Diagnosis not present

## 2021-05-29 DIAGNOSIS — M545 Low back pain, unspecified: Secondary | ICD-10-CM | POA: Diagnosis not present

## 2021-05-29 NOTE — Therapy (Signed)
[facility] ?[facility] ?[place], [place], [address] ?Phone: [phone]   Fax:  [phone] ? ?Physical Therapy Treatment ? ?Patient Details  ?Name: Taylor Sharp ?MRN: [id number] ?Date of Birth: Jan 08, 1961 ?Referring Provider (PT): Dr. Sharyon Cable ? ? ?Encounter Date: 05/29/2021 ? ? PT End of Session - 05/29/21 0846   ? ? Visit Number 10   ? Number of Visits 30   ? Authorization Type BCBS 30 visit limit  today 8 total/30 including pelvic floor   ? Authorization - Visit Number 11   ? Authorization - Number of Visits 30   ? PT Start Time [phone]   ? PT Stop Time 0925   ? PT Time Calculation (min) 39 min   ? Activity Tolerance Patient limited by pain   ? ?  ?  ? ?  ? ? ?Past Medical History:  ?Diagnosis Date  ? Asthma   ? ? ?Past Surgical History:  ?Procedure Laterality Date  ? ABLATION    ? IR ANGIO/SPINAL LEFT  03/11/2020  ? IR ANGIO/SPINAL LEFT  03/11/2020  ? IR ANGIO/SPINAL LEFT  03/11/2020  ? IR ANGIO/SPINAL LEFT  03/11/2020  ? IR ANGIO/SPINAL LEFT  03/11/2020  ? IR ANGIO/SPINAL LEFT  03/11/2020  ? IR ANGIO/SPINAL LEFT  03/11/2020  ? IR ANGIO/SPINAL RIGHT  03/11/2020  ? IR ANGIO/SPINAL RIGHT  03/11/2020  ? IR ANGIO/SPINAL RIGHT  03/11/2020  ? IR ANGIO/SPINAL RIGHT  03/11/2020  ? IR ANGIO/SPINAL RIGHT  03/11/2020  ? IR ANGIO/SPINAL RIGHT  03/11/2020  ? IR ANGIOGRAM SELECTIVE EACH ADDITIONAL VESSEL  03/11/2020  ? IR ANGIOGRAM SELECTIVE EACH ADDITIONAL VESSEL  03/11/2020  ? IR US GUIDE VASC ACCESS RIGHT  03/11/2020  ? RADIOLOGY WITH ANESTHESIA N/A 03/11/2020  ? Procedure: IR WITH ANESTHESIA;  Surgeon: Radiologist, Medication, MD;  Location: [facility];  Service: Radiology;  Laterality: N/A;  ? ? ?There were no vitals filed for this visit. ? ? Subjective Assessment - 05/29/21 0852   ? ? Subjective I went to a yoga class yesterday.  Did OK but had trouble going to sleep (had to take something).   ? Pertinent History Gabapentin and Robaxin;  no current restrictions;  typically hypermobile; left hip  bursitis; osteopenia   ? Currently in Pain? Yes   ? Pain Score 6    ? Pain Location Back   ? Pain Type Chronic pain   ? ?  ?  ? ?  ? ? ? ? ? [facility] PT Assessment - 05/29/21 0001   ? ?  ? Observation/Other Assessments  ? Focus on Therapeutic Outcomes (FOTO)  45%   ? ?  ?  ? ?  ? ? ? ? ? ? ? ? ? ? ? ? ? ? ? ? [facility] Adult PT Treatment/Exercise - 05/29/21 0001   ? ?  ? Lumbar Exercises: Aerobic  ? UBE (Upper Arm Bike) 30 sec forward/30 backward   ? Nustep 3 min 30spm average   30 spm  ?  ? Lumbar Exercises: Machines for Strengthening  ? Other Lumbar Machine Exercise standing lat bar 25# 8 reps   ?  ? Lumbar Exercises: Standing  ? Other Standing Lumbar Exercises triceps extension 10# 10x   ?  ? Lumbar Exercises: Seated  ? Other Seated Lumbar Exercises seated cable row 10# 6x   ?  ? Lumbar Exercises: Supine  ? AB Set Limitations 2 bil 2# curls 5x and triceps extensions 2# 5x each arm   ?  Other Supine Lumbar Exercises bil 2# bil arm press 5x   ? Other Supine Lumbar Exercises 2# triceps extensions 8x right/left   ? ?  ?  ? ?  ? ? ? ? ? ? ? ? ? ? ? ? PT Short Term Goals - 05/24/21 1235   ? ?  ? PT SHORT TERM GOAL #1  ? Title The patient will be able to initiate a basic HEP for core and LE strengthening (especially) right LE to facilitate return to ADLs   ? Status Achieved   ?  ? PT SHORT TERM GOAL #2  ? Title The patient will be able to tolerate 30 min of activity supine, seated and standing   ? Status Achieved   ?  ? PT SHORT TERM GOAL #3  ? Title The patient have improved FOTO score from 34% to 42%   ? Time 6   ? Period Weeks   ? Status On-going   ?  ? PT SHORT TERM GOAL #4  ? Title The patient will be able to walk 10 minutes unassisted   ? Time 6   ? Period Weeks   ? Status On-going   ? ?  ?  ? ?  ? ? ? ? PT Long Term Goals - 05/02/21 1118   ? ?  ? Additional Long Term Goals  ? Additional Long Term Goals Yes   ?  ? PT LONG TERM GOAL #6  ? Title Patient is able to have the urge to have a bowel movement and able to walk to the  bathroom with minimal to no fecal leakage 50% of the time   ? Time 12   ? Period Weeks   ? Status New   ? Target Date 07/25/21   ?  ? PT LONG TERM GOAL #7  ? Title Anal strength >/= 3/5 and holding for 10 seconds so she does not soil her underwear or pad 50% of the time.   ? Time 12   ? Period Weeks   ? Status New   ? Target Date 07/25/21   ?  ? PT LONG TERM GOAL #8  ? Title understands how to use the anal muscle stimulator to work on anal strength to reduce weakness   ? Time 12   ? Period Weeks   ? Status New   ? Target Date 07/25/21   ? ?  ?  ? ?  ? ? ? ? ? ? ? ? Plan - 05/29/21 0919   ? ? Clinical Impression Statement The patient has improved with FOTO score from 34% to 45%.  She continues to have "sparking" pain in LEs with ex's but overall is tolerant to low intensity ex's with limited repetitions, light weight and alternating positions so no excessive periods of time standing.  Anticipate slower recovery secondary to the need to perform graded ex's to avoid over-fatigue/exacerbation from too much activity.   ? Personal Factors and Comorbidities Comorbidity 1;Profession;Comorbidity 2;Comorbidity 3+;Past/Current Experience;Time since onset of injury/illness/exacerbation   ? 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 Examination-Activity Limitations Continence;Toileting;Locomotion Level   ? Examination-Participation Restrictions Meal Prep;Cleaning;Occupation;Community Activity;Driving;Laundry;Medication Management   ? Rehab Potential Good   ? PT Frequency 1x / week   ? PT Duration 12 weeks   ? PT Treatment/Interventions ADLs/Self Care Home Management;Biofeedback;Therapeutic activities;Therapeutic exercise;Neuromuscular re-education;Patient/family education;Manual techniques;Electrical Stimulation   ? PT Next Visit Plan check walking tolerance;  low level supine, seated, standing core strengthening; short duration aerobic ex   ? PT Home Exercise  Plan Access Code: [email or web address]   ? ?  ?  ? ?  ? ? ?Patient will benefit from skilled therapeutic intervention in order to improve the following deficits and impairments:  Decreased coordination, Decreased strength, Decreased activity tolerance, Decreased endurance ? ?Visit Diagnosis: ?Muscle weakness (generalized) ? ?Other lack of coordination ? ?Chronic bilateral low back pain, unspecified whether sciatica present ? ? ? ? ?Problem List ?Patient Active Problem List  ? Diagnosis Date Noted  ? 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 Intractable low back pain 03/09/2020  ? Left hip pain 12/10/2018  ? Osteopenia 05/11/2018  ? Bursitis of hip 02/18/2017  ? 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 GERD (gastroesophageal reflux disease) 08/04/2014  ? Upper airway cough syndrome 08/04/2014  ? Chest discomfort 05/25/2014  ? ? ?Alvera Singh, PT ?05/29/2021, 5:24 PM ? ?[facility] ?[facility] ?[place], [place], [address] ?Phone: [phone]   Fax:  [phone] ? ?Name: Taylor Sharp ?MRN: [id number] ?Date of Birth: 1960/03/14 ? ? ? ?

## 2021-05-29 NOTE — Therapy (Signed)
[facility] ?[facility] ?[place], [place], [address] ?Phone: [phone]   Fax:  [phone] ? ?Physical Therapy Treatment ? ?Patient Details  ?Name: Taylor Sharp ?MRN: [id number] ?Date of Birth: 04/18/1960 ?Referring Provider (PT): Dr. Sharyon Cable ? ? ?Encounter Date: 05/29/2021 ? ? PT End of Session - 05/29/21 0846   ? ? Visit Number 10   ? Number of Visits 30   ? Authorization Type BCBS 30 visit limit  today 8 total/30 including pelvic floor   ? Authorization - Visit Number 11   ? Authorization - Number of Visits 30   ? PT Start Time [phone]   ? PT Stop Time 0925   ? PT Time Calculation (min) 39 min   ? Activity Tolerance Patient limited by pain   ? ?  ?  ? ?  ? ? ?Past Medical History:  ?Diagnosis Date  ? Asthma   ? ? ?Past Surgical History:  ?Procedure Laterality Date  ? ABLATION    ? IR ANGIO/SPINAL LEFT  03/11/2020  ? IR ANGIO/SPINAL LEFT  03/11/2020  ? IR ANGIO/SPINAL LEFT  03/11/2020  ? IR ANGIO/SPINAL LEFT  03/11/2020  ? IR ANGIO/SPINAL LEFT  03/11/2020  ? IR ANGIO/SPINAL LEFT  03/11/2020  ? IR ANGIO/SPINAL LEFT  03/11/2020  ? IR ANGIO/SPINAL RIGHT  03/11/2020  ? IR ANGIO/SPINAL RIGHT  03/11/2020  ? IR ANGIO/SPINAL RIGHT  03/11/2020  ? IR ANGIO/SPINAL RIGHT  03/11/2020  ? IR ANGIO/SPINAL RIGHT  03/11/2020  ? IR ANGIO/SPINAL RIGHT  03/11/2020  ? IR ANGIOGRAM SELECTIVE EACH ADDITIONAL VESSEL  03/11/2020  ? IR ANGIOGRAM SELECTIVE EACH ADDITIONAL VESSEL  03/11/2020  ? IR US GUIDE VASC ACCESS RIGHT  03/11/2020  ? RADIOLOGY WITH ANESTHESIA N/A 03/11/2020  ? Procedure: IR WITH ANESTHESIA;  Surgeon: Radiologist, Medication, MD;  Location: [facility];  Service: Radiology;  Laterality: N/A;  ? ? ?There were no vitals filed for this visit. ? ? Subjective Assessment - 05/29/21 0852   ? ? Subjective I went to a yoga class yesterday.  Did OK but had trouble going to sleep (had to take something).   ? Pertinent History Gabapentin and Robaxin;  no current restrictions;  typically hypermobile; left hip  bursitis; osteopenia   ? Currently in Pain? Yes   ? Pain Score 6    ? Pain Location Back   ? Pain Type Chronic pain   ? ?  ?  ? ?  ? ? ? ? ? [facility] PT Assessment - 05/29/21 0001   ? ?  ? Observation/Other Assessments  ? Focus on Therapeutic Outcomes (FOTO)  45%   ? ?  ?  ? ?  ? ? ? ? ? ? ? ? ? ? ? ? ? ? ? ? [facility] Adult PT Treatment/Exercise - 05/29/21 0001   ? ?  ? Lumbar Exercises: Aerobic  ? UBE (Upper Arm Bike) 30 sec forward/30 backward   ? Nustep 3 min 30spm average   30 spm  ?  ? Lumbar Exercises: Machines for Strengthening  ? Other Lumbar Machine Exercise standing lat bar 25# 8 reps   ?  ? Lumbar Exercises: Standing  ? Other Standing Lumbar Exercises triceps extension 10# 10x   ?  ? Lumbar Exercises: Seated  ? Other Seated Lumbar Exercises seated cable row 10# 6x   ?  ? Lumbar Exercises: Supine  ? AB Set Limitations 2 bil 2# curls 5x and triceps extensions 2# 5x each arm   ?  Other Supine Lumbar Exercises bil 2# bil arm press 5x   ? Other Supine Lumbar Exercises 2# triceps extensions 8x right/left   ? ?  ?  ? ?  ? ? ? ? ? ? ? ? ? ? ? ? PT Short Term Goals - 05/29/21 1724   ? ?  ? PT SHORT TERM GOAL #1  ? Title The patient will be able to initiate a basic HEP for core and LE strengthening (especially) right LE to facilitate return to ADLs   ? Status Achieved   ?  ? PT SHORT TERM GOAL #2  ? Title The patient will be able to tolerate 30 min of activity supine, seated and standing   ? Status Achieved   ?  ? PT SHORT TERM GOAL #3  ? Title The patient have improved FOTO score from 34% to 42%   ? Status Achieved   ?  ? PT SHORT TERM GOAL #4  ? Title The patient will be able to walk 10 minutes unassisted   ? Status Achieved   ? ?  ?  ? ?  ? ? ? ? PT Long Term Goals - 05/02/21 1118   ? ?  ? Additional Long Term Goals  ? Additional Long Term Goals Yes   ?  ? PT LONG TERM GOAL #6  ? Title Patient is able to have the urge to have a bowel movement and able to walk to the bathroom with minimal to no fecal leakage 50% of the  time   ? Time 12   ? Period Weeks   ? Status New   ? Target Date 07/25/21   ?  ? PT LONG TERM GOAL #7  ? Title Anal strength >/= 3/5 and holding for 10 seconds so she does not soil her underwear or pad 50% of the time.   ? Time 12   ? Period Weeks   ? Status New   ? Target Date 07/25/21   ?  ? PT LONG TERM GOAL #8  ? Title understands how to use the anal muscle stimulator to work on anal strength to reduce weakness   ? Time 12   ? Period Weeks   ? Status New   ? Target Date 07/25/21   ? ?  ?  ? ?  ? ? ? ? ? ? ? ? Plan - 05/29/21 0919   ? ? Clinical Impression Statement The patient has improved with FOTO score from 34% to 45%.  She continues to have "sparking" pain in LEs with ex's but overall is tolerant to low intensity ex's with limited repetitions, light weight and alternating positions so no excessive periods of time standing.  Anticipate slower recovery secondary to the need to perform graded ex's to avoid over-fatigue/exacerbation from too much activity.   ? Personal Factors and Comorbidities Comorbidity 1;Profession;Comorbidity 2;Comorbidity 3+;Past/Current Experience;Time since onset of injury/illness/exacerbation   ? 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 Examination-Activity Limitations Continence;Toileting;Locomotion Level   ? Examination-Participation Restrictions Meal Prep;Cleaning;Occupation;Community Activity;Driving;Laundry;Medication Management   ? Rehab Potential Good   ? PT Frequency 1x / week   ? PT Duration 12 weeks   ? PT Treatment/Interventions ADLs/Self Care Home Management;Biofeedback;Therapeutic activities;Therapeutic exercise;Neuromuscular re-education;Patient/family education;Manual techniques;Electrical Stimulation   ? PT Next Visit Plan check walking tolerance;  low level supine, seated, standing core strengthening; short duration aerobic ex   ? PT Home Exercise Plan Access Code:  [email or web address]   ? ?  ?  ? ?   ? ? ?Patient will benefit from skilled therapeutic intervention in order to improve the following deficits and impairments:  Decreased coordination, Decreased strength, Decreased activity tolerance, Decreased endurance ? ?Visit Diagnosis: ?Muscle weakness (generalized) ? ?Other lack of coordination ? ?Chronic bilateral low back pain, unspecified whether sciatica present ? ? ? ? ?Problem List ?Patient Active Problem List  ? Diagnosis Date Noted  ? 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 Intractable low back pain 03/09/2020  ? Left hip pain 12/10/2018  ? Osteopenia 05/11/2018  ? Bursitis of hip 02/18/2017  ? 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 GERD (gastroesophageal reflux disease) 08/04/2014  ? Upper airway cough syndrome 08/04/2014  ? Chest discomfort 05/25/2014  ? ?Ruben Im, PT ?05/29/21 5:25 PM ?Phone: [phone] ?Fax: [phone]  ?Alvera Singh, PT ?05/29/2021, 5:25 PM ? ?[facility] ?[facility] ?[place], [place], [address] ?Phone: [phone]   Fax:  [phone] ? ?Name: Taylor Sharp ?MRN: [id number] ?Date of Birth: 05/08/1960 ? ? ? ?

## 2021-05-30 ENCOUNTER — Encounter: Payer: Self-pay | Admitting: Physical Therapy

## 2021-05-30 ENCOUNTER — Ambulatory Visit: Payer: BC Managed Care – PPO | Admitting: Physical Therapy

## 2021-05-30 DIAGNOSIS — M6281 Muscle weakness (generalized): Secondary | ICD-10-CM | POA: Diagnosis not present

## 2021-05-30 DIAGNOSIS — R278 Other lack of coordination: Secondary | ICD-10-CM | POA: Diagnosis not present

## 2021-05-30 NOTE — Therapy (Signed)
[facility] ?[facility] ?[place], [place], [address] ?Phone: [phone]   Fax:  [phone] ? ?Physical Therapy Treatment ? ?Patient Details  ?Name: Taylor Sharp ?MRN: [id number] ?Date of Birth: Aug 20, 1960 ?Referring Provider (PT): Dr. Sharyon Cable ? ? ?Encounter Date: 05/30/2021 ? ? PT End of Session - 05/30/21 1019   ? ? Visit Number 11   ? Date for PT Re-Evaluation 07/25/21   ? Authorization Type BCBS 30 visit limit  today 8 total/30 including pelvic floor   ? Authorization - Visit Number 12   ? Authorization - Number of Visits 30   ? PT Start Time 1015   ? PT Stop Time 1055   ? PT Time Calculation (min) 40 min   ? Activity Tolerance Patient tolerated treatment well   ? Behavior During Therapy [facility] for tasks assessed/performed   ? ?  ?  ? ?  ? ? ?Past Medical History:  ?Diagnosis Date  ? Asthma   ? ? ?Past Surgical History:  ?Procedure Laterality Date  ? ABLATION    ? IR ANGIO/SPINAL LEFT  03/11/2020  ? IR ANGIO/SPINAL LEFT  03/11/2020  ? IR ANGIO/SPINAL LEFT  03/11/2020  ? IR ANGIO/SPINAL LEFT  03/11/2020  ? IR ANGIO/SPINAL LEFT  03/11/2020  ? IR ANGIO/SPINAL LEFT  03/11/2020  ? IR ANGIO/SPINAL LEFT  03/11/2020  ? IR ANGIO/SPINAL RIGHT  03/11/2020  ? IR ANGIO/SPINAL RIGHT  03/11/2020  ? IR ANGIO/SPINAL RIGHT  03/11/2020  ? IR ANGIO/SPINAL RIGHT  03/11/2020  ? IR ANGIO/SPINAL RIGHT  03/11/2020  ? IR ANGIO/SPINAL RIGHT  03/11/2020  ? IR ANGIOGRAM SELECTIVE EACH ADDITIONAL VESSEL  03/11/2020  ? IR ANGIOGRAM SELECTIVE EACH ADDITIONAL VESSEL  03/11/2020  ? IR US GUIDE VASC ACCESS RIGHT  03/11/2020  ? RADIOLOGY WITH ANESTHESIA N/A 03/11/2020  ? Procedure: IR WITH ANESTHESIA;  Surgeon: Radiologist, Medication, MD;  Location: [facility];  Service: Radiology;  Laterality: N/A;  ? ? ?There were no vitals filed for this visit. ? ? Subjective Assessment - 05/30/21 1020   ? ? Subjective I feel the pelvic floor is better. I have switched up my diet and medication. I am not taking the Lexapro and that is  helping. I am doing yoga. I have the stim machine.   ? Pertinent History Gabapentin and Robaxin;  no current restrictions;  typically hypermobile; left hip bursitis; osteopenia   ? Limitations House hold activities;Walking;Lifting   ? How long can you walk comfortably? If my husband is with me 1-2 miles holding onto him;  in cul-de-sac only by myself   ? 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 Patient Stated Goals get ready for daughter's wedding in June; reduce fecal continence   ? Currently in Pain? No/denies   ? ?  ?  ? ?  ? ? ? ? ? ? ? ? ? ? ? ? ? ? ? ? ? ? ? ? [facility] Adult PT Treatment/Exercise - 05/30/21 0001   ? ?  ? Self-Care  ? Self-Care Other Self-Care Comments   ? Other Self-Care Comments  educated patient on how to use the electrical stimulation for the vaginal canal until she gets the rectal probe to use anally.   ?  ? Neuro Re-ed   ? Neuro Re-ed Details  placed the vaginal probe into the vaginal canal. Put on program 1 with level 24 with contract the relax for 15 mintues. Edcuated patient on how  to increased and decrease the intensity,Educated patient on how to use the unit rectally so she can do at hoem.   ? ?  ?  ? ?  ? ? ? ? ? ? ? ? ? ? PT Education - 05/30/21 1057   ? ? Education Details educated patient on how to use the electrical stimulation for the vaginal and rectum   ? Person(s) Educated Patient   ? Methods Explanation;Demonstration   ? Comprehension Verbalized understanding;Returned demonstration   ? ?  ?  ? ?  ? ? ? PT Short Term Goals - 05/29/21 1724   ? ?  ? PT SHORT TERM GOAL #1  ? Title The patient will be able to initiate a basic HEP for core and LE strengthening (especially) right LE to facilitate return to ADLs   ? Status Achieved   ?  ? PT SHORT TERM GOAL #2  ? Title The patient will be able to tolerate 30 min of activity supine, seated and standing   ? Status Achieved   ?  ? PT SHORT TERM GOAL #3  ? Title The patient have improved FOTO score from 34% to  42%   ? Status Achieved   ?  ? PT SHORT TERM GOAL #4  ? Title The patient will be able to walk 10 minutes unassisted   ? Status Achieved   ? ?  ?  ? ?  ? ? ? ? PT Long Term Goals - 05/30/21 1101   ? ?  ? PT LONG TERM GOAL #6  ? Title Patient is able to have the urge to have a bowel movement and able to walk to the bathroom with minimal to no fecal leakage 50% of the time   ? Time 12   ? Period Weeks   ? Status On-going   ?  ? PT LONG TERM GOAL #7  ? Title Anal strength >/= 3/5 and holding for 10 seconds so she does not soil her underwear or pad 50% of the time.   ? Time 12   ? Period Weeks   ? Status On-going   ? Target Date 07/25/21   ?  ? PT LONG TERM GOAL #8  ? Title understands how to use the anal muscle stimulator to work on anal strength to reduce weakness   ? Time 12   ? Period Weeks   ? Status On-going   ? Target Date 07/25/21   ? ?  ?  ? ?  ? ? ? ? ? ? ? ? Plan - 05/30/21 1057   ? ? Clinical Impression Statement Patient understands how to use the electrical stimulation to the vagina and understands how to use it for the rectum when she gets the rectal probe. Patient was able to contract the pelvic floor a 24 hz and see the probe move. Patient will be doing this at home 2 times per day. Patient will continue with  [place] working on balance and strength and possible come to therapy to review using the probe and doing pelvic EMG to see how the muscles are improving.   ? Personal Factors and Comorbidities Comorbidity 1;Profession;Comorbidity 2;Comorbidity 3+;Past/Current Experience;Time since onset of injury/illness/exacerbation   ? 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 Examination-Activity Limitations Continence;Toileting;Locomotion Level   ? Examination-Participation Restrictions Meal Prep;Cleaning;Occupation;Community Activity;Driving;Laundry;Medication Management   ? Stability/Clinical Decision Making Evolving/Moderate complexity   ?  Rehab Potential Good   ?  PT Frequency 1x / week   ? PT Duration 12 weeks   ? PT Treatment/Interventions ADLs/Self Care Home Management;Biofeedback;Therapeutic activities;Therapeutic exercise;Neuromuscular re-education;Patient/family education;Manual techniques;Electrical Stimulation   ? PT Next Visit Plan check walking tolerance;  low level supine, seated, standing core strengthening; short duration aerobic ex; see if patient is able to use the rectal probe and if she just wants to save her appointments to see [facility].   ? PT Home Exercise Plan Access Code: [email or web address]   ? Consulted and Agree with Plan of Care Patient   ? ?  ?  ? ?  ? ? ?Patient will benefit from skilled therapeutic intervention in order to improve the following deficits and impairments:  Decreased coordination, Decreased strength, Decreased activity tolerance, Decreased endurance ? ?Visit Diagnosis: ?Muscle weakness (generalized) ? ?Other lack of coordination ? ? ? ? ?Problem List ?Patient Active Problem List  ? Diagnosis Date Noted  ? 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 Intractable low back pain 03/09/2020  ? Left hip pain 12/10/2018  ? Osteopenia 05/11/2018  ? Bursitis of hip 02/18/2017  ? 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 GERD (gastroesophageal reflux disease) 08/04/2014  ? Upper airway cough syndrome 08/04/2014  ? Chest discomfort 05/25/2014  ? ? ?Earlie Counts, PT ?05/30/21 11:02 AM ? ?[facility] ?[facility] ?[place], [place], [address] ?Phone: [phone]   Fax:  [phone] ? ?Name: Jaleesa Cervi ?MRN: [id number] ?Date of Birth: 08-10-1960 ? ? ? ?

## 2021-05-31 ENCOUNTER — Encounter: Payer: BC Managed Care – PPO | Admitting: Physical Therapy

## 2021-06-01 DIAGNOSIS — E538 Deficiency of other specified B group vitamins: Secondary | ICD-10-CM | POA: Diagnosis not present

## 2021-06-05 ENCOUNTER — Ambulatory Visit: Payer: BC Managed Care – PPO | Admitting: Physical Therapy

## 2021-06-05 ENCOUNTER — Other Ambulatory Visit: Payer: Self-pay

## 2021-06-05 DIAGNOSIS — M545 Low back pain, unspecified: Secondary | ICD-10-CM

## 2021-06-05 DIAGNOSIS — R278 Other lack of coordination: Secondary | ICD-10-CM | POA: Diagnosis not present

## 2021-06-05 DIAGNOSIS — G8929 Other chronic pain: Secondary | ICD-10-CM | POA: Diagnosis not present

## 2021-06-05 DIAGNOSIS — M6281 Muscle weakness (generalized): Secondary | ICD-10-CM | POA: Diagnosis not present

## 2021-06-05 NOTE — Therapy (Signed)
?[facility] ?[place], [place], [address] ?Phone: [phone]   Fax:  [phone] ? ?Physical Therapy Treatment ? ?Patient Details  ?Name: Taylor Sharp ?MRN: [id number] ?Date of Birth: [birth year] ?Referring Provider (PT): Dr. Sharyon Cable ? ? ?Encounter Date: 06/05/2021 ? ? PT End of Session - 06/05/21 0846   ? ? Visit Number 13   total combined with pelvic floor treatment  ? Number of Visits 30   ? Date for PT Re-Evaluation 07/25/21   ? Authorization Type BCBS 30 visit limit   including pelvic floor   ? Authorization - Visit Number 13   ? Authorization - Number of Visits 30   ? PT Start Time [phone]   ? PT Stop Time 0930   ? PT Time Calculation (min) 44 min   ? Activity Tolerance Patient tolerated treatment well;Patient limited by fatigue   ? ?  ?  ? ?  ? ? ?Past Medical History:  ?Diagnosis Date  ? Asthma   ? ? ?Past Surgical History:  ?Procedure Laterality Date  ? ABLATION    ? IR ANGIO/SPINAL LEFT  03/11/2020  ? IR ANGIO/SPINAL LEFT  03/11/2020  ? IR ANGIO/SPINAL LEFT  03/11/2020  ? IR ANGIO/SPINAL LEFT  03/11/2020  ? IR ANGIO/SPINAL LEFT  03/11/2020  ? IR ANGIO/SPINAL LEFT  03/11/2020  ? IR ANGIO/SPINAL LEFT  03/11/2020  ? IR ANGIO/SPINAL RIGHT  03/11/2020  ? IR ANGIO/SPINAL RIGHT  03/11/2020  ? IR ANGIO/SPINAL RIGHT  03/11/2020  ? IR ANGIO/SPINAL RIGHT  03/11/2020  ? IR ANGIO/SPINAL RIGHT  03/11/2020  ? IR ANGIO/SPINAL RIGHT  03/11/2020  ? IR ANGIOGRAM SELECTIVE EACH ADDITIONAL VESSEL  03/11/2020  ? IR ANGIOGRAM SELECTIVE EACH ADDITIONAL VESSEL  03/11/2020  ? IR US GUIDE VASC ACCESS RIGHT  03/11/2020  ? RADIOLOGY WITH ANESTHESIA N/A 03/11/2020  ? Procedure: IR WITH ANESTHESIA;  Surgeon: Radiologist, Medication, MD;  Location: [facility];  Service: Radiology;  Laterality: N/A;  ? ? ?There were no vitals filed for this visit. ? ? Subjective Assessment - 06/05/21 0851   ? ? Subjective I worked outside yesterday and might have overdone it.  Tired more than sore.   ? Pertinent History  Gabapentin and Robaxin;  no current restrictions;  typically hypermobile; left hip bursitis; osteopenia   ? How long can you walk comfortably? If my husband is with me 1-2 miles holding onto him;  in cul-de-sac only by myself   ? 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 Patient Stated Goals get ready for daughter's wedding in June; reduce fecal continence   ? Currently in Pain? Yes   ? Pain Score 6    ? Pain Location Back   ? Pain Orientation Lower   ? ?  ?  ? ?  ? ? ? ? ? ? ? ? ? ? ? ? ? ? ? ? ? ? ? ? [facility] Adult PT Treatment/Exercise - 06/05/21 0001   ? ?  ? Lumbar Exercises: Aerobic  ? UBE (Upper Arm Bike) 30 sec forward/30 backward   ? Nustep 3 min 30spm average   30 spm  ?  ? Lumbar Exercises: Machines for Strengthening  ? Other Lumbar Machine Exercise standing lat bar 25# 8 reps   ?  ? Lumbar Exercises: Standing  ? Other Standing Lumbar Exercises --   ? Other Standing Lumbar Exercises rocker board 1 min with some holds for stretching   ?  ?  Lumbar Exercises: Seated  ? Other Seated Lumbar Exercises seated cable row 10# 6x   isometric holds  ?  ? Lumbar Exercises: Supine  ? AB Set Limitations 2 bil 2# curls 5x and triceps extensions 2# 5x each arm   ? Other Supine Lumbar Exercises bil 2# bil arm press 5x   ? Other Supine Lumbar Exercises 2# triceps extensions 8x right/left   ? ?  ?  ? ?  ? ? ? ? ? ? ? ? ? ? ? ? PT Short Term Goals - 05/29/21 1724   ? ?  ? PT SHORT TERM GOAL #1  ? Title The patient will be able to initiate a basic HEP for core and LE strengthening (especially) right LE to facilitate return to ADLs   ? Status Achieved   ?  ? PT SHORT TERM GOAL #2  ? Title The patient will be able to tolerate 30 min of activity supine, seated and standing   ? Status Achieved   ?  ? PT SHORT TERM GOAL #3  ? Title The patient have improved FOTO score from 34% to 42%   ? Status Achieved   ?  ? PT SHORT TERM GOAL #4  ? Title The patient will be able to walk 10 minutes unassisted   ? Status  Achieved   ? ?  ?  ? ?  ? ? ? ? PT Long Term Goals - 05/30/21 1101   ? ?  ? PT LONG TERM GOAL #6  ? Title Patient is able to have the urge to have a bowel movement and able to walk to the bathroom with minimal to no fecal leakage 50% of the time   ? Time 12   ? Period Weeks   ? Status On-going   ?  ? PT LONG TERM GOAL #7  ? Title Anal strength >/= 3/5 and holding for 10 seconds so she does not soil her underwear or pad 50% of the time.   ? Time 12   ? Period Weeks   ? Status On-going   ? Target Date 07/25/21   ?  ? PT LONG TERM GOAL #8  ? Title understands how to use the anal muscle stimulator to work on anal strength to reduce weakness   ? Time 12   ? Period Weeks   ? Status On-going   ? Target Date 07/25/21   ? ?  ?  ? ?  ? ? ? ? ? ? ? ? Plan - 06/05/21 0932   ? ? Clinical Impression Statement The patient increased reports "sparking" sensation in legs following rocker board calf exercises but feels she needs to keep pushing despite the pain to avoid becoming too deconditioned.   She reports overdoing it in the yard yesterday.  Therapist ensuring graded exposure today to avoid overdoing it with low repetitions and low resistance.  Dizziness with transitional movements.   ? Personal Factors and Comorbidities Comorbidity 1;Profession;Comorbidity 2;Comorbidity 3+;Past/Current Experience;Time since onset of injury/illness/exacerbation   ? 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 Examination-Participation Restrictions Meal Prep;Cleaning;Occupation;Community Activity;Driving;Laundry;Medication Management   ? Rehab Potential Good   ? PT Frequency 1x / week   ? PT Duration 12 weeks   ? PT Treatment/Interventions ADLs/Self Care Home Management;Biofeedback;Therapeutic activities;Therapeutic exercise;Neuromuscular re-education;Patient/family education;Manual techniques;Electrical Stimulation   ? PT Next Visit Plan low level supine, seated, standing core  strengthening; short duration aerobic ex; see  if patient is able to use the rectal probe and if she just wants to save her appointments to see Santonio Speakman.   ? PT Home Exercise Plan Access Code: [email or web address]   ? ?  ?  ? ?  ? ? ?Patient will benefit from skilled therapeutic intervention in order to improve the following deficits and impairments:  Decreased coordination, Decreased strength, Decreased activity tolerance, Decreased endurance ? ?Visit Diagnosis: ?Muscle weakness (generalized) ? ?Other lack of coordination ? ?Chronic bilateral low back pain, unspecified whether sciatica present ? ? ? ? ?Problem List ?Patient Active Problem List  ? Diagnosis Date Noted  ? 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 A-V fistula ([facility]) 03/09/2020  ? Intractable low back pain 03/09/2020  ? Left hip pain 12/10/2018  ? Osteopenia 05/11/2018  ? Bursitis of hip 02/18/2017  ? 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 GERD (gastroesophageal reflux disease) 08/04/2014  ? Upper airway cough syndrome 08/04/2014  ? Chest discomfort 05/25/2014  ? ?Ruben Im, PT ?06/05/21 9:43 AM ?Phone: [phone] ?Fax: [phone]  ?Alvera Singh, PT ?06/05/2021, 9:43 AM ? ?[facility] ?[place], [place], [address] ?Phone: [phone]   Fax:  [phone] ? ?Name: Taylor Sharp ?MRN: [id number] ?Date of Birth: 25-Jul-1960 ? ? ? ?

## 2021-06-06 ENCOUNTER — Ambulatory Visit: Payer: BC Managed Care – PPO | Admitting: Physical Therapy

## 2021-06-06 ENCOUNTER — Encounter: Payer: Self-pay | Admitting: Physical Therapy

## 2021-06-06 DIAGNOSIS — M6281 Muscle weakness (generalized): Secondary | ICD-10-CM

## 2021-06-06 DIAGNOSIS — R278 Other lack of coordination: Secondary | ICD-10-CM

## 2021-06-06 NOTE — Therapy (Signed)
?[facility] ?[place], [place], [address] ?Phone: [phone]   Fax:  [phone] ? ?Physical Therapy Treatment ? ?Patient Details  ?Name: Taylor Sharp ?MRN: [id number] ?Date of Birth: 05-21-1960 ?Referring Provider (PT): Dr. Sharyon Cable ? ? ?Encounter Date: 06/06/2021 ? ? PT End of Session - 06/06/21 1106   ? ? Visit Number 14   ? Number of Visits 30   ? Date for PT Re-Evaluation 07/25/21   ? Authorization Type BCBS 30 visit limit   including pelvic floor   ? Authorization - Visit Number 14   ? Authorization - Number of Visits 30   ? PT Start Time 1100   ? PT Stop Time 1140   ? PT Time Calculation (min) 40 min   ? Activity Tolerance Patient tolerated treatment well;Patient limited by fatigue   ? Behavior During Therapy [facility] for tasks assessed/performed   ? ?  ?  ? ?  ? ? ?Past Medical History:  ?Diagnosis Date  ? Asthma   ? ? ?Past Surgical History:  ?Procedure Laterality Date  ? ABLATION    ? IR ANGIO/SPINAL LEFT  03/11/2020  ? IR ANGIO/SPINAL LEFT  03/11/2020  ? IR ANGIO/SPINAL LEFT  03/11/2020  ? IR ANGIO/SPINAL LEFT  03/11/2020  ? IR ANGIO/SPINAL LEFT  03/11/2020  ? IR ANGIO/SPINAL LEFT  03/11/2020  ? IR ANGIO/SPINAL LEFT  03/11/2020  ? IR ANGIO/SPINAL RIGHT  03/11/2020  ? IR ANGIO/SPINAL RIGHT  03/11/2020  ? IR ANGIO/SPINAL RIGHT  03/11/2020  ? IR ANGIO/SPINAL RIGHT  03/11/2020  ? IR ANGIO/SPINAL RIGHT  03/11/2020  ? IR ANGIO/SPINAL RIGHT  03/11/2020  ? IR ANGIOGRAM SELECTIVE EACH ADDITIONAL VESSEL  03/11/2020  ? IR ANGIOGRAM SELECTIVE EACH ADDITIONAL VESSEL  03/11/2020  ? IR US GUIDE VASC ACCESS RIGHT  03/11/2020  ? RADIOLOGY WITH ANESTHESIA N/A 03/11/2020  ? Procedure: IR WITH ANESTHESIA;  Surgeon: Radiologist, Medication, MD;  Location: [facility];  Service: Radiology;  Laterality: N/A;  ? ? ?There were no vitals filed for this visit. ? ? ? ? ? ? ? ? ? ? ? ? ? ? ? ? ? ? ? ? ? [facility] Adult PT Treatment/Exercise - 06/06/21 0001   ? ?  ? Self-Care  ? Self-Care Other Self-Care Comments    ? Other Self-Care Comments  eduction on how to insert the rectal probe into the anus for the electrical stimulation. Educated patient on what nerves innervate the anal and perineal area and how they affect the sensation and strength. Her cyst compresses at the level S1-S2.   ?  ? Neuro Re-ed   ? Neuro Re-ed Details  Therapist placed the anal probe into the aus and used the program 1. going to level 3. Patient was able to feel the electrical stimulation to feel the pelvic floor contraction. Used the electrical stimulation for 30 minutes.   ? ?  ?  ? ?  ? ? ? ? ? ? ? ? ? ? PT Education - 06/06/21 1130   ? ? Education Details educated patient on how to use the anal probe for the electrical stimulation.Educated patient on what nerves innervate the perineal and anal area   ? Person(s) Educated Patient   ? Methods Explanation   ? Comprehension Verbalized understanding   ? ?  ?  ? ?  ? ? ? PT Short Term Goals - 05/29/21 1724   ? ?  ? PT SHORT TERM GOAL #1  ?  Title The patient will be able to initiate a basic HEP for core and LE strengthening (especially) right LE to facilitate return to ADLs   ? Status Achieved   ?  ? PT SHORT TERM GOAL #2  ? Title The patient will be able to tolerate 30 min of activity supine, seated and standing   ? Status Achieved   ?  ? PT SHORT TERM GOAL #3  ? Title The patient have improved FOTO score from 34% to 42%   ? Status Achieved   ?  ? PT SHORT TERM GOAL #4  ? Title The patient will be able to walk 10 minutes unassisted   ? Status Achieved   ? ?  ?  ? ?  ? ? ? ? PT Long Term Goals - 06/06/21 1149   ? ?  ? PT LONG TERM GOAL #6  ? Title Patient is able to have the urge to have a bowel movement and able to walk to the bathroom with minimal to no fecal leakage 50% of the time   ? Baseline not yet but hoping the electrical stimulation will help   ? Time 12   ? Period Weeks   ? Status On-going   ? Target Date 07/25/21   ?  ? PT LONG TERM GOAL #7  ? Title Anal strength >/= 3/5 and holding for 10  seconds so she does not soil her underwear or pad 50% of the time.   ? Baseline hoping the electrical stimulation will help   ? Time 12   ? Period Weeks   ? Status On-going   ? Target Date 07/25/21   ?  ? PT LONG TERM GOAL #8  ? Title understands how to use the anal muscle stimulator to work on anal strength to reduce weakness   ? Time 12   ? Period Weeks   ? Status On-going   ? ?  ?  ? ?  ? ? ? ? ? ? ? ? Plan - 06/06/21 1131   ? ? Clinical Impression Statement Patient has not use the stimulation to the vagina since last weed due to not having the time from a busy schedule. Patient will be checked on to see if she is using the electrical stiumulation correctly. Patient at first was contracting the anus instead of using the gluteal but after using the stim she was able to isolate the contraction. She was feeling the lower abdominals contract with the stimulation. Educated patient on the nerves that connect to the anal area and why she has difficulty with contraction and the feeling in the vaginal area. Patient will benefit from skilled therapy to review the anal electrical stimulation.   ? Personal Factors and Comorbidities Comorbidity 1;Profession;Comorbidity 2;Comorbidity 3+;Past/Current Experience;Time since onset of injury/illness/exacerbation   ? 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 Examination-Participation Restrictions Meal Prep;Cleaning;Occupation;Community Activity;Driving;Laundry;Medication Management   ? Stability/Clinical Decision Making Evolving/Moderate complexity   ? Rehab Potential Good   ? PT Frequency 1x / week   ? PT Duration 12 weeks   ? PT Treatment/Interventions ADLs/Self Care Home Management;Biofeedback;Therapeutic activities;Therapeutic exercise;Neuromuscular re-education;Patient/family education;Manual techniques;Electrical Stimulation   ? PT Next Visit Plan low level supine, seated, standing core strengthening; short  duration aerobic ex; in 1 month see Malachy Mood for pelvic floor and review the pelvic floor electrical stimulation   ? PT Home Exercise Plan Access Code: [email or web address]   ? Consulted and  Agree with Plan of Care Patient   ? ?  ?  ? ?  ? ? ?Patient will benefit from skilled therapeutic intervention in order to improve the following deficits and impairments:  Decreased coordination, Decreased strength, Decreased activity tolerance, Decreased endurance ? ?Visit Diagnosis: ?Muscle weakness (generalized) ? ?Other lack of coordination ? ? ? ? ?Problem List ?Patient Active Problem List  ? Diagnosis Date Noted  ? 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 Intractable low back pain 03/09/2020  ? Left hip pain 12/10/2018  ? Osteopenia 05/11/2018  ? Bursitis of hip 02/18/2017  ? 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 GERD (gastroesophageal reflux disease) 08/04/2014  ? Upper airway cough syndrome 08/04/2014  ? Chest discomfort 05/25/2014  ? ? ?Earlie Counts, PT ?06/06/21 11:51 AM ? ?[facility] ?[place], [place], [address] ?Phone: [phone]   Fax:  [phone] ? ?Name: Taylor Sharp ?MRN: [id number] ?Date of Birth: [birth year] ? ? ? ?

## 2021-06-11 ENCOUNTER — Encounter: Payer: BC Managed Care – PPO | Admitting: Physical Therapy

## 2021-06-12 ENCOUNTER — Ambulatory Visit: Payer: BC Managed Care – PPO | Attending: Neurosurgery | Admitting: Physical Therapy

## 2021-06-12 DIAGNOSIS — M6281 Muscle weakness (generalized): Secondary | ICD-10-CM | POA: Diagnosis not present

## 2021-06-12 DIAGNOSIS — G8929 Other chronic pain: Secondary | ICD-10-CM | POA: Diagnosis not present

## 2021-06-12 DIAGNOSIS — M545 Low back pain, unspecified: Secondary | ICD-10-CM | POA: Insufficient documentation

## 2021-06-12 DIAGNOSIS — R278 Other lack of coordination: Secondary | ICD-10-CM | POA: Insufficient documentation

## 2021-06-12 NOTE — Therapy (Signed)
[facility] ?[facility] ?[place], [place], [address] ?Phone: [phone]   Fax:  [phone] ? ?Physical Therapy Treatment ? ?Patient Details  ?Name: Taylor Sharp ?MRN: [id number] ?Date of Birth: 11-Jan-1961 ?Referring Provider (PT): Dr. Sharyon Cable ? ? ?Encounter Date: 06/12/2021 ? ? PT End of Session - 06/12/21 1150   ? ? Visit Number 15   ? Number of Visits 30   ? Date for PT Re-Evaluation 07/25/21   ? Authorization Type BCBS 30 visit limit   including pelvic floor   ? Authorization - Visit Number 15   ? Authorization - Number of Visits 30   ? PT Start Time 1145   ? PT Stop Time 1223   ? PT Time Calculation (min) 38 min   ? Activity Tolerance Patient tolerated treatment well;Patient limited by fatigue   ? ?  ?  ? ?  ? ? ?Past Medical History:  ?Diagnosis Date  ? Asthma   ? ? ?Past Surgical History:  ?Procedure Laterality Date  ? ABLATION    ? IR ANGIO/SPINAL LEFT  03/11/2020  ? IR ANGIO/SPINAL LEFT  03/11/2020  ? IR ANGIO/SPINAL LEFT  03/11/2020  ? IR ANGIO/SPINAL LEFT  03/11/2020  ? IR ANGIO/SPINAL LEFT  03/11/2020  ? IR ANGIO/SPINAL LEFT  03/11/2020  ? IR ANGIO/SPINAL LEFT  03/11/2020  ? IR ANGIO/SPINAL RIGHT  03/11/2020  ? IR ANGIO/SPINAL RIGHT  03/11/2020  ? IR ANGIO/SPINAL RIGHT  03/11/2020  ? IR ANGIO/SPINAL RIGHT  03/11/2020  ? IR ANGIO/SPINAL RIGHT  03/11/2020  ? IR ANGIO/SPINAL RIGHT  03/11/2020  ? IR ANGIOGRAM SELECTIVE EACH ADDITIONAL VESSEL  03/11/2020  ? IR ANGIOGRAM SELECTIVE EACH ADDITIONAL VESSEL  03/11/2020  ? IR US GUIDE VASC ACCESS RIGHT  03/11/2020  ? RADIOLOGY WITH ANESTHESIA N/A 03/11/2020  ? Procedure: IR WITH ANESTHESIA;  Surgeon: Radiologist, Medication, MD;  Location: [facility];  Service: Radiology;  Laterality: N/A;  ? ? ?There were no vitals filed for this visit. ? ? Subjective Assessment - 06/12/21 1227   ? ? Subjective I was really sore from doing that rocker board.  I'm really tired today.   ? Pertinent History Gabapentin and Robaxin;  no current restrictions;   typically hypermobile; left hip bursitis; osteopenia   ? How long can you walk comfortably? If my husband is with me 1-2 miles holding onto him;  in cul-de-sac only by myself   ? 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 Patient Stated Goals get ready for daughter's wedding in June; reduce fecal continence   ? Currently in Pain? Yes   ? Pain Score 5    ? Pain Location Back   ? Pain Orientation Lower   ? Pain Type Chronic pain   ? ?  ?  ? ?  ? ? ? ? ? ? ? ? ? ? ? ? ? ? ? ? ? ? ? ? [facility] Adult PT Treatment/Exercise - 06/12/21 0001   ? ?  ? Lumbar Exercises: Aerobic  ? UBE (Upper Arm Bike) 30 sec forward/30 backward   ? Nustep 1.5 minutes   30 spm  ?  ? Lumbar Exercises: Machines for Strengthening  ? Other Lumbar Machine Exercise standing lat bar 25# 8 reps   ?  ? Lumbar Exercises: Standing  ? Other Standing Lumbar Exercises heel raises 6x   ?  ? Lumbar Exercises: Seated  ? Other Seated Lumbar Exercises seated cable row 10#  8x   isometric holds  ?  ? Lumbar Exercises: Supine  ? AB Set Limitations 2 bil 2# curls 10x and triceps extensions 2# 5x each arm   ? Advanced Lumbar Stabilization Limitations holding 2# at 90 degrees with marching 7x   ? Other Supine Lumbar Exercises bil 2# bil arm press 10x   ? Other Supine Lumbar Exercises 2# triceps extensions 8x right/left   ? ?  ?  ? ?  ? ? ? ? ? ? ? ? ? ? ? ? PT Short Term Goals - 05/29/21 1724   ? ?  ? PT SHORT TERM GOAL #1  ? Title The patient will be able to initiate a basic HEP for core and LE strengthening (especially) right LE to facilitate return to ADLs   ? Status Achieved   ?  ? PT SHORT TERM GOAL #2  ? Title The patient will be able to tolerate 30 min of activity supine, seated and standing   ? Status Achieved   ?  ? PT SHORT TERM GOAL #3  ? Title The patient have improved FOTO score from 34% to 42%   ? Status Achieved   ?  ? PT SHORT TERM GOAL #4  ? Title The patient will be able to walk 10 minutes unassisted   ? Status Achieved   ? ?  ?   ? ?  ? ? ? ? PT Long Term Goals - 06/06/21 1149   ? ?  ? PT LONG TERM GOAL #6  ? Title Patient is able to have the urge to have a bowel movement and able to walk to the bathroom with minimal to no fecal leakage 50% of the time   ? Baseline not yet but hoping the electrical stimulation will help   ? Time 12   ? Period Weeks   ? Status On-going   ? Target Date 07/25/21   ?  ? PT LONG TERM GOAL #7  ? Title Anal strength >/= 3/5 and holding for 10 seconds so she does not soil her underwear or pad 50% of the time.   ? Baseline hoping the electrical stimulation will help   ? Time 12   ? Period Weeks   ? Status On-going   ? Target Date 07/25/21   ?  ? PT LONG TERM GOAL #8  ? Title understands how to use the anal muscle stimulator to work on anal strength to reduce weakness   ? Time 12   ? Period Weeks   ? Status On-going   ? ?  ?  ? ?  ? ? ? ? ? ? ? ? Plan - 06/12/21 1228   ? ? Clinical Impression Statement The patient is able to participate in low level ex's.  Therapist very closely monitoring response to ensure graded exposure to ex's since symptoms very easily exacerbated.  Repetition number kept between 6-10 for all.  She reports some LE "sparking" but primary complaint is fatigue > pain.   ? Personal Factors and Comorbidities Comorbidity 1;Profession;Comorbidity 2;Comorbidity 3+;Past/Current Experience;Time since onset of injury/illness/exacerbation   ? 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 Examination-Activity Limitations Continence;Toileting;Locomotion Level   ? Stability/Clinical Decision Making Evolving/Moderate complexity   ? Rehab Potential Good   ? PT Frequency 1x / week   ? PT Duration 12 weeks   ? PT Treatment/Interventions ADLs/Self Care Home Management;Biofeedback;Therapeutic activities;Therapeutic exercise;Neuromuscular re-education;Patient/family education;Manual techniques;Electrical Stimulation   ?  PT Next Visit Plan low level  supine, seated, standing core strengthening; short duration aerobic ex; in 1 month see Malachy Mood for pelvic floor and review the pelvic floor electrical stimulation   ? PT Home Exercise Plan Access Code: [email or web address]   ? ?  ?  ? ?  ? ? ?Patient will benefit from skilled therapeutic intervention in order to improve the following deficits and impairments:  Decreased coordination, Decreased strength, Decreased activity tolerance, Decreased endurance ? ?Visit Diagnosis: ?Muscle weakness (generalized) ? ?Other lack of coordination ? ?Chronic bilateral low back pain, unspecified whether sciatica present ? ? ? ? ?Problem List ?Patient Active Problem List  ? Diagnosis Date Noted  ? 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 Intractable low back pain 03/09/2020  ? Left hip pain 12/10/2018  ? Osteopenia 05/11/2018  ? Bursitis of hip 02/18/2017  ? 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 GERD (gastroesophageal reflux disease) 08/04/2014  ? Upper airway cough syndrome 08/04/2014  ? Chest discomfort 05/25/2014  ? ?Ruben Im, PT ?06/12/21 1:59 PM ?Phone: [phone] ?Fax: [phone]  ?Alvera Singh, PT ?06/12/2021, 1:58 PM ? ?[facility] ?[place], [place], [address] ?Phone: [phone]   Fax:  [phone] ? ?Name: Taylor Sharp ?MRN: [id number] ?Date of Birth: 1960/08/01 ? ? ? ?

## 2021-06-18 ENCOUNTER — Encounter: Payer: BC Managed Care – PPO | Admitting: Physical Therapy

## 2021-06-25 ENCOUNTER — Encounter: Payer: BC Managed Care – PPO | Admitting: Physical Therapy

## 2021-06-26 ENCOUNTER — Ambulatory Visit: Payer: BC Managed Care – PPO | Admitting: Physical Therapy

## 2021-06-26 DIAGNOSIS — M6281 Muscle weakness (generalized): Secondary | ICD-10-CM | POA: Diagnosis not present

## 2021-06-26 DIAGNOSIS — G8929 Other chronic pain: Secondary | ICD-10-CM | POA: Diagnosis not present

## 2021-06-26 DIAGNOSIS — M545 Low back pain, unspecified: Secondary | ICD-10-CM

## 2021-06-26 DIAGNOSIS — R278 Other lack of coordination: Secondary | ICD-10-CM | POA: Diagnosis not present

## 2021-06-26 NOTE — Therapy (Signed)
[facility] ?[facility] ?[place], [place], [address] ?Phone: [phone]   Fax:  [phone] ? ?Physical Therapy Treatment ? ?Patient Details  ?Name: Taylor Sharp ?MRN: [id number] ?Date of Birth: 07-14-1960 ?Referring Provider (PT): Dr. Sharyon Cable ? ? ?Encounter Date: 06/26/2021 ? ? PT End of Session - 06/26/21 0853   ? ? Visit Number 16   ? Number of Visits 30   ? Date for PT Re-Evaluation 07/25/21   ? Authorization Type BCBS 30 visit limit   including pelvic floor   ? Authorization - Visit Number 16   ? Authorization - Number of Visits 30   ? PT Start Time 0848   ? PT Stop Time 0930   ? PT Time Calculation (min) 42 min   ? Activity Tolerance Patient tolerated treatment well;Patient limited by fatigue   ? ?  ?  ? ?  ? ? ?Past Medical History:  ?Diagnosis Date  ? Asthma   ? ? ?Past Surgical History:  ?Procedure Laterality Date  ? ABLATION    ? IR ANGIO/SPINAL LEFT  03/11/2020  ? IR ANGIO/SPINAL LEFT  03/11/2020  ? IR ANGIO/SPINAL LEFT  03/11/2020  ? IR ANGIO/SPINAL LEFT  03/11/2020  ? IR ANGIO/SPINAL LEFT  03/11/2020  ? IR ANGIO/SPINAL LEFT  03/11/2020  ? IR ANGIO/SPINAL LEFT  03/11/2020  ? IR ANGIO/SPINAL RIGHT  03/11/2020  ? IR ANGIO/SPINAL RIGHT  03/11/2020  ? IR ANGIO/SPINAL RIGHT  03/11/2020  ? IR ANGIO/SPINAL RIGHT  03/11/2020  ? IR ANGIO/SPINAL RIGHT  03/11/2020  ? IR ANGIO/SPINAL RIGHT  03/11/2020  ? IR ANGIOGRAM SELECTIVE EACH ADDITIONAL VESSEL  03/11/2020  ? IR ANGIOGRAM SELECTIVE EACH ADDITIONAL VESSEL  03/11/2020  ? IR US GUIDE VASC ACCESS RIGHT  03/11/2020  ? RADIOLOGY WITH ANESTHESIA N/A 03/11/2020  ? Procedure: IR WITH ANESTHESIA;  Surgeon: Radiologist, Medication, MD;  Location: [facility];  Service: Radiology;  Laterality: N/A;  ? ? ?There were no vitals filed for this visit. ? ? Subjective Assessment - 06/26/21 0855   ? ? Subjective It's a busy time.   There's a lot going on.  I overdid it doing yardwork.   ? Pertinent History Gabapentin and Robaxin;  no current restrictions;   typically hypermobile; left hip bursitis; osteopenia   ? Limitations House hold activities;Walking;Lifting   ? How long can you walk comfortably? If my husband is with me 1-2 miles holding onto him;  in cul-de-sac only by myself   ? Currently in Pain? Yes   ? Pain Score 5  in low back  ? ?  ?  ? ?  ? ? ? ? ? ? ? ? ? ? ? ? ? ? ? ? ? ? ? ? [facility] Adult PT Treatment/Exercise - 06/26/21 0001   ? ?  ? Lumbar Exercises: Aerobic  ? UBE (Upper Arm Bike) 30 sec forward/30 backward   ? Nustep 3:18 minutes   30 spm  ?  ? Lumbar Exercises: Machines for Strengthening  ? Other Lumbar Machine Exercise standing lat bar 25# 10 reps   ?  ? Lumbar Exercises: Standing  ? Other Standing Lumbar Exercises heel raises 6x   ? Other Standing Lumbar Exercises triceps 10# 8x   ?  ? Lumbar Exercises: Seated  ? Other Seated Lumbar Exercises seated cable row 10# 10x   isometric holds  ?  ? Lumbar Exercises: Supine  ? AB Set Limitations 2 bil 2# curls 10x and triceps extensions 2#  5x each arm   ? Advanced Lumbar Stabilization Limitations holding 2# at 90 degrees with marching 7x   ? Other Supine Lumbar Exercises bil 2# bil arm press 10x   ? Other Supine Lumbar Exercises 2# triceps extensions 8x right/left   ? ?  ?  ? ?  ? ? ? ? ? ? ? ? ? ? ? ? PT Short Term Goals - 05/29/21 1724   ? ?  ? PT SHORT TERM GOAL #1  ? Title The patient will be able to initiate a basic HEP for core and LE strengthening (especially) right LE to facilitate return to ADLs   ? Status Achieved   ?  ? PT SHORT TERM GOAL #2  ? Title The patient will be able to tolerate 30 min of activity supine, seated and standing   ? Status Achieved   ?  ? PT SHORT TERM GOAL #3  ? Title The patient have improved FOTO score from 34% to 42%   ? Status Achieved   ?  ? PT SHORT TERM GOAL #4  ? Title The patient will be able to walk 10 minutes unassisted   ? Status Achieved   ? ?  ?  ? ?  ? ? ? ? PT Long Term Goals - 06/06/21 1149   ? ?  ? PT LONG TERM GOAL #6  ? Title Patient is able to have the  urge to have a bowel movement and able to walk to the bathroom with minimal to no fecal leakage 50% of the time   ? Baseline not yet but hoping the electrical stimulation will help   ? Time 12   ? Period Weeks   ? Status On-going   ? Target Date 07/25/21   ?  ? PT LONG TERM GOAL #7  ? Title Anal strength >/= 3/5 and holding for 10 seconds so she does not soil her underwear or pad 50% of the time.   ? Baseline hoping the electrical stimulation will help   ? Time 12   ? Period Weeks   ? Status On-going   ? Target Date 07/25/21   ?  ? PT LONG TERM GOAL #8  ? Title understands how to use the anal muscle stimulator to work on anal strength to reduce weakness   ? Time 12   ? Period Weeks   ? Status On-going   ? ?  ?  ? ?  ? ? ? ? ? ? ? ? Plan - 06/26/21 0918   ? ? Clinical Impression Statement Low intensity ex's in supine, standing and sitting with limited reps performed.  She has some increase in low back and right LE pain but reports fatigue is the primary issue at the end of session.  Symptoms easily exacerbated so closely monitoring to encourage pacing and adequate rests between exercise.   ? 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 Examination-Activity Limitations Continence;Toileting;Locomotion Level   ? Examination-Participation Restrictions Meal Prep;Cleaning;Occupation;Community Activity;Driving;Laundry;Medication Management   ? Rehab Potential Good   ? PT Frequency 1x / week   ? PT Duration 12 weeks   ? PT Treatment/Interventions ADLs/Self Care Home Management;Biofeedback;Therapeutic activities;Therapeutic exercise;Neuromuscular re-education;Patient/family education;Manual techniques;Electrical Stimulation   ? PT Next Visit Plan low level supine, seated, standing core strengthening; short duration aerobic ex; in 1 month see Malachy Mood for pelvic floor and review the pelvic floor electrical stimulation   ? PT Home  Exercise Plan Access Code:  [email or web address]   ? ?  ?  ? ?  ? ? ?Patient will benefit from skilled therapeutic intervention in order to improve the following deficits and impairments:  Decreased coordination, Decreased strength, Decreased activity tolerance, Decreased endurance ? ?Visit Diagnosis: ?Muscle weakness (generalized) ? ?Other lack of coordination ? ?Chronic bilateral low back pain, unspecified whether sciatica present ? ? ? ? ?Problem List ?Patient Active Problem List  ? Diagnosis Date Noted  ? 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 A-V fistula ([place]) 03/09/2020  ? Intractable low back pain 03/09/2020  ? Left hip pain 12/10/2018  ? Osteopenia 05/11/2018  ? Bursitis of hip 02/18/2017  ? 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 GERD (gastroesophageal reflux disease) 08/04/2014  ? Upper airway cough syndrome 08/04/2014  ? Chest discomfort 05/25/2014  ? ?Ruben Im, PT ?06/26/21 5:23 PM ?Phone: [phone] ?Fax: [phone]  ?Alvera Singh, PT ?06/26/2021, 5:23 PM ? ?[facility] ?[facility] ?[place], [place], [address] ?Phone: [phone]   Fax:  [phone] ? ?Name: Taylor Sharp ?MRN: [id number] ?Date of Birth: 1960/04/25 ? ? ? ?

## 2021-07-02 ENCOUNTER — Ambulatory Visit: Payer: BC Managed Care – PPO | Attending: Registered Nurse | Admitting: Physical Therapy

## 2021-07-02 ENCOUNTER — Encounter: Payer: Self-pay | Admitting: Physical Therapy

## 2021-07-02 DIAGNOSIS — R278 Other lack of coordination: Secondary | ICD-10-CM | POA: Insufficient documentation

## 2021-07-02 DIAGNOSIS — M6281 Muscle weakness (generalized): Secondary | ICD-10-CM | POA: Insufficient documentation

## 2021-07-02 NOTE — Therapy (Signed)
[facility] ?[facility] ?[place], [place], [address] ?Phone: [phone]   Fax:  [phone] ? ?Physical Therapy Treatment ? ?Patient Details  ?Name: Taylor Sharp ?MRN: [id number] ?Date of Birth: [birth year] ?Referring Provider (PT): Dr. Sharyon Cable ? ? ?Encounter Date: 07/02/2021 ? ? PT End of Session - 07/02/21 0933   ? ? Visit Number 16   ? Number of Visits 30   ? Date for PT Re-Evaluation 07/25/21   ? Authorization Type BCBS 30 visit limit   including pelvic floor   ? Authorization - Visit Number 17   ? Authorization - Number of Visits 30   ? PT Start Time 0930   ? PT Stop Time 0940   ? PT Time Calculation (min) 10 min   ? Activity Tolerance Patient tolerated treatment well;Patient limited by fatigue   ? Behavior During Therapy [facility] for tasks assessed/performed   ? ?  ?  ? ?  ? ? ?Past Medical History:  ?Diagnosis Date  ? Asthma   ? ? ?Past Surgical History:  ?Procedure Laterality Date  ? ABLATION    ? IR ANGIO/SPINAL LEFT  03/11/2020  ? IR ANGIO/SPINAL LEFT  03/11/2020  ? IR ANGIO/SPINAL LEFT  03/11/2020  ? IR ANGIO/SPINAL LEFT  03/11/2020  ? IR ANGIO/SPINAL LEFT  03/11/2020  ? IR ANGIO/SPINAL LEFT  03/11/2020  ? IR ANGIO/SPINAL LEFT  03/11/2020  ? IR ANGIO/SPINAL RIGHT  03/11/2020  ? IR ANGIO/SPINAL RIGHT  03/11/2020  ? IR ANGIO/SPINAL RIGHT  03/11/2020  ? IR ANGIO/SPINAL RIGHT  03/11/2020  ? IR ANGIO/SPINAL RIGHT  03/11/2020  ? IR ANGIO/SPINAL RIGHT  03/11/2020  ? IR ANGIOGRAM SELECTIVE EACH ADDITIONAL VESSEL  03/11/2020  ? IR ANGIOGRAM SELECTIVE EACH ADDITIONAL VESSEL  03/11/2020  ? IR US GUIDE VASC ACCESS RIGHT  03/11/2020  ? RADIOLOGY WITH ANESTHESIA N/A 03/11/2020  ? Procedure: IR WITH ANESTHESIA;  Surgeon: Radiologist, Medication, MD;  Location: [facility];  Service: Radiology;  Laterality: N/A;  ? ? ?There were no vitals filed for this visit. ? ? Subjective Assessment - 07/02/21 0936   ? ? Subjective I have not been able to use the stimulation unit due to busy schedule.   ? Pertinent  History Gabapentin and Robaxin;  no current restrictions;  typically hypermobile; left hip bursitis; osteopenia   ? Limitations House hold activities;Walking;Lifting   ? How long can you walk comfortably? If my husband is with me 1-2 miles holding onto him;  in cul-de-sac only by myself   ? 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 Patient Stated Goals get ready for daughter's wedding in June; reduce fecal continence   ? Currently in Pain? No/denies   ? ?  ?  ? ?  ? ? ? ? ? ? ? ? ? ? ? ? ? ? ? ? ? ? ? ? ? ? ? ? ? ? ? ? ? ? ? PT Short Term Goals - 05/29/21 1724   ? ?  ? PT SHORT TERM GOAL #1  ? Title The patient will be able to initiate a basic HEP for core and LE strengthening (especially) right LE to facilitate return to ADLs   ? Status Achieved   ?  ? PT SHORT TERM GOAL #2  ? Title The patient will be able to tolerate 30 min of activity supine, seated and standing   ? Status Achieved   ?  ? PT SHORT TERM  GOAL #3  ? Title The patient have improved FOTO score from 34% to 42%   ? Status Achieved   ?  ? PT SHORT TERM GOAL #4  ? Title The patient will be able to walk 10 minutes unassisted   ? Status Achieved   ? ?  ?  ? ?  ? ? ? ? PT Long Term Goals - 06/06/21 1149   ? ?  ? PT LONG TERM GOAL #6  ? Title Patient is able to have the urge to have a bowel movement and able to walk to the bathroom with minimal to no fecal leakage 50% of the time   ? Baseline not yet but hoping the electrical stimulation will help   ? Time 12   ? Period Weeks   ? Status On-going   ? Target Date 07/25/21   ?  ? PT LONG TERM GOAL #7  ? Title Anal strength >/= 3/5 and holding for 10 seconds so she does not soil her underwear or pad 50% of the time.   ? Baseline hoping the electrical stimulation will help   ? Time 12   ? Period Weeks   ? Status On-going   ? Target Date 07/25/21   ?  ? PT LONG TERM GOAL #8  ? Title understands how to use the anal muscle stimulator to work on anal strength to reduce weakness   ? Time 12    ? Period Weeks   ? Status On-going   ? ?  ?  ? ?  ? ? ? ? ? ? ? ? Plan - 07/02/21 0945   ? ? Clinical Impression Statement Patient has not been able to use her stimulation unit at home so decided to have her come in July to work with therapy when she is able to do her home program. At this time she is planning her daughters wedding and having [place] so she is not able to dedicate time to the home program. After the wedding is over in July she will be able to dedicate time to her recovery and focus on her home program. Decided to not count this visit as a session so she will have more visits later due to 30 visit limit.   ? Personal Factors and Comorbidities Comorbidity 1;Profession;Comorbidity 2;Comorbidity 3+;Past/Current Experience;Time since onset of injury/illness/exacerbation   ? 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 Examination-Activity Limitations Continence;Toileting;Locomotion Level   ? Examination-Participation Restrictions Meal Prep;Cleaning;Occupation;Community Activity;Driving;Laundry;Medication Management   ? Stability/Clinical Decision Making Evolving/Moderate complexity   ? Rehab Potential Good   ? PT Frequency 1x / week   ? PT Duration 12 weeks   ? PT Treatment/Interventions ADLs/Self Care Home Management;Biofeedback;Therapeutic activities;Therapeutic exercise;Neuromuscular re-education;Patient/family education;Manual techniques;Electrical Stimulation   ? PT Next Visit Plan low level supine, seated, standing core strengthening; short duration aerobic ex; in 1 month see Malachy Mood for pelvic floor will reassess patient in July to see where she is and start on her progress due to being able to dedicate time to her program then.   ? PT Home Exercise Plan Access Code: [email or web address]   ? Consulted and Agree with Plan of Care Patient   ? ?  ?  ? ?  ? ? ?Patient will benefit from skilled therapeutic intervention in order to improve  the following deficits and impairments:  Decreased coordination, Decreased strength, Decreased activity tolerance, Decreased endurance ? ?Visit Diagnosis: ?Muscle weakness (  generalized) ? ?Other lack of coordination ? ? ? ? ?Problem List ?Patient Active Problem List  ? Diagnosis Date Noted  ? 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 Intractable low back pain 03/09/2020  ? Left hip pain 12/10/2018  ? Osteopenia 05/11/2018  ? Bursitis of hip 02/18/2017  ? 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 GERD (gastroesophageal reflux disease) 08/04/2014  ? Upper airway cough syndrome 08/04/2014  ? Chest discomfort 05/25/2014  ? ? ?Earlie Counts, PT ?07/02/21 9:50 AM ? ?[facility] ?[facility] ?[place], [place], [address] ?Phone: [phone]   Fax:  [phone] ? ?Name: Daja Shuping ?MRN: [id number] ?Date of Birth: 1960/11/08 ? ? ? ?

## 2021-07-03 ENCOUNTER — Ambulatory Visit: Payer: BC Managed Care – PPO | Admitting: Physical Therapy

## 2021-07-03 DIAGNOSIS — R278 Other lack of coordination: Secondary | ICD-10-CM | POA: Diagnosis not present

## 2021-07-03 DIAGNOSIS — G8929 Other chronic pain: Secondary | ICD-10-CM | POA: Diagnosis not present

## 2021-07-03 DIAGNOSIS — E538 Deficiency of other specified B group vitamins: Secondary | ICD-10-CM | POA: Diagnosis not present

## 2021-07-03 DIAGNOSIS — M545 Low back pain, unspecified: Secondary | ICD-10-CM | POA: Diagnosis not present

## 2021-07-03 DIAGNOSIS — M6281 Muscle weakness (generalized): Secondary | ICD-10-CM | POA: Diagnosis not present

## 2021-07-03 NOTE — Therapy (Signed)
[facility] ?[facility] ?[place], [place], [address] ?Phone: [phone]   Fax:  [phone] ? ?Physical Therapy Treatment ? ?Patient Details  ?Name: Taylor Sharp ?MRN: [id number] ?Date of Birth: [birth year] ?Referring Provider (PT): Dr. Sharyon Cable ? ? ?Encounter Date: 07/03/2021 ? ? PT End of Session - 07/03/21 1019   ? ? Visit Number 18   including pelvic floor treatment  ? Number of Visits 30   ? Date for PT Re-Evaluation 07/25/21   ? Authorization Type BCBS 30 visit limit   including pelvic floor   ? Authorization - Visit Number 18   ? Authorization - Number of Visits 30   ? PT Start Time 1016   ? PT Stop Time 1059   ? PT Time Calculation (min) 43 min   ? Activity Tolerance Patient tolerated treatment well;Patient limited by fatigue   ? ?  ?  ? ?  ? ? ?Past Medical History:  ?Diagnosis Date  ? Asthma   ? ? ?Past Surgical History:  ?Procedure Laterality Date  ? ABLATION    ? IR ANGIO/SPINAL LEFT  03/11/2020  ? IR ANGIO/SPINAL LEFT  03/11/2020  ? IR ANGIO/SPINAL LEFT  03/11/2020  ? IR ANGIO/SPINAL LEFT  03/11/2020  ? IR ANGIO/SPINAL LEFT  03/11/2020  ? IR ANGIO/SPINAL LEFT  03/11/2020  ? IR ANGIO/SPINAL LEFT  03/11/2020  ? IR ANGIO/SPINAL RIGHT  03/11/2020  ? IR ANGIO/SPINAL RIGHT  03/11/2020  ? IR ANGIO/SPINAL RIGHT  03/11/2020  ? IR ANGIO/SPINAL RIGHT  03/11/2020  ? IR ANGIO/SPINAL RIGHT  03/11/2020  ? IR ANGIO/SPINAL RIGHT  03/11/2020  ? IR ANGIOGRAM SELECTIVE EACH ADDITIONAL VESSEL  03/11/2020  ? IR ANGIOGRAM SELECTIVE EACH ADDITIONAL VESSEL  03/11/2020  ? IR US GUIDE VASC ACCESS RIGHT  03/11/2020  ? RADIOLOGY WITH ANESTHESIA N/A 03/11/2020  ? Procedure: IR WITH ANESTHESIA;  Surgeon: Radiologist, Medication, MD;  Location: [facility];  Service: Radiology;  Laterality: N/A;  ? ? ?There were no vitals filed for this visit. ? ? Subjective Assessment - 07/03/21 1028   ? ? Subjective I got a B shot this morning.  I'm really tired today.  Going to [place] this weekend (wedding).  I let my [employer]  membership go.   ? Pertinent History Gabapentin and Robaxin;  no current restrictions;  typically hypermobile; left hip bursitis; osteopenia   ? Patient Stated Goals get ready for daughter's wedding in June; reduce fecal continence   ? Currently in Pain? Yes   ? Pain Score 5    ? Pain Location Back   ? Pain Type Chronic pain   ? ?  ?  ? ?  ? ? ? ? ? ? ? ? ? ? ? ? ? ? ? ? ? ? ? ? [facility] Adult PT Treatment/Exercise - 07/03/21 0001   ? ?  ? Lumbar Exercises: Stretches  ? Single Knee to Chest Stretch Right;Left;2 reps   ?  ? Lumbar Exercises: Aerobic  ? UBE (Upper Arm Bike) 30 sec forward/30 backward   ? Nustep 2:40   ?  ? Lumbar Exercises: Machines for Strengthening  ? Leg Press 50# bil 5x2 last few isometrically   ? Other Lumbar Machine Exercise standing lat bar 25# 5 reps   ?  ? Lumbar Exercises: Standing  ? Other Standing Lumbar Exercises triceps 10# 8x   ?  ? Lumbar Exercises: Seated  ? Other Seated Lumbar Exercises seated cable row 10# 10x   isometric  holds  ?  ? Lumbar Exercises: Supine  ? AB Set Limitations bil 2# bicep curls 8x   ? Basic Lumbar Stabilization Limitations hand to knee push 5 sec to each side   ? Advanced Lumbar Stabilization Limitations holding 2# at 90 degrees with marching 7x   ? Other Supine Lumbar Exercises bil 2# bil arm press 10x   ? Other Supine Lumbar Exercises 2# triceps extensions 8x right/left   ? ?  ?  ? ?  ? ? ? ? ? ? ? ? ? ? ? ? PT Short Term Goals - 05/29/21 1724   ? ?  ? PT SHORT TERM GOAL #1  ? Title The patient will be able to initiate a basic HEP for core and LE strengthening (especially) right LE to facilitate return to ADLs   ? Status Achieved   ?  ? PT SHORT TERM GOAL #2  ? Title The patient will be able to tolerate 30 min of activity supine, seated and standing   ? Status Achieved   ?  ? PT SHORT TERM GOAL #3  ? Title The patient have improved FOTO score from 34% to 42%   ? Status Achieved   ?  ? PT SHORT TERM GOAL #4  ? Title The patient will be able to walk 10 minutes  unassisted   ? Status Achieved   ? ?  ?  ? ?  ? ? ? ? PT Long Term Goals - 06/06/21 1149   ? ?  ? PT LONG TERM GOAL #6  ? Title Patient is able to have the urge to have a bowel movement and able to walk to the bathroom with minimal to no fecal leakage 50% of the time   ? Baseline not yet but hoping the electrical stimulation will help   ? Time 12   ? Period Weeks   ? Status On-going   ? Target Date 07/25/21   ?  ? PT LONG TERM GOAL #7  ? Title Anal strength >/= 3/5 and holding for 10 seconds so she does not soil her underwear or pad 50% of the time.   ? Baseline hoping the electrical stimulation will help   ? Time 12   ? Period Weeks   ? Status On-going   ? Target Date 07/25/21   ?  ? PT LONG TERM GOAL #8  ? Title understands how to use the anal muscle stimulator to work on anal strength to reduce weakness   ? Time 12   ? Period Weeks   ? Status On-going   ? ?  ?  ? ?  ? ? ? ? ? ? ? ? Plan - 07/03/21 1044   ? ? Clinical Impression Statement Limited overall reps secondary to high fatigue levels today but able to initiate leg press today.  Modified some ex's to be isometric holds for comfort.  Therapist closely monitoring response and modifying often.  Discussed time duration of when results expected and comparison of status compared to 1 year ago when in rehab (mat exs only tolerated.)   ? 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 Examination-Participation Restrictions Meal Prep;Cleaning;Occupation;Community Activity;Driving;Laundry;Medication Management   ? Rehab Potential Good   ? PT Frequency 1x / week   ? PT Duration 12 weeks   ? PT Treatment/Interventions ADLs/Self Care Home Management;Biofeedback;Therapeutic activities;Therapeutic exercise;Neuromuscular re-education;Patient/family education;Manual techniques;Electrical Stimulation   ? PT Next Visit Plan  low level supine, seated, standing core strengthening; short duration aerobic ex; in 1  month see Malachy Mood for pelvic floor will reassess patient in July to see where she is and start on her progress due to being able to dedicate time to her program then.   ? PT Home Exercise Plan Access Code: [email or web address]   ? ?  ?  ? ?  ? ? ?Patient will benefit from skilled therapeutic intervention in order to improve the following deficits and impairments:  Decreased coordination, Decreased strength, Decreased activity tolerance, Decreased endurance ? ?Visit Diagnosis: ?Muscle weakness (generalized) ? ?Other lack of coordination ? ?Chronic bilateral low back pain, unspecified whether sciatica present ? ? ? ? ?Problem List ?Patient Active Problem List  ? Diagnosis Date Noted  ? 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 Intractable low back pain 03/09/2020  ? Left hip pain 12/10/2018  ? Osteopenia 05/11/2018  ? Bursitis of hip 02/18/2017  ? 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 GERD (gastroesophageal reflux disease) 08/04/2014  ? Upper airway cough syndrome 08/04/2014  ? Chest discomfort 05/25/2014  ? ? ?Alvera Singh, PT ?07/03/2021, 1:50 PM ? ?[facility] ?[facility] ?[place], [place], [address] ?Phone: [phone]   Fax:  [phone] ? ?Name: Taylor Sharp ?MRN: [id number] ?Date of Birth: August 28, 1960 ? ? ? ?

## 2021-07-17 ENCOUNTER — Ambulatory Visit: Payer: BC Managed Care – PPO | Attending: Neurosurgery | Admitting: Physical Therapy

## 2021-07-17 DIAGNOSIS — M6281 Muscle weakness (generalized): Secondary | ICD-10-CM | POA: Insufficient documentation

## 2021-07-17 DIAGNOSIS — M545 Low back pain, unspecified: Secondary | ICD-10-CM | POA: Insufficient documentation

## 2021-07-17 DIAGNOSIS — G8929 Other chronic pain: Secondary | ICD-10-CM | POA: Insufficient documentation

## 2021-07-17 DIAGNOSIS — R278 Other lack of coordination: Secondary | ICD-10-CM | POA: Insufficient documentation

## 2021-07-17 NOTE — Therapy (Signed)
[facility] ?[facility] ?[place], [place], [address] ?Phone: [phone]   Fax:  [phone] ? ?Physical Therapy Treatment ? ?Patient Details  ?Name: Taylor Sharp ?MRN: [id number] ?Date of Birth: [birth year] ?Referring Provider (PT): Dr. Sharyon Cable ? ? ?Encounter Date: 07/17/2021 ? ? PT End of Session - 07/17/21 1016   ? ? Visit Number 19   ? Number of Visits 30   ? Date for PT Re-Evaluation 07/25/21   ? Authorization Type BCBS 30 visit limit   including pelvic floor   ? Authorization - Visit Number 19   ? Authorization - Number of Visits 30   ? PT Start Time 1016   ? PT Stop Time 1058   ? PT Time Calculation (min) 42 min   ? Activity Tolerance Patient tolerated treatment well;Patient limited by fatigue   ? ?  ?  ? ?  ? ? ?Past Medical History:  ?Diagnosis Date  ? Asthma   ? ? ?Past Surgical History:  ?Procedure Laterality Date  ? ABLATION    ? IR ANGIO/SPINAL LEFT  03/11/2020  ? IR ANGIO/SPINAL LEFT  03/11/2020  ? IR ANGIO/SPINAL LEFT  03/11/2020  ? IR ANGIO/SPINAL LEFT  03/11/2020  ? IR ANGIO/SPINAL LEFT  03/11/2020  ? IR ANGIO/SPINAL LEFT  03/11/2020  ? IR ANGIO/SPINAL LEFT  03/11/2020  ? IR ANGIO/SPINAL RIGHT  03/11/2020  ? IR ANGIO/SPINAL RIGHT  03/11/2020  ? IR ANGIO/SPINAL RIGHT  03/11/2020  ? IR ANGIO/SPINAL RIGHT  03/11/2020  ? IR ANGIO/SPINAL RIGHT  03/11/2020  ? IR ANGIO/SPINAL RIGHT  03/11/2020  ? IR ANGIOGRAM SELECTIVE EACH ADDITIONAL VESSEL  03/11/2020  ? IR ANGIOGRAM SELECTIVE EACH ADDITIONAL VESSEL  03/11/2020  ? IR US GUIDE VASC ACCESS RIGHT  03/11/2020  ? RADIOLOGY WITH ANESTHESIA N/A 03/11/2020  ? Procedure: IR WITH ANESTHESIA;  Surgeon: Radiologist, Medication, MD;  Location: [facility];  Service: Radiology;  Laterality: N/A;  ? ? ?There were no vitals filed for this visit. ? ? Subjective Assessment - 07/17/21 1023   ? ? Subjective Tired from trip to [place] for pre-wedding events.  Being off anti-inflammatory diet upset system.   ? Pertinent History Gabapentin and Robaxin;  no  current restrictions;  typically hypermobile; left hip bursitis; osteopenia   ? Limitations House hold activities;Walking;Lifting   ? How long can you walk comfortably? If my husband is with me 1-2 miles holding onto him;  in cul-de-sac only by myself   ? 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 Currently in Pain? Yes   ? Pain Score 5    ? Aggravating Factors  lot of activity;  walking; yardwork   ? ?  ?  ? ?  ? ? ? ? ? ? ? ? ? ? ? ? ? ? ? ? ? ? ? ? [facility] Adult PT Treatment/Exercise - 07/17/21 0001   ? ?  ? Lumbar Exercises: Stretches  ? Single Knee to Chest Stretch Right;Left;2 reps   ?  ? Lumbar Exercises: Aerobic  ? UBE (Upper Arm Bike) --   ? Nustep 3:00   ?  ? Lumbar Exercises: Machines for Strengthening  ? Leg Press 55# bil 5x2 last few isometrically   ? Other Lumbar Machine Exercise standing lat bar 25# 5 reps   ?  ? Lumbar Exercises: Standing  ? Other Standing Lumbar Exercises triceps 10# 8x   ?  ? Lumbar Exercises: Seated  ? Other Seated  Lumbar Exercises seated cable row 10# 10x   isometric holds  ?  ? Lumbar Exercises: Supine  ? AB Set Limitations bil 2# bicep curls 8x   ? Basic Lumbar Stabilization Limitations hand to knee push 5 sec to each side with small ball and UE elevation   ? Advanced Lumbar Stabilization Limitations holding 2# at 90 degrees with marching 7x   ? Other Supine Lumbar Exercises bil 2# bil arm press 10x with ball squeeze between knees   ? Other Supine Lumbar Exercises 2# triceps extensions 8x right/left   ? ?  ?  ? ?  ? ? ? ? ? ? ? ? ? ? ? ? PT Short Term Goals - 05/29/21 1724   ? ?  ? PT SHORT TERM GOAL #1  ? Title The patient will be able to initiate a basic HEP for core and LE strengthening (especially) right LE to facilitate return to ADLs   ? Status Achieved   ?  ? PT SHORT TERM GOAL #2  ? Title The patient will be able to tolerate 30 min of activity supine, seated and standing   ? Status Achieved   ?  ? PT SHORT TERM GOAL #3  ? Title The patient have  improved FOTO score from 34% to 42%   ? Status Achieved   ?  ? PT SHORT TERM GOAL #4  ? Title The patient will be able to walk 10 minutes unassisted   ? Status Achieved   ? ?  ?  ? ?  ? ? ? ? PT Long Term Goals - 06/06/21 1149   ? ?  ? PT LONG TERM GOAL #6  ? Title Patient is able to have the urge to have a bowel movement and able to walk to the bathroom with minimal to no fecal leakage 50% of the time   ? Baseline not yet but hoping the electrical stimulation will help   ? Time 12   ? Period Weeks   ? Status On-going   ? Target Date 07/25/21   ?  ? PT LONG TERM GOAL #7  ? Title Anal strength >/= 3/5 and holding for 10 seconds so she does not soil her underwear or pad 50% of the time.   ? Baseline hoping the electrical stimulation will help   ? Time 12   ? Period Weeks   ? Status On-going   ? Target Date 07/25/21   ?  ? PT LONG TERM GOAL #8  ? Title understands how to use the anal muscle stimulator to work on anal strength to reduce weakness   ? Time 12   ? Period Weeks   ? Status On-going   ? ?  ?  ? ?  ? ? ? ? ? ? ? ? Plan - 07/17/21 1053   ? ? Clinical Impression Statement Able to continue with leg press and added number of reps to supine ex's.  LE symptoms activated with terminal knee extension and ankle dorsiflexion but calms with supine lying.  Therapist monitoring response and modifying as needed.   ? 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 Examination-Participation Restrictions Meal Prep;Cleaning;Occupation;Community Activity;Driving;Laundry;Medication Management   ? Rehab Potential Good   ? PT Frequency 1x / week   ? PT Duration 12 weeks   ? PT Treatment/Interventions ADLs/Self Care Home Management;Biofeedback;Therapeutic activities;Therapeutic exercise;Neuromuscular re-education;Patient/family education;Manual techniques;Electrical Stimulation   ? PT Next Visit  Plan low level supine, seated, standing core strengthening; short duration  aerobic ex; in 1 month see Malachy Mood for pelvic floor will reassess patient in July to see where she is and start on her progress due to being able to dedicate time to her program then.   ? PT Home Exercise Plan Access Code: [email or web address]   ? ?  ?  ? ?  ? ? ?Patient will benefit from skilled therapeutic intervention in order to improve the following deficits and impairments:  Decreased coordination, Decreased strength, Decreased activity tolerance, Decreased endurance ? ?Visit Diagnosis: ?Muscle weakness (generalized) ? ?Other lack of coordination ? ?Chronic bilateral low back pain, unspecified whether sciatica present ? ? ? ? ?Problem List ?Patient Active Problem List  ? Diagnosis Date Noted  ? 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 Intractable low back pain 03/09/2020  ? Left hip pain 12/10/2018  ? Osteopenia 05/11/2018  ? Bursitis of hip 02/18/2017  ? 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 GERD (gastroesophageal reflux disease) 08/04/2014  ? Upper airway cough syndrome 08/04/2014  ? Chest discomfort 05/25/2014  ? ?Ruben Im, PT ?07/17/21 5:13 PM ?Phone: [phone] ?Fax: [phone]  ?[facility] ?[facility] ?[place], [place], [address] ?Phone: [phone]   Fax:  [phone] ? ?Name: Taylor Sharp ?MRN: [id number] ?Date of Birth: [birth year] ? ? ? ?

## 2021-07-18 DIAGNOSIS — M5417 Radiculopathy, lumbosacral region: Secondary | ICD-10-CM | POA: Diagnosis not present

## 2021-07-23 DIAGNOSIS — Z01419 Encounter for gynecological examination (general) (routine) without abnormal findings: Secondary | ICD-10-CM | POA: Diagnosis not present

## 2021-07-23 DIAGNOSIS — Z681 Body mass index (BMI) 19 or less, adult: Secondary | ICD-10-CM | POA: Diagnosis not present

## 2021-08-07 ENCOUNTER — Ambulatory Visit: Payer: BC Managed Care – PPO | Admitting: Physical Therapy

## 2021-08-07 DIAGNOSIS — M6281 Muscle weakness (generalized): Secondary | ICD-10-CM | POA: Diagnosis not present

## 2021-08-07 DIAGNOSIS — R278 Other lack of coordination: Secondary | ICD-10-CM

## 2021-08-07 DIAGNOSIS — G8929 Other chronic pain: Secondary | ICD-10-CM | POA: Diagnosis not present

## 2021-08-07 DIAGNOSIS — M545 Low back pain, unspecified: Secondary | ICD-10-CM | POA: Diagnosis not present

## 2021-08-07 NOTE — Therapy (Addendum)
[facility] @ [facility], [place], [address] Phone: [phone]   Fax:  [phone]  Physical Therapy Treatment/Discharge Summary  Patient Details  Name: Taylor Sharp MRN: [id number] Date of Birth: 06-Jun-1960 Referring Provider (PT): Dr. Sharyon Cable  Dr. Cheryll Cockayne Encounter Date: 08/07/2021   PT End of Session - 08/07/21 1738     Visit Number 20    Number of Visits 30    Date for PT Re-Evaluation 11/27/21    Authorization Type BCBS 30 visit limit   including pelvic floor    Authorization - Visit Number 20    Authorization - Number of Visits 30    PT Start Time 1100    PT Stop Time 1144    PT Time Calculation (min) 44 min    Activity Tolerance Patient tolerated treatment well;Patient limited by fatigue             Past Medical History:  Diagnosis Date   Asthma     Past Surgical History:  Procedure Laterality Date   ABLATION     IR ANGIO/SPINAL LEFT  03/11/2020   IR ANGIO/SPINAL LEFT  03/11/2020   IR ANGIO/SPINAL LEFT  03/11/2020   IR ANGIO/SPINAL LEFT  03/11/2020   IR ANGIO/SPINAL LEFT  03/11/2020   IR ANGIO/SPINAL LEFT  03/11/2020   IR ANGIO/SPINAL LEFT  03/11/2020   IR ANGIO/SPINAL RIGHT  03/11/2020   IR ANGIO/SPINAL RIGHT  03/11/2020   IR ANGIO/SPINAL RIGHT  03/11/2020   IR ANGIO/SPINAL RIGHT  03/11/2020   IR ANGIO/SPINAL RIGHT  03/11/2020   IR ANGIO/SPINAL RIGHT  03/11/2020   IR ANGIOGRAM SELECTIVE EACH ADDITIONAL VESSEL  03/11/2020   IR ANGIOGRAM SELECTIVE EACH ADDITIONAL VESSEL  03/11/2020   IR US GUIDE VASC ACCESS RIGHT  03/11/2020   RADIOLOGY WITH ANESTHESIA N/A 03/11/2020   Procedure: IR WITH ANESTHESIA;  Surgeon: Radiologist, Medication, MD;  Location: [facility];  Service: Radiology;  Laterality: N/A;    There were no vitals filed for this visit.   Subjective Assessment - 08/07/21 1100     Subjective With my husband I can do the [employer] loop 1 mile.  By myself I can walk short distance 1/2 mile with the  dog.  Increased Gabapentin at nighttime and tried Cymbalta (not working well b/c of stomach issues).    Pertinent History Gabapentin and Robaxin;  no current restrictions;  typically hypermobile; left hip bursitis; osteopenia    How long can you walk comfortably? If my husband is with me 1-2 miles holding onto him;  in cul-de-sac only by myself    Patient Stated Goals get ready for daughter's wedding in June; reduce fecal continence    Currently in Pain? Yes    Pain Location Back                [facility] PT Assessment - 08/07/21 0001       Observation/Other Assessments   Focus on Therapeutic Outcomes (FOTO)  38%      Strength   Overall Strength Comments walks hands on thighs with sit to stand    Right Shoulder Flexion 4-/5    Right Shoulder Extension 4-/5    Right Shoulder ABduction 4-/5    Left Shoulder Flexion 4-/5    Left Shoulder Extension 4-/5    Left Shoulder ABduction 4-/5    Right Hip Flexion 3/5    Right Hip Extension 3-/5    Right Hip ABduction 3+/5    Right Hip ADduction  3-/5    Left Hip Flexion 3-/5    Left Hip Extension 3-/5    Left Hip ABduction 3/5    Left Hip ADduction 3-/5    Left Ankle Plantar Flexion 3+/5    Lumbar Flexion 3+/5                           [facility] Adult PT Treatment/Exercise - 08/07/21 0001       Self-Care   Other Self-Care Comments  extensive discussion on strategies for her daughter's wedding to avoid long periods of time standing (barstool); walking vs. standing in place      Lumbar Exercises: Machines for Strengthening   Leg Press 55# bil 5x2 last few isometrically    Other Lumbar Machine Exercise ankle PF and DF 5x on leg press    Other Lumbar Machine Exercise standing lat bar 20# 5 reps                       PT Short Term Goals - 08/07/21 1119       PT SHORT TERM GOAL #1   Title The patient will be able to initiate a basic HEP for core and LE strengthening (especially) right LE to facilitate return  to ADLs    Status Achieved      PT SHORT TERM GOAL #2   Title The patient will be able to tolerate 30 min of activity supine, seated and standing    Status Achieved      PT SHORT TERM GOAL #3   Title The patient have improved FOTO score from 34% to 42%    Status Achieved      PT SHORT TERM GOAL #4   Title The patient will be able to walk 10 minutes unassisted    Status Achieved               PT Long Term Goals - 08/07/21 1120       PT LONG TERM GOAL #1   Title The patient will be independent with safe self progression of HEP    Time 16    Period Weeks    Status On-going    Target Date 10/02/21      PT LONG TERM GOAL #2   Title The patient will tolerate 40 min of sitting and standing activity to prepare for her daughter's wedding    Status Partially Met      PT LONG TERM GOAL #3   Title The patient will have improved trunk, UE and LE strength to grossly  to 3+/5 and 4-/5 needed for walking, standing longer periods of time and curb/step negotiation and tolerate a big hug    Time 16    Period Weeks    Status Revised      PT LONG TERM GOAL #4   Title The patient will demonstrate improved activity tolerance for  walking 20 minutes    Status Achieved      PT LONG TERM GOAL #5   Title FOTO functional outcome score improved to 52%indicating improved function    Status On-going      PT LONG TERM GOAL #6   Title Patient is able to have the urge to have a bowel movement and able to walk to the bathroom with minimal to no fecal leakage 50% of the time  Plan - 08/07/21 1733     Clinical Impression Statement The patient has had numerous events to prepare for one daughter's graduation and another daughter's wedding in June.   Her fatigue level with standing and walking is limited but can handle longer periods of time with her husband's support.  She has improved in LE strength to at least 3/5.  Her trunk strength is impaired with difficulty  stabilizing when someone gives her a big hug.  Her pain levels have been moderate with LE paresthesia easily activated but overall tolerating light strengthening with low resistive levels and loads fairly well.  She would benefit from an additional 8-10 visits spread over 16 weeks for further trunk strengthening and over functional strengthening.    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    Examination-Participation Restrictions Meal Prep;Cleaning;Occupation;Community Activity;Driving;Laundry;Medication Management    Stability/Clinical Decision Making Evolving/Moderate complexity    Rehab Potential Good    PT Frequency 1x / week    PT Duration --   16 weeks   PT Treatment/Interventions ADLs/Self Care Home Management;Biofeedback;Therapeutic activities;Therapeutic exercise;Neuromuscular re-education;Patient/family education;Manual techniques;Electrical Stimulation    PT Next Visit Plan will send recert to Christie Beckers MD since [facility] PA has not signed previous certs;  low level supine, seated, standing core strengthening; try isometric ball pushes against wall for trunk strengthening;  short duration aerobic ex; in 1 month see Malachy Mood for pelvic floor will reassess patient in July to see where she is and start on her progress due to being able to dedicate time to her program then.    PT Home Exercise Plan Access Code: [email or web address]             Patient will benefit from skilled therapeutic intervention in order to improve the following deficits and impairments:  Decreased coordination, Decreased strength, Decreased activity tolerance, Decreased endurance  Visit Diagnosis: Muscle weakness (generalized) - Plan: PT plan of care cert/re-cert  Other lack of  coordination - Plan: PT plan of care cert/re-cert  Chronic bilateral low back pain, unspecified whether sciatica present - Plan: PT plan of care cert/re-cert     Problem List Patient Active Problem List   Diagnosis Date Noted   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 03/09/2020   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   Intractable low back pain 03/09/2020   Left hip pain 12/10/2018   Osteopenia 05/11/2018   Bursitis of hip 02/18/2017   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 Patient goals were partially met. Patient is being discharged due to not returning since the last visit.  Ruben Im, PT 12/12/21 9:08 AM Phone: [phone] Fax: [facility] @ [address] [place], [place], [address] Phone: [phone]   Fax:  [phone]  Name: Taylor Sharp MRN: [id number] Date of Birth: March 25, 1960

## 2021-08-09 DIAGNOSIS — E538 Deficiency of other specified B group vitamins: Secondary | ICD-10-CM | POA: Diagnosis not present

## 2021-08-30 ENCOUNTER — Ambulatory Visit: Payer: BC Managed Care – PPO | Admitting: Physical Therapy

## 2021-09-06 DIAGNOSIS — E538 Deficiency of other specified B group vitamins: Secondary | ICD-10-CM | POA: Diagnosis not present

## 2021-09-06 DIAGNOSIS — R197 Diarrhea, unspecified: Secondary | ICD-10-CM | POA: Diagnosis not present

## 2021-09-06 DIAGNOSIS — Z0000 Encounter for general adult medical examination without abnormal findings: Secondary | ICD-10-CM | POA: Diagnosis not present

## 2021-09-06 DIAGNOSIS — M5410 Radiculopathy, site unspecified: Secondary | ICD-10-CM | POA: Diagnosis not present

## 2021-09-06 DIAGNOSIS — Z13220 Encounter for screening for lipoid disorders: Secondary | ICD-10-CM | POA: Diagnosis not present

## 2021-09-06 DIAGNOSIS — F411 Generalized anxiety disorder: Secondary | ICD-10-CM | POA: Diagnosis not present

## 2021-09-17 DIAGNOSIS — R159 Full incontinence of feces: Secondary | ICD-10-CM | POA: Diagnosis not present

## 2021-09-17 DIAGNOSIS — Z8371 Family history of colonic polyps: Secondary | ICD-10-CM | POA: Diagnosis not present

## 2021-09-17 DIAGNOSIS — R152 Fecal urgency: Secondary | ICD-10-CM | POA: Diagnosis not present

## 2021-10-30 DIAGNOSIS — U071 COVID-19: Secondary | ICD-10-CM | POA: Diagnosis not present

## 2021-11-27 ENCOUNTER — Other Ambulatory Visit: Payer: Self-pay | Admitting: Neurosurgery

## 2021-11-27 DIAGNOSIS — D1800 Hemangioma unspecified site: Secondary | ICD-10-CM

## 2021-11-27 DIAGNOSIS — Z8673 Personal history of transient ischemic attack (TIA), and cerebral infarction without residual deficits: Secondary | ICD-10-CM

## 2021-12-04 ENCOUNTER — Encounter (HOSPITAL_COMMUNITY): Payer: Self-pay

## 2021-12-04 ENCOUNTER — Emergency Department (HOSPITAL_COMMUNITY)
Admission: EM | Admit: 2021-12-04 | Discharge: 2021-12-04 | Disposition: A | Discharge status: Home / Self Care | Payer: BC Managed Care – PPO | Attending: Emergency Medicine | Admitting: Emergency Medicine

## 2021-12-04 DIAGNOSIS — K6289 Other specified diseases of anus and rectum: Secondary | ICD-10-CM | POA: Diagnosis not present

## 2021-12-04 DIAGNOSIS — D122 Benign neoplasm of ascending colon: Secondary | ICD-10-CM | POA: Diagnosis not present

## 2021-12-04 DIAGNOSIS — J45909 Unspecified asthma, uncomplicated: Secondary | ICD-10-CM | POA: Diagnosis not present

## 2021-12-04 DIAGNOSIS — K625 Hemorrhage of anus and rectum: Secondary | ICD-10-CM | POA: Diagnosis not present

## 2021-12-04 DIAGNOSIS — D128 Benign neoplasm of rectum: Secondary | ICD-10-CM | POA: Diagnosis not present

## 2021-12-04 DIAGNOSIS — D125 Benign neoplasm of sigmoid colon: Secondary | ICD-10-CM | POA: Diagnosis not present

## 2021-12-04 DIAGNOSIS — R159 Full incontinence of feces: Secondary | ICD-10-CM | POA: Diagnosis not present

## 2021-12-04 DIAGNOSIS — K5730 Diverticulosis of large intestine without perforation or abscess without bleeding: Secondary | ICD-10-CM | POA: Diagnosis not present

## 2021-12-04 LAB — CBC WITH DIFFERENTIAL/PLATELET
Abs Immature Granulocytes: 0.01 10*3/uL (ref 0.00–0.07)
Basophils Absolute: 0 10*3/uL (ref 0.0–0.1)
Basophils Relative: 0 %
Eosinophils Absolute: 0.1 10*3/uL (ref 0.0–0.5)
Eosinophils Relative: 1 %
HCT: 40.6 % (ref 36.0–46.0)
Hemoglobin: 13.6 g/dL (ref 12.0–15.0)
Immature Granulocytes: 0 %
Lymphocytes Relative: 46 %
Lymphs Abs: 4.1 10*3/uL — ABNORMAL HIGH (ref 0.7–4.0)
MCH: 31.1 pg (ref 26.0–34.0)
MCHC: 33.5 g/dL (ref 30.0–36.0)
MCV: 92.7 fL (ref 80.0–100.0)
Monocytes Absolute: 0.7 10*3/uL (ref 0.1–1.0)
Monocytes Relative: 7 %
Neutro Abs: 4.2 10*3/uL (ref 1.7–7.7)
Neutrophils Relative %: 46 %
Platelets: 254 10*3/uL (ref 150–400)
RBC: 4.38 MIL/uL (ref 3.87–5.11)
RDW: 12.5 % (ref 11.5–15.5)
WBC: 9 10*3/uL (ref 4.0–10.5)
nRBC: 0 % (ref 0.0–0.2)

## 2021-12-04 LAB — COMPREHENSIVE METABOLIC PANEL
ALT: 14 U/L (ref 0–44)
AST: 18 U/L (ref 15–41)
Albumin: 3.9 g/dL (ref 3.5–5.0)
Alkaline Phosphatase: 47 U/L (ref 38–126)
Anion gap: 8 (ref 5–15)
BUN: 10 mg/dL (ref 6–20)
CO2: 21 mmol/L — ABNORMAL LOW (ref 22–32)
Calcium: 8.6 mg/dL — ABNORMAL LOW (ref 8.9–10.3)
Chloride: 109 mmol/L (ref 98–111)
Creatinine, Ser: 0.77 mg/dL (ref 0.44–1.00)
GFR, Estimated: >60 mL/min (ref >60)
Glucose, Bld: 135 mg/dL — ABNORMAL HIGH (ref 70–99)
Potassium: 3.8 mmol/L (ref 3.5–5.1)
Sodium: 138 mmol/L (ref 135–145)
Total Bilirubin: 1.3 mg/dL — ABNORMAL HIGH (ref 0.3–1.2)
Total Protein: 6.6 g/dL (ref 6.5–8.1)

## 2021-12-04 LAB — TYPE AND SCREEN
ABO/RH(D): O POS
Antibody Screen: NEGATIVE

## 2021-12-04 LAB — LIPASE, BLOOD: Lipase: 55 U/L — ABNORMAL HIGH (ref 11–51)

## 2021-12-04 NOTE — Discharge Instructions (Signed)
Dr. Therisa Doyne recommended applying Anusol to the anal area.  She will contact you to arrange close follow-up.  She states she still may pass some small clots of blood.  Return to the ED as needed for similar symptoms

## 2021-12-04 NOTE — ED Notes (Signed)
Pt reports active GI bleed s/p colonoscopy today @ noon. Denies Blood thinner usage. Patient placed on monitor. 2 Ivs inserted. Pt reports numbness to bilateral legs.   Baseline right sided weakness per patient

## 2021-12-04 NOTE — ED Provider Notes (Signed)
[facility] DEPT Provider Note   CSN: [id number] Arrival date & time: 12/04/21  1837     History  Chief Complaint  Patient presents with   Rectal Bleeding    Taylor Sharp is a 61 y.o. female.   Rectal Bleeding    Patient presents to the ED for evaluation of rectal bleeding.  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  Patient states she has a history of polyps and she had a colonoscopy today with polypectomy as well as biopsy to evaluate for incontinence..  This was performed by Dr. Therisa Doyne.  Patient states she started developing large amount of rectal bleeding since then.  She has had several episodes and passed clots.  She called her GI doctor and was instructed to come to the ED for further evaluation  Home Medications Prior to Admission medications   Medication Sig Start Date End Date Taking? Authorizing Provider  acetaminophen (TYLENOL) 500 MG tablet Take 1,000 mg by mouth in the morning, at noon, in the evening, and at bedtime.    [provider]  ALPRAZolam Duanne Moron) 0.25 MG tablet Take 0.25 mg by mouth 3 (three) times daily as needed for anxiety.    [provider]  HYDROmorphone (DILAUDID) 1 MG/ML injection Inject 1 mL (1 mg total) into the vein every 2 (two) hours as needed for severe pain. Patient not taking: Reported on 05/09/2020 03/11/20   Domenic Polite, MD  ondansetron [facility]) 4 MG/2ML SOLN injection Inject 2 mLs (4 mg total) into the vein every 6 (six) hours as needed for nausea or vomiting. Patient not taking: Reported on 05/09/2020 03/11/20   Domenic Polite, MD      Allergies    Codeine and Prednisone    Review of Systems   Review of Systems  Gastrointestinal:  Positive for hematochezia.    Physical Exam Updated Vital Signs BP 118/82   Pulse 64   Temp 98.1 F (36.7 C) (Oral)   Resp 16   SpO2 100%  Physical Exam Vitals and nursing  note reviewed.  Constitutional:      General: She is not in acute distress.    Appearance: She is well-developed.  HENT:     Head: Normocephalic and atraumatic.     Right Ear: External ear normal.     Left Ear: External ear normal.  Eyes:     General: No scleral icterus.       Right eye: No discharge.        Left eye: No discharge.     Conjunctiva/sclera: Conjunctivae normal.  Neck:     Trachea: No tracheal deviation.  Cardiovascular:     Rate and Rhythm: Normal rate and regular rhythm.  Pulmonary:     Effort: Pulmonary effort is normal. No respiratory distress.     Breath sounds: Normal breath sounds. No stridor. No wheezing or rales.  Abdominal:     General: Bowel sounds are normal. There is no distension.     Palpations: Abdomen is soft.     Tenderness: There is no abdominal tenderness. There is no guarding or rebound.  Genitourinary:    Comments: Blood noted on rectal exam, no large clots noted during exam Musculoskeletal:        General: No tenderness or deformity.     Cervical back: Neck supple.  Skin:    General: Skin is warm and dry.     Findings: No rash.  Neurological:  General: No focal deficit present.     Mental Status: She is alert.     Cranial Nerves: No cranial nerve deficit (no facial droop, extraocular movements intact, no slurred speech).     Sensory: No sensory deficit.     Motor: No abnormal muscle tone or seizure activity.     Coordination: Coordination normal.  Psychiatric:        Mood and Affect: Mood normal.     ED Results / Procedures / Treatments   Labs (all labs ordered are listed, but only abnormal results are displayed) Labs Reviewed  CBC WITH DIFFERENTIAL/PLATELET - Abnormal; Notable for the following components:      Result Value   Lymphs Abs 4.1 (*)    All other components within normal limits  COMPREHENSIVE METABOLIC PANEL - Abnormal; Notable for the following components:   CO2 21 (*)    Glucose, Bld 135 (*)    Calcium 8.6  (*)    Total Bilirubin 1.3 (*)    All other components within normal limits  LIPASE, BLOOD - Abnormal; Notable for the following components:   Lipase 55 (*)    All other components within normal limits  I-STAT CHEM 8, ED  TYPE AND SCREEN  ABO/RH    EKG None  Radiology No results found.  Procedures Procedures    Medications Ordered in ED Medications - No data to display  ED Course/ Medical Decision Making/ A&P Clinical Course as of 12/04/21 2057  Tue Dec 04, 2021  2013 CBC with Differential(!) Hemoglobin normal at [JK]  2013 Lipase, blood(!) Lipase elevated at 55 [JK]  2013 Comprehensive metabolic panel(!) Metabolic panel normal [JK]  2035 Discussed with Dr Therisa Doyne, the polyp was right around the hemorrhoid.  Pt does not need to be admitted.  Recommend anusol suppository.  Will likely still have some small amount of bleeding [JK]    Clinical Course User Index [JK] Dorie Rank, MD                           Medical Decision Making Problems Addressed: Rectal bleeding: acute illness or injury that poses a threat to life or bodily functions  Amount and/or Complexity of Data Reviewed Labs: ordered. Decision-making details documented in ED Course. Discussion of management or test interpretation with external provider(s): Discussed case with Dr. Therisa Doyne, gastroenterology.  With her normal blood count and no signs of active bleeding at this time she feels she is stable for discharge and close outpatient management.  Patient presented to the ED for evaluation of rectal bleeding after a colonoscopy procedure.  Was concerned about the possibility of severe hemorrhage associated with the procedure.  Fortunately the patient's blood count is normal.  Her vital signs have remained stable.  No recurrent rectal bleeding while she has been monitored in the ED.  Case discussed with GI.  Patient does not require admission to the hospital for observation.     Evaluation and diagnostic testing  in the emergency department does not suggest an emergent condition requiring admission or immediate intervention beyond what has been performed at this time.  The patient is safe for discharge and has been instructed to return immediately for worsening symptoms, change in symptoms or any other concerns.       Final Clinical Impression(s) / ED Diagnoses Final diagnoses:  Rectal bleeding    Rx / DC Orders ED Discharge Orders     None  Dorie Rank, MD 12/04/21 [phone]

## 2021-12-04 NOTE — ED Triage Notes (Signed)
Pt arrived via POV, rectal bleeding since coloscopy today. Has saturated more than 5 pads, still actively bleeding. No thinners.

## 2021-12-10 DIAGNOSIS — E538 Deficiency of other specified B group vitamins: Secondary | ICD-10-CM | POA: Diagnosis not present

## 2021-12-18 ENCOUNTER — Other Ambulatory Visit: Payer: BC Managed Care – PPO

## 2021-12-21 DIAGNOSIS — K529 Noninfective gastroenteritis and colitis, unspecified: Secondary | ICD-10-CM | POA: Diagnosis not present

## 2021-12-21 DIAGNOSIS — R140 Abdominal distension (gaseous): Secondary | ICD-10-CM | POA: Diagnosis not present

## 2022-01-17 ENCOUNTER — Ambulatory Visit
Admission: RE | Admit: 2022-01-17 | Discharge: 2022-01-17 | Disposition: A | Discharge status: Home / Self Care | Payer: BC Managed Care – PPO | Source: Ambulatory Visit | Attending: Neurosurgery | Admitting: Neurosurgery

## 2022-01-17 DIAGNOSIS — D1800 Hemangioma unspecified site: Secondary | ICD-10-CM

## 2022-01-17 DIAGNOSIS — Z8673 Personal history of transient ischemic attack (TIA), and cerebral infarction without residual deficits: Secondary | ICD-10-CM

## 2022-01-17 DIAGNOSIS — T1512XA Foreign body in conjunctival sac, left eye, initial encounter: Secondary | ICD-10-CM | POA: Diagnosis not present

## 2022-01-17 DIAGNOSIS — R9089 Other abnormal findings on diagnostic imaging of central nervous system: Secondary | ICD-10-CM | POA: Diagnosis not present

## 2022-01-17 MED ORDER — GADOPICLENOL 0.5 MMOL/ML IV SOLN
6.0000 mL | Freq: Once | INTRAVENOUS | Status: AC | PRN
  Administered 2022-01-17: 6 mL via INTRAVENOUS

## 2022-01-28 DIAGNOSIS — Z8673 Personal history of transient ischemic attack (TIA), and cerebral infarction without residual deficits: Secondary | ICD-10-CM | POA: Diagnosis not present

## 2022-02-04 DIAGNOSIS — Z23 Encounter for immunization: Secondary | ICD-10-CM | POA: Diagnosis not present

## 2022-02-13 DIAGNOSIS — R29898 Other symptoms and signs involving the musculoskeletal system: Secondary | ICD-10-CM | POA: Diagnosis not present

## 2022-02-25 DIAGNOSIS — F411 Generalized anxiety disorder: Secondary | ICD-10-CM | POA: Diagnosis not present

## 2022-02-25 DIAGNOSIS — G96191 Perineural cyst: Secondary | ICD-10-CM | POA: Diagnosis not present

## 2022-02-25 DIAGNOSIS — E538 Deficiency of other specified B group vitamins: Secondary | ICD-10-CM | POA: Diagnosis not present

## 2022-02-28 DIAGNOSIS — D485 Neoplasm of uncertain behavior of skin: Secondary | ICD-10-CM | POA: Diagnosis not present

## 2022-03-20 DIAGNOSIS — R140 Abdominal distension (gaseous): Secondary | ICD-10-CM | POA: Diagnosis not present

## 2022-03-20 DIAGNOSIS — R197 Diarrhea, unspecified: Secondary | ICD-10-CM | POA: Diagnosis not present

## 2022-03-20 DIAGNOSIS — R195 Other fecal abnormalities: Secondary | ICD-10-CM | POA: Diagnosis not present

## 2022-03-22 IMAGING — US IR ANGIO/ADRENAL/UNI*L*
1 series · 2 of 2 positions shown · non-contrast
Comparison: Prior MR and CT imaging

INDICATION: 59-year-old with questionable vascular abnormality as a source of
subarachnoid hemorrhage and cord signal abnormality at the T4-T5
level. She presents for diagnostic angiogram

EXAM:
ULTRASOUND-GUIDED ACCESS RIGHT COMMON FEMORAL ARTERY
SPINAL ANGIOGRAM
CELT DEVICE FOR HEMOSTASIS
TECHNIQUE: Informed written consent was obtained from the patient after a
thorough discussion of the procedural risks, benefits and
alternatives. All questions were addressed. Maximal Sterile Barrier
Technique was utilized including caps, mask, sterile gowns, sterile
gloves, sterile drape, hand hygiene and skin antiseptic. The patient
was intubated. A timeout was performed prior to the initiation of
the procedure.

[Series 1: (id) · 2 of 2 slices shown]
[im 1/2]
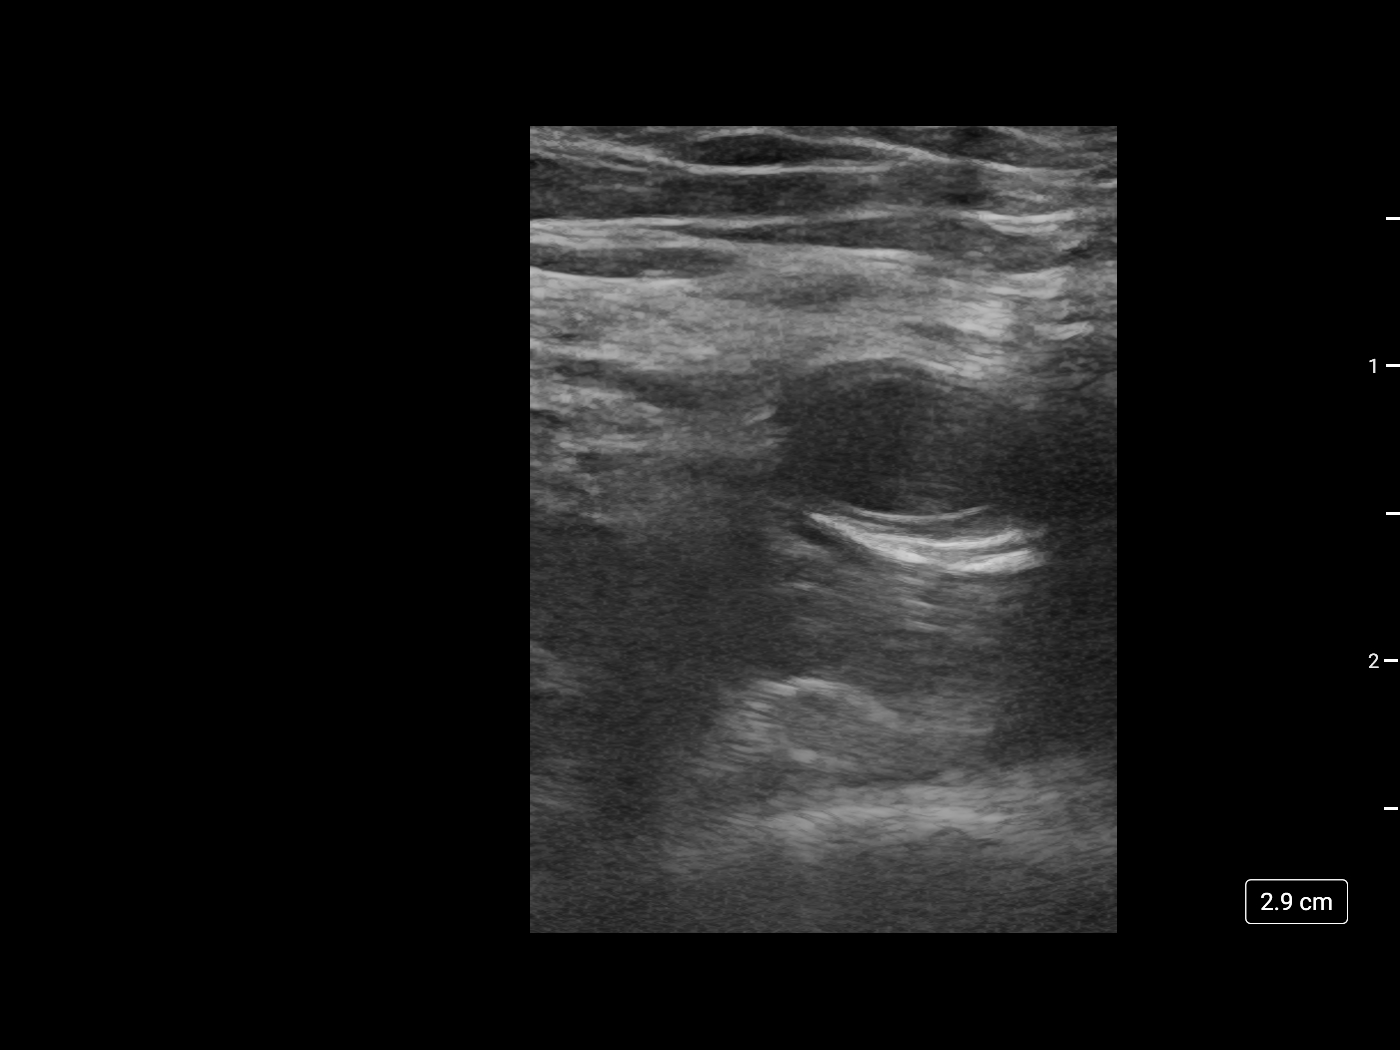
[im 2/2]
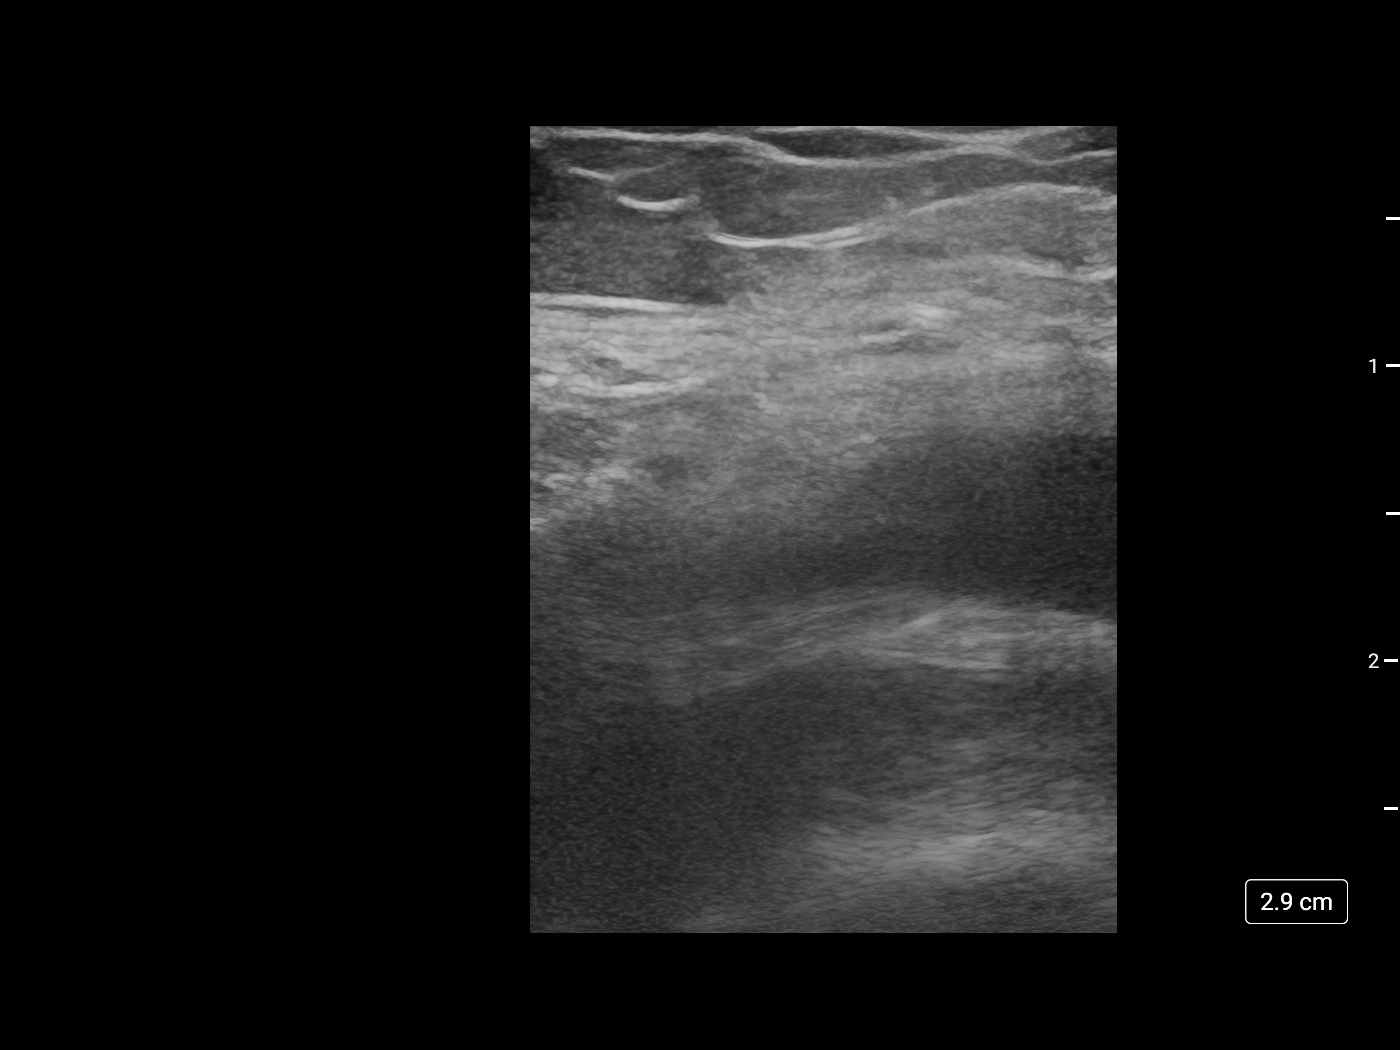

[2 of 2 positions shown; findings below may reference images not displayed]

MEDICATIONS:
None

ANESTHESIA/SEDATION:
The anesthesia team was present to provide general endotracheal tube
anesthesia and for patient monitoring during the procedure.
Intubation was performed in [HOSPITAL]. interventional neuro radiology
nursing staff was also present.

CONTRAST:  280 cc IV contrast

FLUOROSCOPY TIME:  Fluoroscopy Time: 16 minutes 24 seconds (1221
mGy).

COMPLICATIONS:
None
A metric stick with 1 cm and 0.5 cm markings was positioned under
the pad of the table along the midline 4 reference images. On the
initial spot x-ray of thoracic and lumbar regions, the numbers
correspond to the following pedicle levels on the patient left:

T1 = 12 cm

T2 = 14 cm

T3 = 15.5 cm

T4 = 17 cm

T5 = 19 cm

T6 = 21 cm

T7 = 23 cm

T8 = 25.5 cm

T9 = 28 cm

T10 = 30 cm

T11 = 34 cm

T12 = 37 cm
FINDINGS: Aortic angiogram:

Normal course caliber and contour of the distal thoracic aorta. No
aneurysm dissection irregular plaque.

Flash filling of the left subclavian artery.

Patent segmental arteries of the thoracic aorta evident. There is
presence of the dissecting pseudoaneurysm overlying the distal
trachea just above the carina, which arises from vascular supply of
the right T7 segmental artery.

The aortogram demonstrates flash filling of bronchial arteries,
which are not clearly delineated on the aortogram.

Patency of celiac artery confirmed with the aortogram.

T5:

Left segmental artery (run 6, run 7) is patent. No contribution to
radicular medullary flow, with no flow to the territory of the
pseudoaneurysm. There is cross filling into the right-sided
bronchial arteries.

T7:

Right segmental artery (run 8, run 10) is patent, with flow to the
redicular/reticulopial branches of the pseudoaneurysm/dissecting
aneurysm, projecting on the right aspect of the trachea above the
carina at the T5 level. There is no arteriovenous shunting
identified, with no early venous shunting. There is cross flow
filling to the redicular/reticulopial branches and the dorsal spinal
artery branches of the T6 level.

There is a relative absence of reticular medullary venous drainage
in the T5 segment, with some stasis in the draining veins, possibly
related to venous hypertension given the presence of blood products,
versus partial thrombosis of the local radicular medullary drainage.

Left segmental artery (run 12) is patent, with opacification of the
redicular/reticulopial branches, with no cross fill opacification of
the dissecting aneurysm. The left posterior spinal artery is
observed from the left-sided injection. Patent radicular medullary
draining veins.

Left segmental artery (run 26) demonstrates a new non flow limiting
mural thrombus just after the origin of the segmental artery.
Similar appearance of rediculo pial opacification with no engorged
arterial pedicle. No early venous shunting.

Run 29-43 were performed at the right T7 pedicle, some of which from
the base catheter at the ostomy of the artery, and some a which from
microcatheter super selection of the dorsal spinal artery.

These more selective runs again identify the dissecting
pseudoaneurysm, estimated 2 mm-3 mm in transverse diameter, with a
teardrop configuration. The AP images demonstrate that the
pseudoaneurysm overlies what is most likely the posterior spinal
artery on the right, just to the right of the spinous processes in
the midline. A 7 degree right posterior oblique position was also
performed with super selection of the posterior spinal artery (run
41), confirming that there is no engorged arterial pedicle that
supplies the pseudoaneurysm from the posterior spinal artery.
Furthermore, the artery overlies the longitudinal posterior spinal
artery on both the AP and the right posterior oblique DSA,
supporting the location to involve either the posterior spinal
artery directly or a radicular pial artery very close to PSA.

T8:

Left segmental artery (run 13, run 14) is patent. No early venous
shunting or engorged arterial pedicles. There are cross fill
collaterals supplying the radicular branches via posterior spinal
artery at the T8 level, T7 level, T6 level, as well as a second
left-sided T8 segmental branch. Patent reticular medullary venous
drainage.

There is an additional T8 left-sided segmental artery (run 15),
which was partially opacified by the previous injection of the T8
level, with the ostium of the second more caudal artery at the 26 cm
level. No early venous shunting.

Right segmental artery (run 16) is patent, with cross flow into the
more cranial and more caudal segmental branches. Reticular arteries
are filling within normal limits with radicular medullary venous
outflow. No early venous shunting.

T9:

Left segmental artery is patent (run 17) with patent reticular
branches and pedal opacification. Cross fill into the cranial and
caudal segmental branches. No arteriovenous shunting or early vein
opacification. Patent reticular medullary venous drainage. No
engorged veins or engorged arterial pedicles.

Right segmental artery is patent (run 18). Cross fill into the
cranial and caudal segmental branches. No engorged venous drainage.
No engorged arterial pedicle. Patent reticular medullary veins with
expected reticulopial/pedal opacification.

T10:

Right segmental artery is patent (run 19). Opacification of the
cranial and caudal segmental arteries. No engorged arterial pedicle
or venous outflow engorgement. Minimal reticular medullary outflow.
Reticulo pial perfusion

T11:

Left segmental artery is patent (run 20, run 21.) branch
opacification of the cranial segmental arteries via collateral flow.
No engorged arterial pedicle or evidence of shunting.

Right segmental artery is patent (run 22). Opacification via cross
fill of the cranial and caudal segmental arteries. No early venous
shunting. No engorged arterial pedicle.

T12:

Left segmental artery is patent (run 23, 24). This is the origin of
the artery of [place], with opacification of the anterior spinal
artery at this level. No engorged arterial pedicle. No venous
shunting.

Right segmental artery is patent (run 25). There is cross fill to
the more cranial and caudal segmental arteries. Cross flow fills the
radicular medullary artery on the left origin. No venous outflow
abnormality.

Left bronchial artery was directly injected at the T5 level, 20 cm.

PROCEDURE:
Ultrasound survey of the right inguinal region was performed with
images stored and sent to PACs, confirming patency of the vessel.

A micropuncture needle was used access the right common femoral
artery under ultrasound. With excellent arterial blood flow
returned, and an .018 micro wire was passed through the needle,
observed enter the abdominal aorta under fluoroscopy. The needle was
removed, and a micropuncture sheath was placed over the wire. The
inner dilator and wire were removed, and an 035 Bentson wire was
advanced under fluoroscopy into the abdominal aorta. The sheath was
removed and a standard 5 French vascular sheath was placed. The
dilator was removed and the sheath was flushed.

With the wire in the thoracic aorta, pigtail catheter was advanced
over the wire. Pigtail catheter was then advanced to the proximal
descending thoracic aorta. Double flush was performed.

Catheter was then position at the distal aortic arch.

Aortogram was performed.

The catheter was then exchanged for a Mickelson catheter. Mickelson
catheter was formed in the thoracic aorta.

We then started the diagnostic spinal angiogram, at the T4-T5 level,
given findings on prior MRI.

The bilateral segmental arteries were selectively catheterized with
diagnostic angiogram performed at each level from the level of T5 to
the level of T12.

After completing bilateral segmental arterial injection to the T12
level, we then again performed right T7 segmental artery injection.
This was performed with the Mickelson catheter at the ostium. We
then exchanged the Mickelson catheter on the Bentson wire for a JB 1
catheter. The JB 1 catheter was position near the ostium of the
right T7 segmental artery and a rapid transit was used to select the
segmental artery with angiogram, the dorsal spinal artery with
angiogram, and ultimately the prevertebral intersegmental artery.

After the super selective dorsal spinal artery angiogram was
performed through the rapid transit microcatheter and AP and 7
degrees of right posterior oblique position, images were reviewed.

We elected not to do further diagnostic angiogram of vertebral
arteries, external carotid arteries, costoclavicular
trunk/thyrocervical trunk, median sacral artery, and hypogastric
arteries, as the diagnosis was evident by the T7 segmental artery
injection.

All catheters and wires were then removed. The Celt device was then
deployed for hemostasis at the right common femoral artery.

Patient tolerated the procedure well and remained hemodynamically
stable throughout.

No complications were encountered and no significant blood loss.
IMPRESSION: Status post ultrasound guided access right common femoral artery for
diagnostic spinal angiogram, confirming a dissecting aneurysm of
either a right rediculo-pial artery just proximal to the right
posterior spinal artery, or directly involving the right posterior
spinal artery as the etiology of the patient's symptoms and
subarachnoid hemorrhage.

PLAN:
Patient was extubated in the [REDACTED]/surgical status

Target normal systolic blood pressure of 120-140

Right hip straight time 1 hours

Frequent neurovascular checks

## 2022-04-23 DIAGNOSIS — Z03818 Encounter for observation for suspected exposure to other biological agents ruled out: Secondary | ICD-10-CM | POA: Diagnosis not present

## 2022-04-23 DIAGNOSIS — J029 Acute pharyngitis, unspecified: Secondary | ICD-10-CM | POA: Diagnosis not present

## 2022-04-23 DIAGNOSIS — R5383 Other fatigue: Secondary | ICD-10-CM | POA: Diagnosis not present

## 2022-04-23 DIAGNOSIS — E538 Deficiency of other specified B group vitamins: Secondary | ICD-10-CM | POA: Diagnosis not present

## 2022-09-18 DIAGNOSIS — H1033 Unspecified acute conjunctivitis, bilateral: Secondary | ICD-10-CM | POA: Diagnosis not present

## 2022-09-19 DIAGNOSIS — Z681 Body mass index (BMI) 19 or less, adult: Secondary | ICD-10-CM | POA: Diagnosis not present

## 2022-09-19 DIAGNOSIS — Z01419 Encounter for gynecological examination (general) (routine) without abnormal findings: Secondary | ICD-10-CM | POA: Diagnosis not present

## 2022-10-06 DIAGNOSIS — U071 COVID-19: Secondary | ICD-10-CM | POA: Diagnosis not present

## 2022-10-06 DIAGNOSIS — R509 Fever, unspecified: Secondary | ICD-10-CM | POA: Diagnosis not present

## 2022-10-06 DIAGNOSIS — R059 Cough, unspecified: Secondary | ICD-10-CM | POA: Diagnosis not present

## 2022-10-06 DIAGNOSIS — R0981 Nasal congestion: Secondary | ICD-10-CM | POA: Diagnosis not present

## 2022-10-10 DIAGNOSIS — M8588 Other specified disorders of bone density and structure, other site: Secondary | ICD-10-CM | POA: Diagnosis not present

## 2022-10-10 DIAGNOSIS — Z1231 Encounter for screening mammogram for malignant neoplasm of breast: Secondary | ICD-10-CM | POA: Diagnosis not present

## 2022-10-11 DIAGNOSIS — R202 Paresthesia of skin: Secondary | ICD-10-CM | POA: Diagnosis not present

## 2022-10-23 DIAGNOSIS — G039 Meningitis, unspecified: Secondary | ICD-10-CM | POA: Diagnosis not present

## 2022-10-23 DIAGNOSIS — E538 Deficiency of other specified B group vitamins: Secondary | ICD-10-CM | POA: Diagnosis not present

## 2022-10-23 DIAGNOSIS — G96191 Perineural cyst: Secondary | ICD-10-CM | POA: Diagnosis not present

## 2022-11-27 DIAGNOSIS — Z23 Encounter for immunization: Secondary | ICD-10-CM | POA: Diagnosis not present

## 2022-11-27 DIAGNOSIS — E538 Deficiency of other specified B group vitamins: Secondary | ICD-10-CM | POA: Diagnosis not present

## 2022-12-16 DIAGNOSIS — F411 Generalized anxiety disorder: Secondary | ICD-10-CM | POA: Diagnosis not present

## 2022-12-16 DIAGNOSIS — E7800 Pure hypercholesterolemia, unspecified: Secondary | ICD-10-CM | POA: Diagnosis not present

## 2022-12-16 DIAGNOSIS — E538 Deficiency of other specified B group vitamins: Secondary | ICD-10-CM | POA: Diagnosis not present

## 2022-12-16 DIAGNOSIS — Z79899 Other long term (current) drug therapy: Secondary | ICD-10-CM | POA: Diagnosis not present

## 2022-12-16 DIAGNOSIS — M810 Age-related osteoporosis without current pathological fracture: Secondary | ICD-10-CM | POA: Diagnosis not present

## 2022-12-16 DIAGNOSIS — Z0000 Encounter for general adult medical examination without abnormal findings: Secondary | ICD-10-CM | POA: Diagnosis not present

## 2023-01-03 ENCOUNTER — Ambulatory Visit: Payer: BC Managed Care – PPO | Admitting: Cardiovascular Disease

## 2023-01-16 NOTE — Progress Notes (Signed)
  Cardiology Office Note:  .   Date:  01/17/2023  ID:  Taylor Sharp, DOB Apr 24, 1960, MRN [id number] PCP: Daisy Floro, MD  [facility] Providers Cardiologist:  Jethro Radke  }   History of Present Illness: . Nov. 8, 2024   Taylor Sharp is a 62 y.o. female who I saw in 2016 for chest pain   Echo showed normal LVEF of 60%.   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  Feels poorly all the time  No specific chest pain   Is not able to exercise per se Goes to [employer] do leg exercises    When I saw her 8 years ago, she had poor R wave progression  Echo showed normal LV function  Does some water exercises    Fam hx :  Father had CABG, stents, CHF , ablation , CVA  Mother died at 44 from breast cancer M GM died of stroke  PGF - died of MI in his 68       ROS:   Studies Reviewed: .         Risk Assessment/Calculations:             Physical Exam:   VS:  BP 100/80   Pulse 60   Ht 5\' 9"  (1.753 m)   Wt 136 lb (61.7 kg)   BMI 20.08 kg/m    Wt Readings from Last 3 Encounters:  01/17/23 136 lb (61.7 kg)  03/09/20 135 lb (61.2 kg)  03/05/20 135 lb (61.2 kg)    GEN: Well nourished, well developed in no acute distress NECK: No JVD; No carotid bruits CARDIAC: RRR, no murmurs, rubs, gallops RESPIRATORY:  Clear to auscultation without rales, wheezing or rhonchi  ABDOMEN: Soft, non-tender, non-distended EXTREMITIES:  No edema; No deformity   ASSESSMENT AND PLAN: .       Abnormal ECG:   she has poor R wave progression .   Has  had poor R wave progression in previous ECGS  Echo was normal in  2016  Will repeat echo.  If she continues to have normal left ventricular function without any segmental wall motion abnormalities then I will suggest that she needs little to no additional workup.  We can consider getting a coronary calcium score if she would like.  Given her back issues and leg pain/weakness she is really not able to exercise to any great degree.  At times her pain is so severe that she requires a walker or a cane.  Today she walked in on her own.  Will be difficult to give her any specific exercise regimen.  Follow up with me in 2-3 months   Check LP(a) today   Will discuss further work up depending on the result of the echo        Dispo: 2-3 months    Signed, Kristeen Miss, MD

## 2023-01-17 ENCOUNTER — Encounter: Payer: Self-pay | Admitting: Cardiovascular Disease

## 2023-01-17 ENCOUNTER — Ambulatory Visit: Payer: Medicare Other | Attending: Cardiovascular Disease | Admitting: Cardiovascular Disease

## 2023-01-17 VITALS — BP 100/80 | HR 60 | Ht 69.0 in | Wt 136.0 lb

## 2023-01-17 DIAGNOSIS — R9431 Abnormal electrocardiogram [ECG] [EKG]: Secondary | ICD-10-CM | POA: Diagnosis not present

## 2023-01-17 DIAGNOSIS — R0789 Other chest pain: Secondary | ICD-10-CM | POA: Diagnosis not present

## 2023-01-17 DIAGNOSIS — E785 Hyperlipidemia, unspecified: Secondary | ICD-10-CM | POA: Diagnosis not present

## 2023-01-17 DIAGNOSIS — Z79899 Other long term (current) drug therapy: Secondary | ICD-10-CM

## 2023-01-17 NOTE — Patient Instructions (Signed)
Medication Instructions:   Your physician recommends that you continue on your current medications as directed. Please refer to the Current Medication list given to you today.   *If you need a refill on your cardiac medications before your next appointment, please call your pharmacy*   Lab Work:  TODAY--LIPOPROTEIN A  If you have labs (blood work) drawn today and your tests are completely normal, you will receive your results only by: MyChart Message (if you have MyChart) OR A paper copy in the mail If you have any lab test that is abnormal or we need to change your treatment, we will call you to review the results.   Testing/Procedures:  Your physician has requested that you have an echocardiogram. Echocardiography is a painless test that uses sound waves to create images of your heart. It provides your doctor with information about the size and shape of your heart and how well your heart's chambers and valves are working. This procedure takes approximately one hour. There are no restrictions for this procedure. Please do NOT wear cologne, perfume, aftershave, or lotions (deodorant is allowed). Please arrive 15 minutes prior to your appointment time.  Please note: We ask at that you not bring children with you during ultrasound (echo/ vascular) testing. Due to room size and safety concerns, children are not allowed in the ultrasound rooms during exams. Our front office staff cannot provide observation of children in our lobby area while testing is being conducted. An adult accompanying a patient to their appointment will only be allowed in the ultrasound room at the discretion of the ultrasound technician under special circumstances. We apologize for any inconvenience.    Follow-Up:  2-3 MONTHS WITH DR. Elease Hashimoto

## 2023-01-21 LAB — LIPOPROTEIN A (LPA): Lipoprotein (a): <8.4 nmol/L (ref <75.0)

## 2023-02-12 DIAGNOSIS — M9984 Other biomechanical lesions of sacral region: Secondary | ICD-10-CM | POA: Diagnosis not present

## 2023-02-12 DIAGNOSIS — Z8673 Personal history of transient ischemic attack (TIA), and cerebral infarction without residual deficits: Secondary | ICD-10-CM | POA: Diagnosis not present

## 2023-02-12 DIAGNOSIS — G9589 Other specified diseases of spinal cord: Secondary | ICD-10-CM | POA: Diagnosis not present

## 2023-02-12 DIAGNOSIS — M9983 Other biomechanical lesions of lumbar region: Secondary | ICD-10-CM | POA: Diagnosis not present

## 2023-02-12 DIAGNOSIS — R200 Anesthesia of skin: Secondary | ICD-10-CM | POA: Diagnosis not present

## 2023-02-12 DIAGNOSIS — M5126 Other intervertebral disc displacement, lumbar region: Secondary | ICD-10-CM | POA: Diagnosis not present

## 2023-02-12 DIAGNOSIS — G96191 Perineural cyst: Secondary | ICD-10-CM | POA: Diagnosis not present

## 2023-02-12 DIAGNOSIS — R29898 Other symptoms and signs involving the musculoskeletal system: Secondary | ICD-10-CM | POA: Diagnosis not present

## 2023-02-19 ENCOUNTER — Ambulatory Visit (HOSPITAL_COMMUNITY): Payer: Medicare Other | Attending: Cardiovascular Disease

## 2023-02-19 DIAGNOSIS — Z79899 Other long term (current) drug therapy: Secondary | ICD-10-CM | POA: Insufficient documentation

## 2023-02-19 DIAGNOSIS — E785 Hyperlipidemia, unspecified: Secondary | ICD-10-CM | POA: Insufficient documentation

## 2023-02-19 DIAGNOSIS — R0789 Other chest pain: Secondary | ICD-10-CM | POA: Insufficient documentation

## 2023-02-19 DIAGNOSIS — R9431 Abnormal electrocardiogram [ECG] [EKG]: Secondary | ICD-10-CM | POA: Insufficient documentation

## 2023-02-19 LAB — ECHOCARDIOGRAM COMPLETE
Area-P 1/2: 2.52 cm2
S' Lateral: 2.5 cm

## 2023-03-28 DIAGNOSIS — R52 Pain, unspecified: Secondary | ICD-10-CM | POA: Diagnosis not present

## 2023-03-28 DIAGNOSIS — R059 Cough, unspecified: Secondary | ICD-10-CM | POA: Diagnosis not present

## 2023-03-28 DIAGNOSIS — R053 Chronic cough: Secondary | ICD-10-CM | POA: Diagnosis not present

## 2023-03-28 DIAGNOSIS — R5383 Other fatigue: Secondary | ICD-10-CM | POA: Diagnosis not present

## 2023-03-28 DIAGNOSIS — R509 Fever, unspecified: Secondary | ICD-10-CM | POA: Diagnosis not present

## 2023-04-05 ENCOUNTER — Encounter: Payer: Self-pay | Admitting: Cardiovascular Disease

## 2023-04-05 NOTE — Progress Notes (Unsigned)
Cardiology Office Note:  .   Date:  04-13-23  ID:  Taylor Sharp, DOB [birth year], MRN [id number] PCP: Daisy Floro, MD  [facility] Providers Cardiologist:  Bryna Razavi  }   History of Present Illness: . Nov. 8, 2024   Taylor Sharp is a 63 y.o. female who I saw in 2016 for chest pain   Echo showed normal LVEF of 60%.   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  Feels poorly all the time  No specific chest pain   Is not able to exercise per se Goes to [employer] do leg exercises    When I saw her 8 years ago, she had poor R wave progression  Echo showed normal LV function  Does some water exercises    Fam hx :  Father had CABG, stents, CHF , ablation , CVA  Mother died at 33 from breast cancer M GM died of stroke  PGF - died of MI in his 80   04/13/23 Taylor Sharp is seen for follow up of her CP , abnormal ECG Echo from 02/19/23 shows Normal LV systolic function with EF 55-60% Grade I DD Trivial MR  Mild TR   Her leg tingling is stable  Sees the doctors at [facility]  Still having some balance issues  Will give her the name of a physical therapist        ROS:   Studies Reviewed: Marland Kitchen   EKG Interpretation Date/Time:  Monday 13-Apr-2023 10:01:25 EST Ventricular Rate:  61 PR Interval:  162 QRS Duration:  82 QT Interval:  394 QTC Calculation: 396 R Axis:   76  Text Interpretation: Normal sinus rhythm Septal infarct (cited on or before 06-Mar-2020) When compared with ECG of 06-Mar-2020 09:20, Questionable change in initial  forces of Anterior leads Confirmed by Kristeen Miss ([id number]) on 13-Apr-2023 10:10:12 AM     Risk Assessment/Calculations:          Physical Exam:     Physical Exam: Blood pressure 100/70, pulse 75, height 5\' 9"  (1.753 m), weight 138 lb 12.8 oz (63 kg), SpO2 96%.       GEN:  Well nourished, well developed in no acute distress HEENT: Normal NECK: No JVD; No carotid bruits LYMPHATICS: No lymphadenopathy CARDIAC: RRR ,  very soft systolic murmur at LSB .Marland Kitchen RESPIRATORY:  Clear to auscultation without rales, wheezing or rhonchi  ABDOMEN: Soft, non-tender, non-distended MUSCULOSKELETAL:  No edema; No deformity  SKIN: Warm and dry NEUROLOGIC:  Alert and oriented x 3    ASSESSMENT AND PLAN: .       Abnormal ECG:  .  Khaleah has a persistently abnormal EKG with poor R wave progression.  Her echocardiogram shows no wall motion abnormalities.  She still has occasional episodes of chest pain and has had episodes of chest pain the past.  Will get a cardiac MRI to rule out infiltrative processes such as amyloidosis or inflammatory processes such as myocarditis.  I will see her back in several  months for follow-up visit.     Her LP(a)  is normal      Dispo: 2-3 months    Signed, Kristeen Miss, MD

## 2023-04-07 ENCOUNTER — Encounter: Payer: Self-pay | Admitting: Cardiovascular Disease

## 2023-04-07 ENCOUNTER — Ambulatory Visit: Payer: Medicare Other | Attending: Cardiovascular Disease | Admitting: Cardiovascular Disease

## 2023-04-07 VITALS — BP 100/70 | HR 75 | Ht 69.0 in | Wt 138.8 lb

## 2023-04-07 DIAGNOSIS — E785 Hyperlipidemia, unspecified: Secondary | ICD-10-CM | POA: Diagnosis not present

## 2023-04-07 DIAGNOSIS — Z01818 Encounter for other preprocedural examination: Secondary | ICD-10-CM

## 2023-04-07 DIAGNOSIS — R079 Chest pain, unspecified: Secondary | ICD-10-CM | POA: Diagnosis not present

## 2023-04-07 DIAGNOSIS — R9431 Abnormal electrocardiogram [ECG] [EKG]: Secondary | ICD-10-CM

## 2023-04-07 LAB — CBC
Hematocrit: 41.2 % (ref 34.0–46.6)
Hemoglobin: 13.9 g/dL (ref 11.1–15.9)
MCH: 30.5 pg (ref 26.6–33.0)
MCHC: 33.7 g/dL (ref 31.5–35.7)
MCV: 91 fL (ref 79–97)
Platelets: 390 10*3/uL (ref 150–450)
RBC: 4.55 x10E6/uL (ref 3.77–5.28)
RDW: 12.1 % (ref 11.7–15.4)
WBC: 8.6 10*3/uL (ref 3.4–10.8)

## 2023-04-07 NOTE — Patient Instructions (Addendum)
Denice Paradise  ( physical Therapist )  [phone]  Lab Work: CBC today If you have labs (blood work) drawn today and your tests are completely normal, you will receive your results only by: MyChart Message (if you have MyChart) OR A paper copy in the mail If you have any lab test that is abnormal or we need to change your treatment, we will call you to review the results.  Testing/Procedures: Cardiac MRI Your physician has requested that you have a cardiac MRI. Cardiac MRI uses a computer to create images of your heart as its beating, producing both still and moving pictures of your heart and major blood vessels. For further information please visit [email or web address]. Please follow the instruction sheet given to you today for more information.  Follow-Up: At [facility], you and your health needs are our priority.  As part of our continuing mission to provide you with exceptional heart care, we have created designated Provider Care Teams.  These Care Teams include your primary Cardiologist (physician) and Advanced Practice Providers (APPs -  Physician Assistants and Nurse Practitioners) who all work together to provide you with the care you need, when you need it.  We recommend signing up for the patient portal called "MyChart".  Sign up information is provided on this After Visit Summary.  MyChart is used to connect with patients for Virtual Visits (Telemedicine).  Patients are able to view lab/test results, encounter notes, upcoming appointments, etc.  Non-urgent messages can be sent to your provider as well.   To learn more about what you can do with MyChart, go to [email or web address].    Your next appointment:   3 month(s)  Provider:   Kristeen Miss, MD  Other Instructions    Magnetic resonance imaging (MRI) is a painless test that produces images of the inside of the body without using Xrays.  During an MRI, strong magnets and radio waves work together in a  Data processing manager to form detailed images.   MRI images may provide more details about a medical condition than X-rays, CT scans, and ultrasounds can provide.  You may be given earphones to listen for instructions.  You may eat a light breakfast and take medications as ordered with the exception of furosemide, hydrochlorothiazide, chlorthalidone or spironolactone (or any other fluid pill). If you are undergoing a stress MRI, please avoid stimulants for 12 hr prior to test. (I.e. Caffeine, nicotine, chocolate, or antihistamine medications)  An IV will be inserted into one of your veins. Contrast material will be injected into your IV. It will leave your body through your urine within a day. You may be told to drink plenty of fluids to help flush the contrast material out of your system.  You will be asked to remove all metal, including: Watch, jewelry, and other metal objects including hearing aids, hair pieces and dentures. Also wearable glucose monitoring systems (ie. Freestyle Libre and Omnipods) (Braces and fillings normally are not a problem.)   TEST WILL TAKE APPROXIMATELY 1 HOUR

## 2023-04-10 DIAGNOSIS — E538 Deficiency of other specified B group vitamins: Secondary | ICD-10-CM | POA: Diagnosis not present

## 2023-04-22 DIAGNOSIS — R059 Cough, unspecified: Secondary | ICD-10-CM | POA: Diagnosis not present

## 2023-04-22 DIAGNOSIS — R079 Chest pain, unspecified: Secondary | ICD-10-CM | POA: Diagnosis not present

## 2023-05-08 DIAGNOSIS — E538 Deficiency of other specified B group vitamins: Secondary | ICD-10-CM | POA: Diagnosis not present

## 2023-05-21 ENCOUNTER — Other Ambulatory Visit: Payer: Self-pay | Admitting: Cardiovascular Disease

## 2023-05-21 ENCOUNTER — Ambulatory Visit (HOSPITAL_COMMUNITY)
Admission: RE | Admit: 2023-05-21 | Discharge: 2023-05-21 | Disposition: A | Discharge status: Home / Self Care | Payer: Medicare Other | Source: Ambulatory Visit | Attending: Cardiovascular Disease | Admitting: Cardiovascular Disease

## 2023-05-21 DIAGNOSIS — E785 Hyperlipidemia, unspecified: Secondary | ICD-10-CM | POA: Insufficient documentation

## 2023-05-21 DIAGNOSIS — R9431 Abnormal electrocardiogram [ECG] [EKG]: Secondary | ICD-10-CM

## 2023-05-21 DIAGNOSIS — R079 Chest pain, unspecified: Secondary | ICD-10-CM | POA: Diagnosis not present

## 2023-05-21 MED ORDER — GADOBUTROL 1 MMOL/ML IV SOLN
9.5000 mL | Freq: Once | INTRAVENOUS | Status: AC | PRN
  Administered 2023-05-21: 9.5 mL via INTRAVENOUS

## 2023-06-05 DIAGNOSIS — E538 Deficiency of other specified B group vitamins: Secondary | ICD-10-CM | POA: Diagnosis not present

## 2023-07-03 DIAGNOSIS — F430 Acute stress reaction: Secondary | ICD-10-CM | POA: Diagnosis not present

## 2023-07-03 DIAGNOSIS — M5410 Radiculopathy, site unspecified: Secondary | ICD-10-CM | POA: Diagnosis not present

## 2023-07-03 DIAGNOSIS — E538 Deficiency of other specified B group vitamins: Secondary | ICD-10-CM | POA: Diagnosis not present

## 2023-07-06 ENCOUNTER — Encounter: Payer: Self-pay | Admitting: Cardiovascular Disease

## 2023-07-06 NOTE — Progress Notes (Unsigned)
 Cardiology Office Note:  .   Date:  07/07/2023  ID:  Marinda Si, DOB 1960/05/10, MRN [id number] PCP: Jimmey Mould, MD  [facility] Providers Cardiologist:  Jasman Pfeifle  }   History of Present Illness: . Nov. 8, 2024   Keir Kumpf is a 63 y.o. female who I saw in 2016 for chest pain   Echo showed normal LVEF of 60%.   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  Feels poorly all the time  No specific chest pain   Is not able to exercise per se Goes to [employer] do leg exercises    When I saw her 8 years ago, she had poor R wave progression  Echo showed normal LV function  Does some water exercises    Fam hx :  Father had CABG, stents, CHF , ablation , CVA  Mother died at 46 from breast cancer M GM died of stroke  PGF - died of MI in his 17   12-Apr-2023 Morna is seen for follow up of her CP , abnormal ECG Echo from 02/19/23 shows Normal LV systolic function with EF 55-60% Grade I DD Trivial MR  Mild TR   Her leg tingling is stable  Sees the doctors at [facility]  Still having some balance issues  Will give her the name of a physical therapist    July 07, 2023 Mayci is seen for follow up of her CP, abnormal ECG Cardiac MRI shows normal LV EF of 69% No definite LGE was noted although the delayed inhancement images were technically difficult  No evidence of infiltrative cardiac disease or myocarditis   Her biggest issue now is her chronic pain , neuropathy pain     ROS:   Studies Reviewed: .         Risk  Assessment/Calculations:          Physical Exam:    Physical Exam: Blood pressure 116/70, pulse 67, height 5\' 9"  (1.753 m), weight 140 lb (63.5 kg), SpO2 99%.       GEN:  Well nourished, well developed in no acute distress HEENT: Normal NECK: No JVD; No carotid bruits LYMPHATICS: No lymphadenopathy CARDIAC: RRR , soft systolic murmur  RESPIRATORY:  Clear to auscultation without rales, wheezing or rhonchi  ABDOMEN: Soft, non-tender, non-distended MUSCULOSKELETAL:  No edema; No deformity  SKIN: Warm and dry NEUROLOGIC:  Alert and oriented x 3      ASSESSMENT AND PLAN: .       Abnormal ECG:  .  Earline has a persistently abnormal EKG with poor R wave progression.  Her echocardiogram shows no wall motion abnormalities. Cardiac MRI reveals normal LV function.  She has no infiltrative process.  There is no late gadolinium enhancement to suggest a previous myocardial infarction.  We discussed the fact that she will likely have this poor R wave progression with the computer interpretation suggesting an anterior wall myocardial infarction.  I have reassured her that there is no evidence of myocardial infarction,  or infiltrative process such as amyloidosis.   Will plan on seeing her back on an as-needed basis.    Her LP(a)  is normal   2.  Chronic diastolic congestive heart failure: Echocardiogram reveals grade 1 diastolic dysfunction.  We discussed the fact that her blood pressure is normal and that her volume status also is normal.  I do not think she will ever have any significant issues from her diastolic dysfunction.      Signed, Ahmad Alert, MD

## 2023-07-07 ENCOUNTER — Encounter: Payer: Self-pay | Admitting: Cardiovascular Disease

## 2023-07-07 ENCOUNTER — Ambulatory Visit: Payer: Medicare Other | Attending: Cardiovascular Disease | Admitting: Cardiovascular Disease

## 2023-07-07 VITALS — BP 116/70 | HR 67 | Ht 69.0 in | Wt 140.0 lb

## 2023-07-07 DIAGNOSIS — R9431 Abnormal electrocardiogram [ECG] [EKG]: Secondary | ICD-10-CM | POA: Diagnosis not present

## 2023-07-07 DIAGNOSIS — R0789 Other chest pain: Secondary | ICD-10-CM

## 2023-07-07 NOTE — Patient Instructions (Signed)
 Medication Instructions:  Your physician recommends that you continue on your current medications as directed. Please refer to the Current Medication list given to you today. *If you need a refill on your cardiac medications before your next appointment, please call your pharmacy*  Follow-Up: At [facility], you and your health needs are our priority.  As part of our continuing mission to provide you with exceptional heart care, our providers are all part of one team.  This team includes your primary Cardiologist (physician) and Advanced Practice Providers or APPs (Physician Assistants and Nurse Practitioners) who all work together to provide you with the care you need, when you need it.  Your next appointment:   As Needed  Provider:   Dr Alroy Aspen

## 2023-08-05 DIAGNOSIS — E538 Deficiency of other specified B group vitamins: Secondary | ICD-10-CM | POA: Diagnosis not present

## 2023-09-05 DIAGNOSIS — E538 Deficiency of other specified B group vitamins: Secondary | ICD-10-CM | POA: Diagnosis not present

## 2023-10-01 DIAGNOSIS — H524 Presbyopia: Secondary | ICD-10-CM | POA: Diagnosis not present

## 2023-10-16 DIAGNOSIS — Z1231 Encounter for screening mammogram for malignant neoplasm of breast: Secondary | ICD-10-CM | POA: Diagnosis not present

## 2023-10-16 DIAGNOSIS — Z01419 Encounter for gynecological examination (general) (routine) without abnormal findings: Secondary | ICD-10-CM | POA: Diagnosis not present

## 2023-10-16 DIAGNOSIS — Z6820 Body mass index (BMI) 20.0-20.9, adult: Secondary | ICD-10-CM | POA: Diagnosis not present

## 2023-10-27 DIAGNOSIS — M79604 Pain in right leg: Secondary | ICD-10-CM | POA: Diagnosis not present

## 2023-10-27 DIAGNOSIS — M79662 Pain in left lower leg: Secondary | ICD-10-CM | POA: Diagnosis not present

## 2023-10-27 DIAGNOSIS — I87393 Chronic venous hypertension (idiopathic) with other complications of bilateral lower extremity: Secondary | ICD-10-CM | POA: Diagnosis not present

## 2023-10-27 DIAGNOSIS — M79661 Pain in right lower leg: Secondary | ICD-10-CM | POA: Diagnosis not present

## 2023-10-27 DIAGNOSIS — M79605 Pain in left leg: Secondary | ICD-10-CM | POA: Diagnosis not present

## 2023-11-03 DIAGNOSIS — E538 Deficiency of other specified B group vitamins: Secondary | ICD-10-CM | POA: Diagnosis not present

## 2023-12-15 DIAGNOSIS — E538 Deficiency of other specified B group vitamins: Secondary | ICD-10-CM | POA: Diagnosis not present

## 2023-12-25 DIAGNOSIS — Z79899 Other long term (current) drug therapy: Secondary | ICD-10-CM | POA: Diagnosis not present

## 2023-12-25 DIAGNOSIS — E7800 Pure hypercholesterolemia, unspecified: Secondary | ICD-10-CM | POA: Diagnosis not present

## 2023-12-25 DIAGNOSIS — G8929 Other chronic pain: Secondary | ICD-10-CM | POA: Diagnosis not present

## 2023-12-25 DIAGNOSIS — F411 Generalized anxiety disorder: Secondary | ICD-10-CM | POA: Diagnosis not present

## 2023-12-25 DIAGNOSIS — Z0000 Encounter for general adult medical examination without abnormal findings: Secondary | ICD-10-CM | POA: Diagnosis not present

## 2023-12-25 DIAGNOSIS — J309 Allergic rhinitis, unspecified: Secondary | ICD-10-CM | POA: Diagnosis not present

## 2023-12-25 DIAGNOSIS — E538 Deficiency of other specified B group vitamins: Secondary | ICD-10-CM | POA: Diagnosis not present

## 2024-02-16 DIAGNOSIS — G9511 Acute infarction of spinal cord (embolic) (nonembolic): Secondary | ICD-10-CM | POA: Diagnosis not present
